# Patient Record
Sex: Female | Born: 1940 | Race: Black or African American | Hispanic: No | Smoking: Never smoker
Health system: Southern US, Community
[De-identification: ages and names within clinical notes are randomized; demographics above are authoritative.]

## PROBLEM LIST (undated history)

## (undated) DIAGNOSIS — N200 Calculus of kidney: Secondary | ICD-10-CM

## (undated) DIAGNOSIS — G4730 Sleep apnea, unspecified: Secondary | ICD-10-CM

## (undated) DIAGNOSIS — I351 Nonrheumatic aortic (valve) insufficiency: Secondary | ICD-10-CM

## (undated) DIAGNOSIS — G309 Alzheimer's disease, unspecified: Secondary | ICD-10-CM

## (undated) DIAGNOSIS — I5189 Other ill-defined heart diseases: Secondary | ICD-10-CM

## (undated) DIAGNOSIS — I10 Essential (primary) hypertension: Secondary | ICD-10-CM

## (undated) DIAGNOSIS — K219 Gastro-esophageal reflux disease without esophagitis: Secondary | ICD-10-CM

## (undated) DIAGNOSIS — F0280 Dementia in other diseases classified elsewhere without behavioral disturbance: Secondary | ICD-10-CM

## (undated) DIAGNOSIS — M1710 Unilateral primary osteoarthritis, unspecified knee: Secondary | ICD-10-CM

## (undated) DIAGNOSIS — I77810 Thoracic aortic ectasia: Secondary | ICD-10-CM

## (undated) DIAGNOSIS — I872 Venous insufficiency (chronic) (peripheral): Secondary | ICD-10-CM

## (undated) DIAGNOSIS — C50919 Malignant neoplasm of unspecified site of unspecified female breast: Secondary | ICD-10-CM

## (undated) DIAGNOSIS — R9431 Abnormal electrocardiogram [ECG] [EKG]: Secondary | ICD-10-CM

## (undated) HISTORY — DX: Gastro-esophageal reflux disease without esophagitis: K21.9

## (undated) HISTORY — DX: Malignant neoplasm of unspecified site of unspecified female breast: C50.919

## (undated) HISTORY — PX: MASTECTOMY: SHX3

## (undated) HISTORY — DX: Nonrheumatic aortic (valve) insufficiency: I35.1

## (undated) HISTORY — PX: KIDNEY STONE SURGERY: SHX686

## (undated) HISTORY — DX: Calculus of kidney: N20.0

## (undated) HISTORY — DX: Unilateral primary osteoarthritis, unspecified knee: M17.10

## (undated) HISTORY — PX: DILATION AND CURETTAGE OF UTERUS: SHX78

## (undated) HISTORY — DX: Sleep apnea, unspecified: G47.30

## (undated) HISTORY — DX: Thoracic aortic ectasia: I77.810

## (undated) HISTORY — DX: Abnormal electrocardiogram (ECG) (EKG): R94.31

## (undated) HISTORY — DX: Morbid (severe) obesity due to excess calories: E66.01

## (undated) HISTORY — PX: TRACHEOSTOMY: SUR1362

## (undated) HISTORY — DX: Essential (primary) hypertension: I10

## (undated) HISTORY — DX: Venous insufficiency (chronic) (peripheral): I87.2

## (undated) HISTORY — DX: Other ill-defined heart diseases: I51.89

## (undated) HISTORY — PX: ECTOPIC PREGNANCY SURGERY: SHX613

## (undated) HISTORY — DX: Alzheimer's disease, unspecified: G30.9

## (undated) HISTORY — DX: Dementia in other diseases classified elsewhere without behavioral disturbance: F02.80

---

## 1998-02-28 ENCOUNTER — Emergency Department (HOSPITAL_COMMUNITY): Admission: EM | Admit: 1998-02-28 | Discharge: 1998-02-28 | Payer: Self-pay | Admitting: Emergency Medicine

## 1998-03-29 ENCOUNTER — Other Ambulatory Visit: Admission: RE | Admit: 1998-03-29 | Discharge: 1998-03-29 | Payer: Self-pay | Admitting: Family Medicine

## 1999-02-11 ENCOUNTER — Emergency Department (HOSPITAL_COMMUNITY): Admission: EM | Admit: 1999-02-11 | Discharge: 1999-02-11 | Payer: Self-pay | Admitting: Emergency Medicine

## 1999-02-11 ENCOUNTER — Encounter: Payer: Self-pay | Admitting: Emergency Medicine

## 1999-02-12 ENCOUNTER — Ambulatory Visit (HOSPITAL_COMMUNITY): Admission: RE | Admit: 1999-02-12 | Discharge: 1999-02-12 | Payer: Self-pay | Admitting: Emergency Medicine

## 1999-02-25 ENCOUNTER — Ambulatory Visit (HOSPITAL_COMMUNITY): Admission: RE | Admit: 1999-02-25 | Discharge: 1999-02-25 | Payer: Self-pay | Admitting: Surgery

## 1999-02-25 ENCOUNTER — Encounter: Payer: Self-pay | Admitting: Surgery

## 1999-04-08 ENCOUNTER — Ambulatory Visit: Admission: RE | Admit: 1999-04-08 | Discharge: 1999-04-08 | Payer: Self-pay | Admitting: Urology

## 1999-04-08 ENCOUNTER — Encounter: Payer: Self-pay | Admitting: Urology

## 2000-03-24 ENCOUNTER — Encounter: Payer: Self-pay | Admitting: Urology

## 2000-03-24 ENCOUNTER — Ambulatory Visit (HOSPITAL_COMMUNITY): Admission: RE | Admit: 2000-03-24 | Discharge: 2000-03-24 | Payer: Self-pay | Admitting: Urology

## 2000-08-11 ENCOUNTER — Ambulatory Visit (HOSPITAL_COMMUNITY): Admission: RE | Admit: 2000-08-11 | Discharge: 2000-08-11 | Payer: Self-pay | Admitting: Family Medicine

## 2000-08-31 ENCOUNTER — Other Ambulatory Visit: Admission: RE | Admit: 2000-08-31 | Discharge: 2000-08-31 | Payer: Self-pay | Admitting: Family Medicine

## 2001-02-23 ENCOUNTER — Emergency Department (HOSPITAL_COMMUNITY): Admission: EM | Admit: 2001-02-23 | Discharge: 2001-02-23 | Payer: Self-pay | Admitting: Emergency Medicine

## 2001-03-14 ENCOUNTER — Ambulatory Visit (HOSPITAL_BASED_OUTPATIENT_CLINIC_OR_DEPARTMENT_OTHER): Admission: RE | Admit: 2001-03-14 | Discharge: 2001-03-14 | Payer: Self-pay | Admitting: Family Medicine

## 2001-03-30 ENCOUNTER — Emergency Department (HOSPITAL_COMMUNITY): Admission: EM | Admit: 2001-03-30 | Discharge: 2001-03-30 | Payer: Self-pay | Admitting: Emergency Medicine

## 2001-04-01 ENCOUNTER — Encounter: Payer: Self-pay | Admitting: Family Medicine

## 2001-04-01 ENCOUNTER — Ambulatory Visit (HOSPITAL_COMMUNITY): Admission: RE | Admit: 2001-04-01 | Discharge: 2001-04-01 | Payer: Self-pay | Admitting: Family Medicine

## 2001-04-04 ENCOUNTER — Ambulatory Visit (HOSPITAL_BASED_OUTPATIENT_CLINIC_OR_DEPARTMENT_OTHER): Admission: RE | Admit: 2001-04-04 | Discharge: 2001-04-04 | Payer: Self-pay | Admitting: Family Medicine

## 2001-08-10 ENCOUNTER — Encounter: Payer: Self-pay | Admitting: Emergency Medicine

## 2001-08-10 ENCOUNTER — Inpatient Hospital Stay (HOSPITAL_COMMUNITY): Admission: EM | Admit: 2001-08-10 | Discharge: 2001-08-18 | Payer: Self-pay | Admitting: Emergency Medicine

## 2001-08-16 ENCOUNTER — Encounter: Payer: Self-pay | Admitting: Internal Medicine

## 2001-09-14 ENCOUNTER — Ambulatory Visit (HOSPITAL_COMMUNITY): Admission: RE | Admit: 2001-09-14 | Discharge: 2001-09-14 | Payer: Self-pay | Admitting: Family Medicine

## 2001-09-28 ENCOUNTER — Encounter: Payer: Self-pay | Admitting: Family Medicine

## 2001-09-28 ENCOUNTER — Ambulatory Visit (HOSPITAL_COMMUNITY): Admission: RE | Admit: 2001-09-28 | Discharge: 2001-09-28 | Payer: Self-pay | Admitting: Family Medicine

## 2002-02-01 ENCOUNTER — Emergency Department (HOSPITAL_COMMUNITY): Admission: EM | Admit: 2002-02-01 | Discharge: 2002-02-01 | Payer: Self-pay | Admitting: Emergency Medicine

## 2003-06-23 ENCOUNTER — Encounter: Payer: Self-pay | Admitting: Internal Medicine

## 2003-06-23 ENCOUNTER — Encounter: Admission: RE | Admit: 2003-06-23 | Discharge: 2003-06-23 | Payer: Self-pay | Admitting: Internal Medicine

## 2003-10-06 ENCOUNTER — Encounter: Admission: RE | Admit: 2003-10-06 | Discharge: 2003-10-06 | Payer: Self-pay | Admitting: Internal Medicine

## 2003-10-06 ENCOUNTER — Encounter (INDEPENDENT_AMBULATORY_CARE_PROVIDER_SITE_OTHER): Payer: Self-pay | Admitting: *Deleted

## 2003-10-06 ENCOUNTER — Encounter (INDEPENDENT_AMBULATORY_CARE_PROVIDER_SITE_OTHER): Payer: Self-pay | Admitting: Radiology

## 2003-10-17 ENCOUNTER — Encounter (INDEPENDENT_AMBULATORY_CARE_PROVIDER_SITE_OTHER): Payer: Self-pay | Admitting: *Deleted

## 2003-10-17 ENCOUNTER — Ambulatory Visit (HOSPITAL_COMMUNITY): Admission: RE | Admit: 2003-10-17 | Discharge: 2003-10-17 | Payer: Self-pay | Admitting: General Surgery

## 2003-11-28 ENCOUNTER — Encounter (INDEPENDENT_AMBULATORY_CARE_PROVIDER_SITE_OTHER): Payer: Self-pay | Admitting: Specialist

## 2003-11-28 ENCOUNTER — Inpatient Hospital Stay (HOSPITAL_COMMUNITY): Admission: RE | Admit: 2003-11-28 | Discharge: 2003-11-30 | Payer: Self-pay | Admitting: General Surgery

## 2003-12-09 ENCOUNTER — Emergency Department (HOSPITAL_COMMUNITY): Admission: AD | Admit: 2003-12-09 | Discharge: 2003-12-10 | Payer: Self-pay | Admitting: Emergency Medicine

## 2003-12-09 ENCOUNTER — Emergency Department (HOSPITAL_COMMUNITY): Admission: EM | Admit: 2003-12-09 | Discharge: 2003-12-09 | Payer: Self-pay | Admitting: Emergency Medicine

## 2004-01-05 ENCOUNTER — Ambulatory Visit (HOSPITAL_COMMUNITY): Admission: RE | Admit: 2004-01-05 | Discharge: 2004-01-05 | Payer: Self-pay | Admitting: Oncology

## 2004-01-15 ENCOUNTER — Ambulatory Visit: Admission: RE | Admit: 2004-01-15 | Discharge: 2004-02-20 | Payer: Self-pay | Admitting: *Deleted

## 2004-01-23 ENCOUNTER — Encounter: Admission: RE | Admit: 2004-01-23 | Discharge: 2004-01-23 | Payer: Self-pay | Admitting: Oncology

## 2004-05-22 ENCOUNTER — Ambulatory Visit (HOSPITAL_COMMUNITY): Admission: RE | Admit: 2004-05-22 | Discharge: 2004-05-22 | Payer: Self-pay | Admitting: Oncology

## 2004-05-28 ENCOUNTER — Encounter: Admission: RE | Admit: 2004-05-28 | Discharge: 2004-07-19 | Payer: Self-pay | Admitting: Oncology

## 2004-08-06 ENCOUNTER — Emergency Department (HOSPITAL_COMMUNITY): Admission: EM | Admit: 2004-08-06 | Discharge: 2004-08-06 | Payer: Self-pay | Admitting: Emergency Medicine

## 2004-09-10 ENCOUNTER — Ambulatory Visit: Payer: Self-pay | Admitting: Oncology

## 2004-10-06 ENCOUNTER — Emergency Department (HOSPITAL_COMMUNITY): Admission: EM | Admit: 2004-10-06 | Discharge: 2004-10-06 | Payer: Self-pay | Admitting: Emergency Medicine

## 2004-10-07 ENCOUNTER — Encounter: Admission: RE | Admit: 2004-10-07 | Discharge: 2004-10-07 | Payer: Self-pay | Admitting: Oncology

## 2004-12-12 ENCOUNTER — Ambulatory Visit: Payer: Self-pay | Admitting: Oncology

## 2005-04-24 ENCOUNTER — Ambulatory Visit: Payer: Self-pay | Admitting: Oncology

## 2005-08-21 ENCOUNTER — Ambulatory Visit: Payer: Self-pay | Admitting: Oncology

## 2005-10-03 ENCOUNTER — Ambulatory Visit (HOSPITAL_COMMUNITY): Admission: RE | Admit: 2005-10-03 | Discharge: 2005-10-03 | Payer: Self-pay | Admitting: Gastroenterology

## 2005-11-20 ENCOUNTER — Encounter: Admission: RE | Admit: 2005-11-20 | Discharge: 2005-11-20 | Payer: Self-pay | Admitting: Oncology

## 2006-01-30 ENCOUNTER — Ambulatory Visit: Payer: Self-pay | Admitting: Oncology

## 2006-05-25 ENCOUNTER — Ambulatory Visit: Payer: Self-pay | Admitting: Oncology

## 2006-06-16 LAB — CBC WITH DIFFERENTIAL/PLATELET
Basophils Absolute: 0 10*3/uL (ref 0.0–0.1)
EOS%: 1.9 % (ref 0.0–7.0)
HCT: 41 % (ref 34.8–46.6)
HGB: 14.3 g/dL (ref 11.6–15.9)
LYMPH%: 26.4 % (ref 14.0–48.0)
MCH: 31.7 pg (ref 26.0–34.0)
MCV: 90.8 fL (ref 81.0–101.0)
MONO%: 10.4 % (ref 0.0–13.0)
NEUT%: 60.7 % (ref 39.6–76.8)

## 2006-06-17 LAB — COMPREHENSIVE METABOLIC PANEL
AST: 25 U/L (ref 0–37)
Alkaline Phosphatase: 91 U/L (ref 39–117)
BUN: 11 mg/dL (ref 6–23)
Calcium: 9.6 mg/dL (ref 8.4–10.5)
Creatinine, Ser: 0.77 mg/dL (ref 0.40–1.20)
Total Bilirubin: 1.2 mg/dL (ref 0.3–1.2)

## 2006-06-17 LAB — CANCER ANTIGEN 27.29: CA 27.29: 9 U/mL (ref 0–39)

## 2006-06-17 LAB — LACTATE DEHYDROGENASE: LDH: 186 U/L (ref 94–250)

## 2006-10-08 ENCOUNTER — Ambulatory Visit: Payer: Self-pay | Admitting: Oncology

## 2006-11-26 ENCOUNTER — Ambulatory Visit: Payer: Self-pay | Admitting: Oncology

## 2006-12-01 LAB — CBC WITH DIFFERENTIAL/PLATELET
BASO%: 0.6 % (ref 0.0–2.0)
Basophils Absolute: 0 10*3/uL (ref 0.0–0.1)
EOS%: 2.4 % (ref 0.0–7.0)
HGB: 14.1 g/dL (ref 11.6–15.9)
MCH: 31.4 pg (ref 26.0–34.0)
MCHC: 34 g/dL (ref 32.0–36.0)
RDW: 13 % (ref 11.3–14.5)
lymph#: 1.2 10*3/uL (ref 0.9–3.3)

## 2006-12-01 LAB — COMPREHENSIVE METABOLIC PANEL
ALT: 13 U/L (ref 0–35)
AST: 22 U/L (ref 0–37)
Albumin: 4.1 g/dL (ref 3.5–5.2)
Calcium: 9.4 mg/dL (ref 8.4–10.5)
Chloride: 104 mEq/L (ref 96–112)
Potassium: 3.9 mEq/L (ref 3.5–5.3)

## 2006-12-02 ENCOUNTER — Encounter: Admission: RE | Admit: 2006-12-02 | Discharge: 2006-12-02 | Payer: Self-pay | Admitting: Oncology

## 2007-01-25 ENCOUNTER — Encounter: Admission: RE | Admit: 2007-01-25 | Discharge: 2007-01-25 | Payer: Self-pay | Admitting: Oncology

## 2007-03-22 ENCOUNTER — Ambulatory Visit: Payer: Self-pay | Admitting: Oncology

## 2007-03-31 LAB — CBC WITH DIFFERENTIAL/PLATELET
Eosinophils Absolute: 0.1 10*3/uL (ref 0.0–0.5)
LYMPH%: 22.5 % (ref 14.0–48.0)
MCH: 31.7 pg (ref 26.0–34.0)
MCV: 89.6 fL (ref 81.0–101.0)
MONO%: 9.7 % (ref 0.0–13.0)
NEUT#: 3 10*3/uL (ref 1.5–6.5)
Platelets: 225 10*3/uL (ref 145–400)
RBC: 4.41 10*6/uL (ref 3.70–5.32)

## 2007-03-31 LAB — COMPREHENSIVE METABOLIC PANEL
Alkaline Phosphatase: 83 U/L (ref 39–117)
BUN: 14 mg/dL (ref 6–23)
Glucose, Bld: 117 mg/dL — ABNORMAL HIGH (ref 70–99)
Sodium: 141 mEq/L (ref 135–145)
Total Bilirubin: 0.9 mg/dL (ref 0.3–1.2)
Total Protein: 6.7 g/dL (ref 6.0–8.3)

## 2007-03-31 LAB — CANCER ANTIGEN 27.29: CA 27.29: 10 U/mL (ref 0–39)

## 2007-05-17 DIAGNOSIS — F0280 Dementia in other diseases classified elsewhere without behavioral disturbance: Secondary | ICD-10-CM

## 2007-05-17 DIAGNOSIS — G309 Alzheimer's disease, unspecified: Secondary | ICD-10-CM

## 2007-05-17 HISTORY — DX: Dementia in other diseases classified elsewhere, unspecified severity, without behavioral disturbance, psychotic disturbance, mood disturbance, and anxiety: F02.80

## 2007-09-17 ENCOUNTER — Ambulatory Visit: Payer: Self-pay | Admitting: Oncology

## 2007-09-22 LAB — CBC WITH DIFFERENTIAL/PLATELET
Basophils Absolute: 0 10*3/uL (ref 0.0–0.1)
Eosinophils Absolute: 0.1 10*3/uL (ref 0.0–0.5)
HCT: 40.5 % (ref 34.8–46.6)
HGB: 14.2 g/dL (ref 11.6–15.9)
MONO#: 0.4 10*3/uL (ref 0.1–0.9)
NEUT%: 57.5 % (ref 39.6–76.8)
WBC: 4.9 10*3/uL (ref 3.9–10.0)
lymph#: 1.5 10*3/uL (ref 0.9–3.3)

## 2007-09-22 LAB — LACTATE DEHYDROGENASE: LDH: 164 U/L (ref 94–250)

## 2007-09-22 LAB — COMPREHENSIVE METABOLIC PANEL
Albumin: 4 g/dL (ref 3.5–5.2)
CO2: 23 mEq/L (ref 19–32)
Glucose, Bld: 118 mg/dL — ABNORMAL HIGH (ref 70–99)
Potassium: 3.7 mEq/L (ref 3.5–5.3)
Sodium: 139 mEq/L (ref 135–145)
Total Protein: 6.9 g/dL (ref 6.0–8.3)

## 2007-09-22 LAB — CANCER ANTIGEN 27.29: CA 27.29: 10 U/mL (ref 0–39)

## 2007-12-07 ENCOUNTER — Encounter: Admission: RE | Admit: 2007-12-07 | Discharge: 2007-12-07 | Payer: Self-pay | Admitting: Oncology

## 2008-04-26 ENCOUNTER — Ambulatory Visit: Payer: Self-pay | Admitting: Oncology

## 2008-04-28 LAB — CBC WITH DIFFERENTIAL/PLATELET
Eosinophils Absolute: 0.1 10*3/uL (ref 0.0–0.5)
MCV: 90.4 fL (ref 81.0–101.0)
MONO%: 8.7 % (ref 0.0–13.0)
NEUT#: 3.3 10*3/uL (ref 1.5–6.5)
RBC: 4.47 10*6/uL (ref 3.70–5.32)
RDW: 12.2 % (ref 11.3–14.5)
WBC: 6.2 10*3/uL (ref 3.9–10.0)
lymph#: 2.2 10*3/uL (ref 0.9–3.3)

## 2008-05-01 LAB — COMPREHENSIVE METABOLIC PANEL
AST: 21 U/L (ref 0–37)
Albumin: 3.8 g/dL (ref 3.5–5.2)
Alkaline Phosphatase: 84 U/L (ref 39–117)
Calcium: 9 mg/dL (ref 8.4–10.5)
Chloride: 105 mEq/L (ref 96–112)
Glucose, Bld: 101 mg/dL — ABNORMAL HIGH (ref 70–99)
Potassium: 3.7 mEq/L (ref 3.5–5.3)
Sodium: 139 mEq/L (ref 135–145)
Total Protein: 6.6 g/dL (ref 6.0–8.3)

## 2008-05-16 ENCOUNTER — Emergency Department (HOSPITAL_COMMUNITY): Admission: EM | Admit: 2008-05-16 | Discharge: 2008-05-17 | Payer: Self-pay | Admitting: Emergency Medicine

## 2008-06-29 ENCOUNTER — Encounter: Admission: RE | Admit: 2008-06-29 | Discharge: 2008-09-11 | Payer: Self-pay | Admitting: Orthopaedic Surgery

## 2008-10-23 ENCOUNTER — Ambulatory Visit: Payer: Self-pay | Admitting: Oncology

## 2008-10-25 LAB — CBC WITH DIFFERENTIAL/PLATELET
Basophils Absolute: 0 10*3/uL (ref 0.0–0.1)
Eosinophils Absolute: 0.1 10*3/uL (ref 0.0–0.5)
HCT: 39.9 % (ref 34.8–46.6)
LYMPH%: 38.6 % (ref 14.0–48.0)
MCV: 91.8 fL (ref 81.0–101.0)
MONO#: 0.4 10*3/uL (ref 0.1–0.9)
MONO%: 8.2 % (ref 0.0–13.0)
NEUT#: 2.5 10*3/uL (ref 1.5–6.5)
NEUT%: 50.6 % (ref 39.6–76.8)
Platelets: 198 10*3/uL (ref 145–400)
WBC: 4.9 10*3/uL (ref 3.9–10.0)

## 2008-10-26 LAB — COMPREHENSIVE METABOLIC PANEL
Alkaline Phosphatase: 76 U/L (ref 39–117)
BUN: 11 mg/dL (ref 6–23)
CO2: 24 mEq/L (ref 19–32)
Creatinine, Ser: 0.66 mg/dL (ref 0.40–1.20)
Glucose, Bld: 124 mg/dL — ABNORMAL HIGH (ref 70–99)
Total Bilirubin: 0.8 mg/dL (ref 0.3–1.2)
Total Protein: 6.7 g/dL (ref 6.0–8.3)

## 2008-10-26 LAB — CANCER ANTIGEN 27.29: CA 27.29: 9 U/mL (ref 0–39)

## 2008-10-26 LAB — VITAMIN D 25 HYDROXY (VIT D DEFICIENCY, FRACTURES): Vit D, 25-Hydroxy: 43 ng/mL (ref 30–89)

## 2008-10-26 LAB — LACTATE DEHYDROGENASE: LDH: 143 U/L (ref 94–250)

## 2008-12-15 ENCOUNTER — Ambulatory Visit (HOSPITAL_COMMUNITY): Admission: RE | Admit: 2008-12-15 | Discharge: 2008-12-15 | Payer: Self-pay | Admitting: Internal Medicine

## 2009-10-24 ENCOUNTER — Ambulatory Visit: Payer: Self-pay | Admitting: Oncology

## 2009-10-30 LAB — COMPREHENSIVE METABOLIC PANEL
ALT: 18 U/L (ref 0–35)
AST: 31 U/L (ref 0–37)
CO2: 29 mEq/L (ref 19–32)
Chloride: 101 mEq/L (ref 96–112)
Creatinine, Ser: 0.74 mg/dL (ref 0.40–1.20)
Sodium: 136 mEq/L (ref 135–145)
Total Bilirubin: 1.1 mg/dL (ref 0.3–1.2)
Total Protein: 6.5 g/dL (ref 6.0–8.3)

## 2009-10-30 LAB — CBC WITH DIFFERENTIAL/PLATELET
BASO%: 0.6 % (ref 0.0–2.0)
EOS%: 1.8 % (ref 0.0–7.0)
LYMPH%: 51.4 % — ABNORMAL HIGH (ref 14.0–49.7)
MCH: 31.6 pg (ref 25.1–34.0)
MCHC: 34 g/dL (ref 31.5–36.0)
MONO#: 0.4 10*3/uL (ref 0.1–0.9)
NEUT%: 35.5 % — ABNORMAL LOW (ref 38.4–76.8)
RBC: 4.48 10*6/uL (ref 3.70–5.45)
WBC: 3.4 10*3/uL — ABNORMAL LOW (ref 3.9–10.3)
lymph#: 1.8 10*3/uL (ref 0.9–3.3)

## 2009-10-30 LAB — LACTATE DEHYDROGENASE: LDH: 148 U/L (ref 94–250)

## 2009-12-17 ENCOUNTER — Ambulatory Visit (HOSPITAL_COMMUNITY): Admission: RE | Admit: 2009-12-17 | Discharge: 2009-12-17 | Payer: Self-pay | Admitting: Oncology

## 2010-10-30 ENCOUNTER — Ambulatory Visit (HOSPITAL_BASED_OUTPATIENT_CLINIC_OR_DEPARTMENT_OTHER): Payer: Medicare Other | Admitting: Oncology

## 2010-11-23 ENCOUNTER — Encounter: Payer: Self-pay | Admitting: Oncology

## 2010-11-23 ENCOUNTER — Encounter: Payer: Self-pay | Admitting: General Surgery

## 2010-11-23 ENCOUNTER — Encounter: Payer: Self-pay | Admitting: Internal Medicine

## 2010-11-24 ENCOUNTER — Encounter: Payer: Self-pay | Admitting: Internal Medicine

## 2010-11-29 ENCOUNTER — Other Ambulatory Visit: Payer: Self-pay | Admitting: Oncology

## 2010-11-29 DIAGNOSIS — Z1231 Encounter for screening mammogram for malignant neoplasm of breast: Secondary | ICD-10-CM

## 2010-11-29 DIAGNOSIS — Z139 Encounter for screening, unspecified: Secondary | ICD-10-CM

## 2010-12-20 ENCOUNTER — Ambulatory Visit (HOSPITAL_COMMUNITY): Admission: RE | Admit: 2010-12-20 | Payer: Medicare Other | Source: Ambulatory Visit

## 2010-12-23 ENCOUNTER — Encounter (HOSPITAL_COMMUNITY): Payer: Self-pay

## 2010-12-23 ENCOUNTER — Ambulatory Visit (HOSPITAL_COMMUNITY)
Admission: RE | Admit: 2010-12-23 | Discharge: 2010-12-23 | Disposition: A | Discharge status: Home / Self Care | Payer: Medicare Other | Source: Ambulatory Visit | Attending: Oncology | Admitting: Oncology

## 2010-12-23 DIAGNOSIS — Z1231 Encounter for screening mammogram for malignant neoplasm of breast: Secondary | ICD-10-CM | POA: Insufficient documentation

## 2011-01-09 ENCOUNTER — Encounter (HOSPITAL_BASED_OUTPATIENT_CLINIC_OR_DEPARTMENT_OTHER): Payer: Medicare Other | Admitting: Oncology

## 2011-01-09 ENCOUNTER — Other Ambulatory Visit: Payer: Self-pay | Admitting: Oncology

## 2011-01-09 DIAGNOSIS — Z170 Estrogen receptor positive status [ER+]: Secondary | ICD-10-CM

## 2011-01-09 DIAGNOSIS — C50219 Malignant neoplasm of upper-inner quadrant of unspecified female breast: Secondary | ICD-10-CM

## 2011-01-09 DIAGNOSIS — E559 Vitamin D deficiency, unspecified: Secondary | ICD-10-CM

## 2011-01-09 DIAGNOSIS — Z9010 Acquired absence of unspecified breast and nipple: Secondary | ICD-10-CM

## 2011-01-09 LAB — CBC WITH DIFFERENTIAL/PLATELET
BASO%: 1 % (ref 0.0–2.0)
Basophils Absolute: 0 10*3/uL (ref 0.0–0.1)
EOS%: 1.6 % (ref 0.0–7.0)
Eosinophils Absolute: 0.1 10*3/uL (ref 0.0–0.5)
HCT: 41.1 % (ref 34.8–46.6)
HGB: 13.9 g/dL (ref 11.6–15.9)
LYMPH%: 36.8 % (ref 14.0–49.7)
MCH: 31.4 pg (ref 25.1–34.0)
MCHC: 33.9 g/dL (ref 31.5–36.0)
MCV: 92.7 fL (ref 79.5–101.0)
MONO#: 0.4 10*3/uL (ref 0.1–0.9)
MONO%: 8.7 % (ref 0.0–14.0)
NEUT#: 2.5 10*3/uL (ref 1.5–6.5)
NEUT%: 51.9 % (ref 38.4–76.8)
Platelets: 243 10*3/uL (ref 145–400)
RBC: 4.44 10*6/uL (ref 3.70–5.45)
RDW: 13.2 % (ref 11.2–14.5)
WBC: 4.7 10*3/uL (ref 3.9–10.3)
lymph#: 1.7 10*3/uL (ref 0.9–3.3)

## 2011-01-10 LAB — COMPREHENSIVE METABOLIC PANEL
ALT: 12 U/L (ref 0–35)
AST: 21 U/L (ref 0–37)
Albumin: 3.8 g/dL (ref 3.5–5.2)
Alkaline Phosphatase: 65 U/L (ref 39–117)
Glucose, Bld: 88 mg/dL (ref 70–99)
Potassium: 3.7 mEq/L (ref 3.5–5.3)
Sodium: 141 mEq/L (ref 135–145)
Total Protein: 6.1 g/dL (ref 6.0–8.3)

## 2011-01-10 LAB — CANCER ANTIGEN 27.29: CA 27.29: 10 U/mL (ref 0–39)

## 2011-01-15 ENCOUNTER — Other Ambulatory Visit (HOSPITAL_COMMUNITY): Payer: Self-pay | Admitting: Obstetrics

## 2011-01-15 DIAGNOSIS — E669 Obesity, unspecified: Secondary | ICD-10-CM

## 2011-01-22 ENCOUNTER — Ambulatory Visit (HOSPITAL_COMMUNITY)
Admission: RE | Admit: 2011-01-22 | Discharge: 2011-01-22 | Disposition: A | Discharge status: Home / Self Care | Payer: Medicare Other | Source: Ambulatory Visit | Attending: Obstetrics | Admitting: Obstetrics

## 2011-01-22 ENCOUNTER — Other Ambulatory Visit (HOSPITAL_COMMUNITY): Payer: Self-pay | Admitting: Obstetrics

## 2011-01-22 DIAGNOSIS — E669 Obesity, unspecified: Secondary | ICD-10-CM

## 2011-01-22 DIAGNOSIS — D250 Submucous leiomyoma of uterus: Secondary | ICD-10-CM | POA: Insufficient documentation

## 2011-01-22 DIAGNOSIS — N949 Unspecified condition associated with female genital organs and menstrual cycle: Secondary | ICD-10-CM | POA: Insufficient documentation

## 2011-03-21 NOTE — Discharge Summary (Signed)
[place]. [facility]  Patient:    Carol Martin, Carol Martin Visit Number: [id number] MRN: [id number]          Service Type: MED Location: 5500 [id number] Attending Physician:  Madaline Guthrie Dictated by:   Arlis Porta, M.D. Admit Date:  08/10/2001 Discharge Date: 08/18/2001                             Discharge Summary  ADDENDUM  PROCEDURES AND PERTINENT LABORATORY DATA:  The patient had an abdominal and pelvic CT done on August 10, 2001 which showed: 1. Bilateral renal calculi with atrophic changes of the lower pole of the left    kidney. 2. Hydronephrotic changes of the left kidney, questionably secondary to    calculi in the proximal left ureter. 3. Para-aortic adenopathy. 4. Negative CT of the pelvis. Dictated by:   Arlis Porta, M.D. Attending Physician:  Madaline Guthrie DD:  08/18/01 TD:  08/19/01 Job: [id number]

## 2011-03-21 NOTE — Consult Note (Signed)
[place]. [facility]  Patient:    Carol Martin, Carol Martin Visit Number: [id number] MRN: [id number]          Service Type: MED Location: 5500 [id number] Attending Physician:  Madaline Guthrie Dictated by:   Lucrezia Starch. Ovidio Hanger, M.D. Proc. Date: 08/10/01 Admit Date:  08/10/2001   CC:         Medical Teaching Service attending Dr. Wyonia Hough   Consultation Report  CHIEF COMPLAINT:  "I feel terrible."  HISTORY OF PRESENT ILLNESS:  This 70 year old black female presented with fever and chills.  She has a history of renal stone disease in the past, morbid obesity, and sleep apnea.  Has been followed by Dr. [place] Service in the past.  She was referred to [facility] by Dr. Wanda Plump and underwent stone removal at [facility] approximately one year ago and has been followed there since.  Additionally, she has had mild left flank pain, but has developed a severe leg cellulitis with chills and has noticed some hematuria. CT scan tonight.  She is, of note, morbidly obese, and a CT scan tonight revealed right nephrolithiasis and large retroperitoneal lymph nodes, and probable left upper ureteral stone, and questionable left hydronephrosis. There is no perinephric stranding or abscess noted.  PAST MEDICAL HISTORY: 1. Sleep apnea. 2. Morbid obesity. 3. Multiple medical problems, hypertension. 4. Nephrolithiasis as noted.  PAST SURGICAL HISTORY: 1. Urinary tract surgery as noted. 2. Tracheostomy previously.  ALLERGIES:  No current information or allergic status.  REVIEW OF SYSTEMS:  She has no shortness of breath, current dyspnea on exertion, chest pain, or gastrointestinal complaints.  She does have a swelling in her leg and tenderness as noted, and does have the mild left flank pain and fever and chills.  PHYSICAL EXAMINATION:  VITAL SIGNS:  Temperature 104.0 degrees Fahrenheit, pulse 118, blood pressure 145/80, respiratory rate 20, oxygen saturation  95%.  GENERAL:  She is morbidly obese, in no current acute distress.  She is oriented x 3.  HEENT:  Pupils are equal, round and reactive to light and accommodation.  Nose and throat clear.  NECK:  Without masses, thyromegaly, or bruits.  CHEST:  Clear, symmetric anteriorly and posteriorly.  HEART:  Rapid regular rate without obvious murmurs or gallops.  ABDOMEN:  Soft, massively obese.  There is very mild left CVA tenderness.  No inguinal adenopathy is noted.  SKIN:  She has significant left lower limb erythema and warmth from the cellulitis.  NEUROLOGIC:  Appears to be intact.  LABORATORY DATA:  Urinalysis had moderate blood, negative nitrite, negative leukocyte esterase.  There were 0 to 2 white cells, 7 to 10 red cells per high powered field and rare bacteria.  White blood cell count was 10.1 with 87% neutrophils, hemoglobin 14.4, hematocrit 41.9.  IMPRESSION:  I suspect her fever is most likely from the cellulitis and her renal situation may be chronic in nature.  No inflammatory renal changes are noted.  PLAN:  IV fluids, antibiotics.  Dr. Wanda Plump will follow up with the patient in the morning.  Consider a CT scan with contrast at that time. Dictated by:   Lucrezia Starch. Ovidio Hanger, M.D. Attending Physician:  Madaline Guthrie DD:  08/10/01 TD:  08/11/01 Job: [id number]

## 2011-03-21 NOTE — Op Note (Signed)
NAMEALLAN, BACIGALUPI NO.:  [id number]   MEDICAL RECORD NO.:  [id number]                   PATIENT TYPE:  INP   LOCATION:  2899                                 FACILITY:  [facility]   PHYSICIAN:  Rose Phi. Maple Hudson, M.D.                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   OPERATION:  Right total mastectomy.   SURGEON:  Rose Phi. Maple Hudson, M.D.   ANESTHESIA:  General.   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   After suitable general anesthesia was induced, the patient was placed in a  supine position with both arms extended on the arm board.  A transverse  elliptical incision incorporating the nipple areolar contents and the  previous lumpectomy site as well as the redundant skin was then outlined and  made and we dissected the superior flap to the clavicle and then medially to  the sternum and laterally to the latissimus dorsi muscle and inferiorly to  the rectus fascia.  We then removed the breast by dissecting from medial to  lateral incorporating the breast tissue also lateral to the pectoralis major  muscle.  Following removal of this, we obtained good hemostasis with the  cautery.  We thoroughly irrigated the field with saline.  Two 25 French  Blake drains were inserted and brought out through separate stab wounds.  The skin was stapled.  Dressings were applied.  The patient was   PREOPERATIVE DIAGNOSIS:  Stage II carcinoma of the left breast.   POSTOPERATIVE DIAGNOSIS:  Stage II  carcinoma of the left breast.   OPERATION:  Insertion of Port-A-Cath.   SURGEON:  Rose Phi. Maple Hudson, M.D.   ANESTHESIA:  MAC.   OPERATIVE PROCEDURE:  The patient was placed on the operating table with a  roll between her shoulders and the right upper chest and neck prepped and  draped in the usual fashion.  Under local anesthesia, a right subclavian  puncture was carried out without difficulty and a guide-wire inserted and  proper positioning confirmed by fluoroscopy.  We then made an incision on  the anterior chest wall and developed a pocket for the implantable port.  The catheter was tunneled between the subclavian puncture site and the port  and we passed the catheter through there, attached it to the port, and then  sutured it in place to the new pocket with two 2-0 Prolene sutures.  A  dilator and peel away sheath were then passed over the wire and had to trim  the catheter tip to fit to the fourth interspace.  We then removed the wire  and dilator and passed the catheter through the peel away sheath and then  removed it.  The proper positioning of the catheter tip in the superior vena  cava was confirmed by fluoroscopy and there was no kinking in the system and  it easily aspirated.  The incision was closed with 3-0 Vicryl, subcuticular  4-0 Monocryl, and Steri-Strips.  The system was accessed with the right  angle Huber needle with catheter attached and the system was flushed, easily  aspirated, then fully heparinized.  Dressings were applied.  The patient  transferred to the recovery room in satisfactory condition, having tolerated  the procedure well.                                               Rose Phi. Maple Hudson, M.D.    PRY/MEDQ  D:  11/28/2003  T:  11/28/2003  Job:  [id number]

## 2011-03-26 ENCOUNTER — Encounter (HOSPITAL_COMMUNITY): Payer: Medicare Other

## 2011-03-26 ENCOUNTER — Other Ambulatory Visit: Payer: Self-pay | Admitting: Obstetrics

## 2011-03-26 LAB — URINE MICROSCOPIC-ADD ON

## 2011-03-26 LAB — URINALYSIS, ROUTINE W REFLEX MICROSCOPIC
Bilirubin Urine: NEGATIVE
Glucose, UA: NEGATIVE mg/dL
Hgb urine dipstick: NEGATIVE
Protein, ur: NEGATIVE mg/dL
Specific Gravity, Urine: 1.015 (ref 1.005–1.030)
Urobilinogen, UA: 0.2 mg/dL (ref 0.0–1.0)

## 2011-03-26 LAB — BASIC METABOLIC PANEL
Calcium: 9.3 mg/dL (ref 8.4–10.5)
GFR calc Af Amer: >60 mL/min (ref >60)
GFR calc non Af Amer: >60 mL/min (ref >60)
Glucose, Bld: 89 mg/dL (ref 70–99)
Potassium: 3.3 mEq/L — ABNORMAL LOW (ref 3.5–5.1)
Sodium: 140 mEq/L (ref 135–145)

## 2011-03-26 LAB — CBC
HCT: 42.7 % (ref 36.0–46.0)
Hemoglobin: 14.1 g/dL (ref 12.0–15.0)
MCH: 30.7 pg (ref 26.0–34.0)
MCHC: 33 g/dL (ref 30.0–36.0)
MCV: 92.8 fL (ref 78.0–100.0)
RDW: 13 % (ref 11.5–15.5)

## 2011-04-02 ENCOUNTER — Ambulatory Visit (HOSPITAL_COMMUNITY)
Admission: RE | Admit: 2011-04-02 | Discharge: 2011-04-02 | Disposition: A | Discharge status: Home / Self Care | Payer: Medicare Other | Source: Ambulatory Visit | Attending: Obstetrics | Admitting: Obstetrics

## 2011-04-02 DIAGNOSIS — Z5309 Procedure and treatment not carried out because of other contraindication: Secondary | ICD-10-CM | POA: Insufficient documentation

## 2011-04-02 DIAGNOSIS — R079 Chest pain, unspecified: Secondary | ICD-10-CM | POA: Insufficient documentation

## 2011-04-02 DIAGNOSIS — N840 Polyp of corpus uteri: Secondary | ICD-10-CM | POA: Insufficient documentation

## 2011-05-15 ENCOUNTER — Encounter: Payer: Self-pay | Admitting: Internal Medicine

## 2011-05-16 ENCOUNTER — Encounter: Payer: Self-pay | Admitting: Internal Medicine

## 2011-05-16 ENCOUNTER — Ambulatory Visit (INDEPENDENT_AMBULATORY_CARE_PROVIDER_SITE_OTHER): Payer: Medicare Other | Admitting: Internal Medicine

## 2011-05-16 DIAGNOSIS — I10 Essential (primary) hypertension: Secondary | ICD-10-CM

## 2011-05-16 DIAGNOSIS — R0600 Dyspnea, unspecified: Secondary | ICD-10-CM

## 2011-05-16 DIAGNOSIS — Z0000 Encounter for general adult medical examination without abnormal findings: Secondary | ICD-10-CM

## 2011-05-16 DIAGNOSIS — I2510 Atherosclerotic heart disease of native coronary artery without angina pectoris: Secondary | ICD-10-CM

## 2011-05-16 DIAGNOSIS — R0989 Other specified symptoms and signs involving the circulatory and respiratory systems: Secondary | ICD-10-CM

## 2011-05-16 DIAGNOSIS — R0609 Other forms of dyspnea: Secondary | ICD-10-CM

## 2011-05-16 DIAGNOSIS — R9431 Abnormal electrocardiogram [ECG] [EKG]: Secondary | ICD-10-CM

## 2011-05-16 NOTE — Patient Instructions (Signed)
Your physician has requested that you have an echocardiogram. Echocardiography is a painless test that uses sound waves to create images of your heart. It provides your doctor with information about the size and shape of your heart and how well your heart's chambers and valves are working. This procedure takes approximately one hour. There are no restrictions for this procedure.  Fasting Lipid panel the day of the echo. Will call you with results.

## 2011-05-18 DIAGNOSIS — I10 Essential (primary) hypertension: Secondary | ICD-10-CM | POA: Insufficient documentation

## 2011-05-18 DIAGNOSIS — R0600 Dyspnea, unspecified: Secondary | ICD-10-CM | POA: Insufficient documentation

## 2011-05-18 DIAGNOSIS — R9431 Abnormal electrocardiogram [ECG] [EKG]: Secondary | ICD-10-CM | POA: Insufficient documentation

## 2011-05-18 DIAGNOSIS — Z0000 Encounter for general adult medical examination without abnormal findings: Secondary | ICD-10-CM | POA: Insufficient documentation

## 2011-05-18 NOTE — Progress Notes (Signed)
HPI  Patient is a 70 year old who was referred for evaluation of an abnormal EKG The patient was told she had an enlarged heart 40 years ago. She was seen recently in Dr. Olen Pel office.  EKG was done but not sent The patient denies chest pains   She does note SOB and giving out with just walking.  Not sure if it is due to her weight. Denies PND.  No palpitations.   No Known Allergies  Current Outpatient Prescriptions  Medication Sig Dispense Refill  . lisinopril-hydrochlorothiazide (PRINZIDE,ZESTORETIC) 20-12.5 MG per tablet Take 1 tablet by mouth daily.        . Multiple Vitamins-Minerals (CENTRUM SILVER PO) Take by mouth daily.        . naproxen (NAPROSYN) 500 MG tablet Take 500 mg by mouth as needed.        Marland Kitchen omeprazole (PRILOSEC) 20 MG capsule Take 20 mg by mouth daily.        . verapamil (COVERA HS) 180 MG (CO) 24 hr tablet Take 180 mg by mouth at bedtime.          Past Medical History  Diagnosis Date  . Abnormal EKG   . Venous (peripheral) insufficiency   . Hypertension   . Kidney stones   . Arthritis of knee   . Sleep apnea     with insomnia  . Morbid obesity   . Breast cancer   . Alzheimer's disease 05/17/07  . GERD (gastroesophageal reflux disease)   . Allergic rhinitis     Past Surgical History  Procedure Date  . Ectopic pregnancy surgery   . Cesarean section   . Kidney stone surgery     REMOVAL  . Mastectomy     No family history on file.  History   Social History  . Marital Status: Widowed    Spouse Name: N/A    Number of Children: N/A  . Years of Education: N/A   Occupational History  . Not on file.   Social History Main Topics  . Smoking status: Never Smoker   . Smokeless tobacco: Not on file  . Alcohol Use: No  . Drug Use: No  . Sexually Active: Not on file   Other Topics Concern  . Not on file   Social History Narrative  . No narrative on file    Review of Systems:  All systems reviewed.  They are negative to the above problem  except as previously stated.  Vital Signs: BP 139/94  Pulse 87  Ht 5\' 1"  (1.549 m)  Wt 296 lb (134.265 kg)  BMI 55.93 kg/m2  Physical Exam  HEENT:  Normocephalic, atraumatic. EOMI, PERRLA.  Neck: JVP is normal. No thyromegaly. No bruits.  Lungs: clear to auscultation. No rales no wheezes.  Heart: Regular rate and rhythm. Normal S1, S2. No S3.   No significant murmurs. PMI not displaced.  Abdomen:  Supple, nontender. Normal bowel sounds. No masses. No hepatomegaly.  Extremities:   Good distal pulses throughout. No lower extremity edema.  Musculoskeletal :moving all extremities.  Neuro:   alert and oriented x3.  CN II-XII grossly intact.   EKG:  SR.  82 bpm.  LVH with strain pattern.   Assessment and Plan:

## 2011-05-18 NOTE — Assessment & Plan Note (Signed)
BP is a little high.  Will need to be followed.

## 2011-05-18 NOTE — Assessment & Plan Note (Signed)
Concerning to me is the patient's history of dyspnea with exertion.  She does have risks for CAD I would recommend a myoview to ru

## 2011-05-18 NOTE — Assessment & Plan Note (Signed)
Will set up for fasting lipids. 

## 2011-05-18 NOTE — Assessment & Plan Note (Addendum)
Would recomm echo to evaluate.  EKG sugg LVH with strain.

## 2011-05-22 ENCOUNTER — Ambulatory Visit (HOSPITAL_COMMUNITY): Payer: Medicare Other | Attending: Internal Medicine

## 2011-05-22 ENCOUNTER — Other Ambulatory Visit (INDEPENDENT_AMBULATORY_CARE_PROVIDER_SITE_OTHER): Payer: Medicare Other | Admitting: *Deleted

## 2011-05-22 DIAGNOSIS — R072 Precordial pain: Secondary | ICD-10-CM | POA: Insufficient documentation

## 2011-05-22 DIAGNOSIS — R0609 Other forms of dyspnea: Secondary | ICD-10-CM

## 2011-05-22 DIAGNOSIS — J90 Pleural effusion, not elsewhere classified: Secondary | ICD-10-CM | POA: Insufficient documentation

## 2011-05-22 DIAGNOSIS — I079 Rheumatic tricuspid valve disease, unspecified: Secondary | ICD-10-CM | POA: Insufficient documentation

## 2011-05-22 DIAGNOSIS — R0989 Other specified symptoms and signs involving the circulatory and respiratory systems: Secondary | ICD-10-CM

## 2011-05-22 DIAGNOSIS — I059 Rheumatic mitral valve disease, unspecified: Secondary | ICD-10-CM | POA: Insufficient documentation

## 2011-05-22 DIAGNOSIS — I2510 Atherosclerotic heart disease of native coronary artery without angina pectoris: Secondary | ICD-10-CM

## 2011-05-22 DIAGNOSIS — I10 Essential (primary) hypertension: Secondary | ICD-10-CM | POA: Insufficient documentation

## 2011-05-22 DIAGNOSIS — R9431 Abnormal electrocardiogram [ECG] [EKG]: Secondary | ICD-10-CM | POA: Insufficient documentation

## 2011-05-22 LAB — LIPID PANEL
HDL: 50.8 mg/dL (ref >39.00)
LDL Cholesterol: 61 mg/dL (ref 0–99)
Total CHOL/HDL Ratio: 2
Triglycerides: 60 mg/dL (ref 0.0–149.0)
VLDL: 12 mg/dL (ref 0.0–40.0)

## 2011-06-20 ENCOUNTER — Encounter: Payer: Self-pay | Admitting: Internal Medicine

## 2011-07-31 LAB — POCT I-STAT, CHEM 8
Calcium, Ion: 1.19
HCT: 42
TCO2: 29

## 2011-11-14 ENCOUNTER — Telehealth: Payer: Self-pay | Admitting: *Deleted

## 2011-11-14 NOTE — Telephone Encounter (Signed)
Dtr. Cyndia Bent called.  She would like Dr. Donnie Coffin to see her mother. She doesn't have a follow up appt.presently.  Pt. Saw  PCP Dr. Constance Holster yesterday and he did lab work, but they havent heard from it yet.  Pt. Is having more fatigue.  Pt. Is due mammograms in February and then to see Dr. Donnie Coffin in March '13 for yearly visit.  Dtr. Said that PCP said they should see Dr. Donnie Coffin "to see if her breast cancer has come back or to see why she is more tired."   Let them know that they should send copy of lab work done at PCP to Dr. Donnie Coffin.  Note given to [place] to go ahead and schedule pt. For yearly follow-up.

## 2011-11-17 ENCOUNTER — Telehealth: Payer: Self-pay | Admitting: Oncology

## 2011-11-17 NOTE — Telephone Encounter (Signed)
pts daugter called and scheduled pts appt for feb2013 and I also provided mammogram appt for 12/25/2011 @ 2pm @ [facility]

## 2011-12-25 ENCOUNTER — Ambulatory Visit: Payer: Medicare Other

## 2011-12-29 ENCOUNTER — Telehealth: Payer: Self-pay | Admitting: Oncology

## 2011-12-29 NOTE — Telephone Encounter (Signed)
S/w the pt and she is aware of her r/s feb appts to march due to the md's schedule

## 2012-01-01 ENCOUNTER — Other Ambulatory Visit: Payer: Medicare Other

## 2012-01-01 ENCOUNTER — Ambulatory Visit: Payer: Medicare Other | Admitting: Oncology

## 2012-01-06 ENCOUNTER — Other Ambulatory Visit: Payer: Self-pay

## 2012-01-06 ENCOUNTER — Other Ambulatory Visit: Payer: Medicare Other | Admitting: Lab

## 2012-01-06 ENCOUNTER — Ambulatory Visit: Payer: Medicare Other | Admitting: Physician Assistant

## 2012-01-06 DIAGNOSIS — E559 Vitamin D deficiency, unspecified: Secondary | ICD-10-CM

## 2012-01-06 DIAGNOSIS — C50219 Malignant neoplasm of upper-inner quadrant of unspecified female breast: Secondary | ICD-10-CM

## 2012-01-06 NOTE — Progress Notes (Signed)
Pt. FTKA, sent POF to schedulers for reschedule.

## 2012-01-07 ENCOUNTER — Telehealth: Payer: Self-pay | Admitting: Oncology

## 2012-01-07 NOTE — Telephone Encounter (Signed)
called pt lmovm for appt in april2013

## 2012-02-06 ENCOUNTER — Telehealth: Payer: Self-pay | Admitting: *Deleted

## 2012-02-06 ENCOUNTER — Ambulatory Visit (HOSPITAL_BASED_OUTPATIENT_CLINIC_OR_DEPARTMENT_OTHER): Payer: Medicare Other | Admitting: Physician Assistant

## 2012-02-06 ENCOUNTER — Encounter: Payer: Self-pay | Admitting: Physician Assistant

## 2012-02-06 ENCOUNTER — Ambulatory Visit (HOSPITAL_BASED_OUTPATIENT_CLINIC_OR_DEPARTMENT_OTHER): Payer: Medicare Other | Admitting: Lab

## 2012-02-06 VITALS — BP 157/90 | HR 83 | Temp 99.1°F | Ht 61.0 in | Wt 294.9 lb

## 2012-02-06 DIAGNOSIS — Z170 Estrogen receptor positive status [ER+]: Secondary | ICD-10-CM

## 2012-02-06 DIAGNOSIS — C50919 Malignant neoplasm of unspecified site of unspecified female breast: Secondary | ICD-10-CM

## 2012-02-06 DIAGNOSIS — C50219 Malignant neoplasm of upper-inner quadrant of unspecified female breast: Secondary | ICD-10-CM

## 2012-02-06 DIAGNOSIS — Z9010 Acquired absence of unspecified breast and nipple: Secondary | ICD-10-CM

## 2012-02-06 LAB — CBC WITH DIFFERENTIAL/PLATELET
Basophils Absolute: 0 10*3/uL (ref 0.0–0.1)
EOS%: 1.5 % (ref 0.0–7.0)
Eosinophils Absolute: 0.1 10*3/uL (ref 0.0–0.5)
HCT: 40.6 % (ref 34.8–46.6)
HGB: 13.7 g/dL (ref 11.6–15.9)
MCH: 31.6 pg (ref 25.1–34.0)
MCV: 93.8 fL (ref 79.5–101.0)
MONO%: 9.2 % (ref 0.0–14.0)
NEUT#: 2.1 10*3/uL (ref 1.5–6.5)
NEUT%: 44.1 % (ref 38.4–76.8)

## 2012-02-06 LAB — COMPREHENSIVE METABOLIC PANEL
Albumin: 3.4 g/dL — ABNORMAL LOW (ref 3.5–5.2)
Alkaline Phosphatase: 75 U/L (ref 39–117)
BUN: 7 mg/dL (ref 6–23)
CO2: 29 mEq/L (ref 19–32)
Glucose, Bld: 85 mg/dL (ref 70–99)
Potassium: 3.5 mEq/L (ref 3.5–5.3)
Total Bilirubin: 0.8 mg/dL (ref 0.3–1.2)
Total Protein: 6.6 g/dL (ref 6.0–8.3)

## 2012-02-06 LAB — LACTATE DEHYDROGENASE: LDH: 150 U/L (ref 94–250)

## 2012-02-06 NOTE — Telephone Encounter (Signed)
patient to go back to the lab on 02-06-2012 gave patient appointment for 02-2013 with lab one week before md visit

## 2012-02-06 NOTE — Progress Notes (Signed)
Hematology and Oncology Follow Up Visit  MADELENE KAATZ [id number] [birth year] 71 y.o. 02/06/2012    HPI: Patient is a 71 year old [place] woman with a history of a T2, N0, ER/PR positive right breast carcinoma for which she underwent a right modified radical mastectomy in January 2005, followed by Arimidex 1 mg by mouth daily until January 2010. 2. History of hypertension 3. history of morbid obesity  Interim History:   Ms. Secrist is seen today with her granddaughter and accompaniment. She really voices no specific complaints, no fevers, chills, night sweats, no shortness of breath issues or chest pain. Her appetite has been excellent without nausea, emesis, diarrhea, or constipation issues. Her medication list has been reviewed and updated. It should be noted her mammogram is past due.  A detailed review of systems is otherwise noncontributory as noted below.  Review of Systems: Constitutional:  no weight loss, fever, night sweats and feels well Eyes: no complaints ENT: no complaints Cardiovascular: no chest pain or dyspnea on exertion Respiratory: no cough, shortness of breath, or wheezing Neurological: no TIA or stroke symptoms Dermatological: negative Gastrointestinal: no abdominal pain, change in bowel habits, or black or bloody stools Genito-Urinary: no dysuria, trouble voiding, or hematuria Hematological and Lymphatic: negative Breast: negative Musculoskeletal: negative Remaining ROS negative.   Medications:   I have reviewed the patient's current medications.  Current Outpatient Prescriptions  Medication Sig Dispense Refill  . aspirin 81 MG tablet Take 81 mg by mouth daily.      . Multiple Vitamins-Minerals (CENTRUM SILVER PO) Take by mouth daily.        . verapamil (COVERA HS) 180 MG (CO) 24 hr tablet Take 180 mg by mouth at bedtime.        . naproxen (NAPROSYN) 500 MG tablet Take 500 mg by mouth as needed.          Allergies: No Known  Allergies  Physical Exam: Filed Vitals:   02/06/12 1434  BP: 157/90  Pulse: 83  Temp: 99.1 F (37.3 C)    Body mass index is 55.72 kg/(m^2). Weight: 294 lbs. HEENT:  Sclerae anicteric, conjunctivae pink.  Oropharynx clear.  No mucositis or candidiasis.   Nodes:  No cervical, supraclavicular, or axillary lymphadenopathy palpated.  Breast Exam: Her right breast is surgically absent, the mastectomy scar is benign without any evidence of skin nodules or masses. The left breast is large and pendulous. No evidence of obvious mass effect, no nipple inversion or discharge axilla is clear. Lungs:  Clear to auscultation bilaterally.  No crackles, rhonchi, or wheezes.   Heart:  Regular rate and rhythm.   Abdomen:  Soft, obese and nontender.  Positive bowel sounds.  No organomegaly or masses palpated, but difficult to examine due to habitus.  Musculoskeletal:  No focal spinal tenderness to palpation.  Extremities:  Benign.  No peripheral edema or cyanosis.   Skin:  Benign.   Neuro:  Nonfocal.   Lab Results: Lab Results  Component Value Date   WBC 5.8 03/26/2011   HGB 14.1 03/26/2011   HCT 42.7 03/26/2011   MCV 92.8 03/26/2011   PLT 196 03/26/2011   NEUTROABS 2.5 01/09/2011     Chemistry      Component Value Date/Time   NA 140 03/26/2011 1207   K 3.3* 03/26/2011 1207   CL 103 03/26/2011 1207   CO2 27 03/26/2011 1207   BUN 5* 03/26/2011 1207   CREATININE 0.55 03/26/2011 1207      Component Value Date/Time  CALCIUM 9.3 03/26/2011 1207   ALKPHOS 65 01/09/2011 1120   AST 21 01/09/2011 1120   ALT 12 01/09/2011 1120   BILITOT 0.9 01/09/2011 1120      Lab Results  Component Value Date   LABCA2 10 01/09/2011    Assessment:  Patient is a 71 year old [place] woman with a history of a T2, N0, ER/PR positive right breast carcinoma for which she underwent a right modified radical mastectomy in January 2005, followed by Arimidex 1 mg by mouth daily until January 2010. 2. History of  hypertension 3. history of morbid obesity  Case reviewed with Dr. Pierce Crane.  Plan:  Mrs. Stembridge will be referred back to the lab so that we may obtain a CBC, serum chemistry, LDH, and CA 27-29. [facility] of the left side. We will plan on seeing her back in one years time for oncologic followup, though she does know we would be happy to see her prior if the need should arise.   This plan was reviewed with the patient, who voices understanding and agreement.  She knows to call with any changes or problems.    Harrol Novello T, PA-C 02/06/2012

## 2012-02-09 LAB — CANCER ANTIGEN 27.29: CA 27.29: 7 U/mL (ref 0–39)

## 2012-09-08 ENCOUNTER — Other Ambulatory Visit (HOSPITAL_COMMUNITY): Payer: Self-pay | Admitting: Obstetrics

## 2012-09-08 DIAGNOSIS — R109 Unspecified abdominal pain: Secondary | ICD-10-CM

## 2012-09-14 ENCOUNTER — Ambulatory Visit (HOSPITAL_COMMUNITY)
Admission: RE | Admit: 2012-09-14 | Discharge: 2012-09-14 | Disposition: A | Discharge status: Home / Self Care | Payer: Medicare Other | Source: Ambulatory Visit | Attending: Obstetrics | Admitting: Obstetrics

## 2012-09-14 ENCOUNTER — Other Ambulatory Visit (HOSPITAL_COMMUNITY): Payer: Medicare Other

## 2012-09-14 DIAGNOSIS — R109 Unspecified abdominal pain: Secondary | ICD-10-CM | POA: Insufficient documentation

## 2012-09-14 DIAGNOSIS — N2889 Other specified disorders of kidney and ureter: Secondary | ICD-10-CM | POA: Insufficient documentation

## 2012-09-14 DIAGNOSIS — Z853 Personal history of malignant neoplasm of breast: Secondary | ICD-10-CM | POA: Insufficient documentation

## 2012-11-10 ENCOUNTER — Other Ambulatory Visit (HOSPITAL_COMMUNITY): Payer: Self-pay | Admitting: Urology

## 2012-11-10 DIAGNOSIS — N1330 Unspecified hydronephrosis: Secondary | ICD-10-CM

## 2012-11-15 ENCOUNTER — Ambulatory Visit (HOSPITAL_COMMUNITY): Payer: Medicare Other

## 2012-11-26 ENCOUNTER — Ambulatory Visit (HOSPITAL_COMMUNITY)
Admission: RE | Admit: 2012-11-26 | Discharge: 2012-11-26 | Disposition: A | Discharge status: Home / Self Care | Payer: Medicare Other | Source: Ambulatory Visit | Attending: Urology | Admitting: Urology

## 2012-11-26 DIAGNOSIS — N1330 Unspecified hydronephrosis: Secondary | ICD-10-CM | POA: Insufficient documentation

## 2012-11-26 MED ORDER — FUROSEMIDE 10 MG/ML IJ SOLN
60.0000 mg | Freq: Once | INTRAMUSCULAR | Status: AC
  Administered 2012-11-26: 60 mg via INTRAVENOUS
  Filled 2012-11-26: qty 6

## 2012-11-26 MED ORDER — TECHNETIUM TC 99M MERTIATIDE
15.2000 | Freq: Once | INTRAVENOUS | Status: AC | PRN
  Administered 2012-11-26: 15.2 via INTRAVENOUS

## 2012-12-24 ENCOUNTER — Telehealth: Payer: Self-pay | Admitting: *Deleted

## 2012-12-24 NOTE — Telephone Encounter (Signed)
Called and spoke with patient to reschedule her appt.  Confirmed appt. For 02/08/13 at 1pm with Bernell List.  Then will become Dr. Darnelle Catalan.

## 2013-01-10 ENCOUNTER — Encounter: Payer: Self-pay | Admitting: *Deleted

## 2013-01-10 NOTE — Progress Notes (Signed)
Mailed letter & calendar to pt. 

## 2013-02-01 ENCOUNTER — Other Ambulatory Visit: Payer: Medicare Other | Admitting: Lab

## 2013-02-08 ENCOUNTER — Ambulatory Visit: Payer: Medicare Other | Admitting: Oncology

## 2013-02-08 ENCOUNTER — Ambulatory Visit (HOSPITAL_BASED_OUTPATIENT_CLINIC_OR_DEPARTMENT_OTHER): Payer: Medicare Other | Admitting: Family

## 2013-02-08 ENCOUNTER — Encounter: Payer: Self-pay | Admitting: Family

## 2013-02-08 VITALS — BP 152/94 | HR 78 | Temp 98.6°F | Resp 20 | Ht 61.0 in | Wt 282.4 lb

## 2013-02-08 DIAGNOSIS — C50911 Malignant neoplasm of unspecified site of right female breast: Secondary | ICD-10-CM

## 2013-02-08 DIAGNOSIS — Z853 Personal history of malignant neoplasm of breast: Secondary | ICD-10-CM

## 2013-02-08 DIAGNOSIS — C50919 Malignant neoplasm of unspecified site of unspecified female breast: Secondary | ICD-10-CM | POA: Insufficient documentation

## 2013-02-08 NOTE — Patient Instructions (Signed)
Please contact us at [phone] if you have any questions or concerns.  Breast exam every year by a physician  Mammogram every year - we will schedule a mammogram for you this year.

## 2013-02-08 NOTE — Progress Notes (Signed)
[facility]  [phone] [phone]  OFFICE PROGRESS NOTE   ID: Carol Martin   DOB: 06-22-1941  MR#: [id number]  BMW#:[id number]   PCP: Dorrene German, MD SU: Rose Phi.  Young, MD   HISTORY OF PRESENT ILLNESS: From Dr. Theron Arista Rubin's new patient evaluation dated 12/22/2003: "Carol Martin is a 72 year old woman. This woman has been in relatively good health.  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  She has noted a knot in her right breast for many, many months, maybe close to a year.  She has not noticed any skin changes or nipple retraction.  She apparently had a mammogram about a year ago and did not have a follow-up.  She subsequently did have another mammogram on 10/06/03.  This showed a microlobulated mass.  There were some adjacent nodules seen.  Ultrasound showed a microlobulated mass in the 2 o'clock position, 15 cm from the right nipple.  The dominant mass measured 1.4 x 1.3 x 1.8 cm.  The adjacent nodule measured 0.5 x 0.5 x 0.6 cm.  Biopsy was recommended and performed 10/06/03.  Final pathology showed invasive mammary carcinoma, poorly differentiated.  The patient underwent a lumpectomy with sentinel lymph node evaluation on 10/17/03.  Final pathology showed a 3.3 cm and a 1.7 multifocal cancer with extensive DCIS involving at least 11 cm with close margin involvement.  Lymphovascular invasion was present.  Skin was unremarkable.  It was a grade 3/3.  One sentinel lymph node was identified and showed micrometastasis seen.  The tumor was strongly ER and PR positive.  HER-2 was 2+.  FISH was negative.  Ki-67 was 10%.  Axilla and skin was benign.  Because of the margin involvement, mastectomy was recommended, which was performed on 11/28/03.  At time of mastectomy, a residual small focus of high-grade DCIS was seen.  Negative surgical margins were noted.  She had an unremarkable postoperative  course."  Her subsequent history is as detailed below.  INTERVAL HISTORY: Dr. 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  She was last seen by Sharyl Nimrod, PA-C on 02/06/2012.  Since her last office visit, the patient states that she has been doing relatively well.  The patient is establishing herself with Dr. Darrall Dears service today.  REVIEW OF SYSTEMS: A 10 point review of systems was completed and is negative except for occasional constipation and complaints of GERD.  The patient's blood pressure is elevated today at 152/94.  The patient states that she did not take her blood pressure medication today.  The patient denies any other symptomatology.   PAST MEDICAL HISTORY: Past Medical History  Diagnosis Date  . Abnormal EKG   . Venous (peripheral) insufficiency   . Hypertension   . Kidney stones   . Arthritis of knee   . Sleep apnea     with insomnia  . Morbid obesity   . Breast cancer   . Alzheimer's disease 05/17/07  . GERD (gastroesophageal reflux disease)   . Allergic rhinitis     PAST SURGICAL HISTORY: Past Surgical History  Procedure Laterality Date  . Ectopic pregnancy surgery    . Cesarean section    . Kidney stone surgery      REMOVAL  . Mastectomy    . Dilation and curettage of uterus    . Tracheostomy      FAMILY  HISTORY Family History  Problem Relation Age of Onset  . Hypertension Mother   . Hypertension Sister   . Cancer Sister     Unknown cancer  She has two brothers and three sisters who are living.  One sister apparently died of metastatic cancer.  There is no history of breast cancer in the family.  She lives by herself but has an aide at home.  She has two daughters who also help her out. She is widowed.    GYNECOLOGIC HISTORY: She is gravida 38, para 8.  History of multiple pregnancies.  Menarche at age 63, menopause in her 61s.  No history of hormone replacement  therapy.    SOCIAL HISTORY: The patient is a retired Engineer, [employer].  She states that she has 8 adult children 3 girls and 5 boys.  She is a widow.  She likes to spend time with her family and attend church in her free time.   ADVANCED DIRECTIVES: Not on file  HEALTH MAINTENANCE: History  Substance Use Topics  . Smoking status: Never Smoker   . Smokeless tobacco: Never Used  . Alcohol Use: No    Colonoscopy:Not on file  PAP:Not on file  Bone density:The last bone density we have on file is from 01/25/2007 in which her T score at that time was 0.2 (normal). Lipid panel:05/22/2011  No Known Allergies  Current Outpatient Prescriptions  Medication Sig Dispense Refill  . aspirin 81 MG tablet Take 81 mg by mouth daily.      Marland Kitchen lisinopril-hydrochlorothiazide (PRINZIDE,ZESTORETIC) 20-12.5 MG per tablet Take 1 tablet by mouth daily.       . Multiple Vitamins-Minerals (CENTRUM SILVER PO) Take by mouth daily.        . naproxen (NAPROSYN) 500 MG tablet Take 500 mg by mouth as needed.        . RESTASIS 0.05 % ophthalmic emulsion Place 1 drop into both eyes every 12 (twelve) hours.       . verapamil (COVERA HS) 180 MG (CO) 24 hr tablet Take 180 mg by mouth at bedtime.         No current facility-administered medications for this visit.    OBJECTIVE: Filed Vitals:   02/08/13 1323  BP: 152/94  Pulse: 78  Temp: 98.6 F (37 C)  Resp: 20     Body mass index is 53.39 kg/(m^2).      ECOG FS:  Grade 1 - Symptomatic, but fully ambulatory                    General appearance: Alert, cooperative, well nourished,morbidly obese, no apparent distress Head: Normocephalic, without obvious abnormality, atraumatic Eyes:  Arcus senilis, PERRLA, EOMI Nose: Nares, septum and mucosa are normal, no drainage or sinus tenderness Neck:  Excessive habitus, supple, symmetrical, trachea midline, thyroid not enlarged, no tenderness Resp: Clear to auscultation bilaterally, Diminished bibasilar breath sounds   Cardio: Regular rate and rhythm, S1, S2 normal, no murmur, click, rub or gallop Breasts:  Right breast is surgically absent, well-healed surgical scars, pendulous left breast, no lymphadenopathy, no nipple inversion GI:  Excessive habitus,firm, distended, non-tender, hypoactive bowel sounds,  unable to adequately assess organomegaly  Extremities: Extremities have excessive habitus, atraumatic, no cyanosis  Lymph nodes: Cervical and supraclavicular nodes are normal Neurologic: Grossly normal, ambulates with a walker  LAB RESULTS: Lab Results  Component Value Date   WBC 4.7 02/06/2012   NEUTROABS 2.1 02/06/2012   HGB 13.7 02/06/2012   HCT 40.6 02/06/2012  MCV 93.8 02/06/2012   PLT 194 02/06/2012      Chemistry      Component Value Date/Time   NA 140 02/06/2012 1517   K 3.5 02/06/2012 1517   CL 105 02/06/2012 1517   CO2 29 02/06/2012 1517   BUN 7 02/06/2012 1517   CREATININE 0.64 02/06/2012 1517      Component Value Date/Time   CALCIUM 9.1 02/06/2012 1517   ALKPHOS 75 02/06/2012 1517   AST 19 02/06/2012 1517   ALT 9 02/06/2012 1517   BILITOT 0.8 02/06/2012 1517      Lab Results  Component Value Date   LABCA2 7 02/06/2012    Urinalysis    Component Value Date/Time   COLORURINE YELLOW 03/26/2011 1209   APPEARANCEUR CLEAR 03/26/2011 1209   LABSPEC 1.015 03/26/2011 1209   PHURINE 7.0 03/26/2011 1209   GLUCOSEU NEGATIVE 03/26/2011 1209   HGBUR NEGATIVE 03/26/2011 1209   BILIRUBINUR NEGATIVE 03/26/2011 1209   KETONESUR NEGATIVE 03/26/2011 1209   PROTEINUR NEGATIVE 03/26/2011 1209   UROBILINOGEN 0.2 03/26/2011 1209   NITRITE NEGATIVE 03/26/2011 1209   LEUKOCYTESUR SMALL* 03/26/2011 1209    STUDIES: No results found.  ASSESSMENT: 72 y.[place]: 1.  Status post right breast needle core biopsy of the upper-inner quadrant on 10/06/2003 which showed invasive mammary carcinoma 1.4 cm.   2.  Status post right breast lumpectomy on 10/17/2003 for a stage IIA, pT2, pN0 (i+) (sn), multifocal  invasive ductal carcinoma 3 cm and 1.7 mm grade 3, with extensive DCIS and necrosis high grade 11 cm, DCIS involved deep superior and lateral margins, lymphovascular invasion identified, ER 96%, PR 96%, KI 67 10%, HER-2/neu 2+ (borderline ) FISH was negative, 1/1 positive lymph node for ITC.  3.  Because of margin involvement, mastectomy was recommended.  Status post right simple mastectomy for residual small focus of high grade intraductal carcinoma on 11/28/2003 which showed negative surgical margins of resection.  A separate lymph node with micrometastatic carcinoma was identified.  4.  Status post antiestrogen therapy with Arimidex from 04/05 through 01//2011.  PLAN: The patient is nearing 10 years from her time of diagnosis and will officially become a graduate of [employer]'s breast cancer program today.  The patient's daughter was present for her graduation ceremony.  We asked that she continue annual clinical breast examinations by a physician in addition to annual mammography. She is past due for a unilateral left breast screening mammogram.  We will schedule a mammogram for her.  Her last digital screening unilateral left mammogram on 12/26/2010 showed no mammographic evidence of malignancy.  All questions were answered.  The patient was encouraged to contact us in the interim with any problems, questions or concerns.   Larina Bras, NP-C 02/12/2013, 12:48 PM

## 2013-02-09 ENCOUNTER — Telehealth: Payer: Self-pay | Admitting: Oncology

## 2013-02-09 NOTE — Telephone Encounter (Signed)
S/W THE PT'S DTR AND SHE IS AWARE OF THE MAMMO APPT AT THE [facility] IN April.

## 2013-02-23 ENCOUNTER — Ambulatory Visit (HOSPITAL_COMMUNITY)
Admission: RE | Admit: 2013-02-23 | Discharge: 2013-02-23 | Disposition: A | Discharge status: Home / Self Care | Payer: Medicare Other | Source: Ambulatory Visit | Attending: Family | Admitting: Family

## 2013-02-23 DIAGNOSIS — Z853 Personal history of malignant neoplasm of breast: Secondary | ICD-10-CM

## 2013-02-23 DIAGNOSIS — C50911 Malignant neoplasm of unspecified site of right female breast: Secondary | ICD-10-CM

## 2013-02-23 DIAGNOSIS — Z1231 Encounter for screening mammogram for malignant neoplasm of breast: Secondary | ICD-10-CM | POA: Insufficient documentation

## 2013-05-24 ENCOUNTER — Other Ambulatory Visit (HOSPITAL_COMMUNITY): Payer: Self-pay | Admitting: Urology

## 2013-05-24 DIAGNOSIS — N1330 Unspecified hydronephrosis: Secondary | ICD-10-CM

## 2013-06-06 ENCOUNTER — Encounter (HOSPITAL_COMMUNITY): Admission: RE | Admit: 2013-06-06 | Payer: Medicare Other | Source: Ambulatory Visit

## 2013-08-31 DIAGNOSIS — G4730 Sleep apnea, unspecified: Secondary | ICD-10-CM | POA: Insufficient documentation

## 2013-09-01 ENCOUNTER — Ambulatory Visit (INDEPENDENT_AMBULATORY_CARE_PROVIDER_SITE_OTHER): Payer: Medicare Other | Admitting: Cardiology

## 2013-09-01 ENCOUNTER — Encounter: Payer: Self-pay | Admitting: Cardiology

## 2013-09-01 VITALS — BP 112/78 | HR 83 | Ht 61.0 in | Wt 281.8 lb

## 2013-09-01 DIAGNOSIS — I77810 Thoracic aortic ectasia: Secondary | ICD-10-CM

## 2013-09-01 DIAGNOSIS — I10 Essential (primary) hypertension: Secondary | ICD-10-CM

## 2013-09-01 DIAGNOSIS — I519 Heart disease, unspecified: Secondary | ICD-10-CM

## 2013-09-01 DIAGNOSIS — I5189 Other ill-defined heart diseases: Secondary | ICD-10-CM

## 2013-09-01 DIAGNOSIS — I351 Nonrheumatic aortic (valve) insufficiency: Secondary | ICD-10-CM | POA: Insufficient documentation

## 2013-09-01 DIAGNOSIS — G4730 Sleep apnea, unspecified: Secondary | ICD-10-CM

## 2013-09-01 DIAGNOSIS — I5032 Chronic diastolic (congestive) heart failure: Secondary | ICD-10-CM | POA: Insufficient documentation

## 2013-09-01 NOTE — Progress Notes (Signed)
[address] [place], [place]  [address] Phone: [phone] Fax:  [phone]  Date:  09/01/2013   ID:  Carol Martin, DOB 19-Sep-1941, MRN [id number]  PCP:  Dorrene German, MD  Sleep Medicine:  Armanda Magic, MD     History of Present Illness: Carol Martin is a 72 y.o. female with a history of OSA on CPAP, morbid obesity and HTN.  She is doing well.  She saw me last spring for abnormal EKG and DOE and echo showed diastolic dysfunction and nuclear stress test showed no ischemia and normal LVF.  She tolerates her CPAP well but is not very compliant.  She has some daytime sleepiness still and feels tired but does not always use it at night.  She tolerates her mask and pressure well.    Wt Readings from Last 3 Encounters:  09/01/13 281 lb 12.8 oz (127.824 kg)  02/08/13 282 lb 6.4 oz (128.096 kg)  02/06/12 294 lb 14.4 oz (133.766 kg)     Past Medical History  Diagnosis Date  . Abnormal EKG   . Venous (peripheral) insufficiency   . Hypertension   . Kidney stones   . Arthritis of knee   . Sleep apnea     with insomnia  . Morbid obesity   . Breast cancer   . Alzheimer's disease 05/17/07  . GERD (gastroesophageal reflux disease)   . Allergic rhinitis   . Dilated aortic root   . Diastolic dysfunction   . Mild aortic insufficiency     Current Outpatient Prescriptions  Medication Sig Dispense Refill  . aspirin 81 MG tablet Take 81 mg by mouth daily.      Marland Kitchen lisinopril-hydrochlorothiazide (PRINZIDE,ZESTORETIC) 20-12.5 MG per tablet Take 1 tablet by mouth daily.       . Multiple Vitamins-Minerals (CENTRUM SILVER PO) Take by mouth daily.        . naproxen (NAPROSYN) 500 MG tablet Take 500 mg by mouth as needed.        . potassium chloride (K-DUR,KLOR-CON) 10 MEQ tablet       . RESTASIS 0.05 % ophthalmic emulsion Place 1 drop into both eyes every 12 (twelve) hours.       . torsemide (DEMADEX) 20 MG tablet       . verapamil (COVERA HS) 180 MG (CO) 24 hr tablet Take  180 mg by mouth at bedtime.         No current facility-administered medications for this visit.    Allergies:   No Known Allergies  Social History:  The patient  reports that she has never smoked. She has never used smokeless tobacco. She reports that she does not drink alcohol or use illicit drugs.   Family History:  The patient's family history includes Cancer in her sister; Hypertension in her mother and sister.   ROS:  Please see the history of present illness.      All other systems reviewed and negative.   PHYSICAL EXAM: VS:  Ht 5\' 1"  (1.549 m)  Wt 281 lb 12.8 oz (127.824 kg)  BMI 53.27 kg/m2 Well nourished, well developed, in no acute distress HEENT: normal Neck: no JVD Cardiac:  normal S1, S2; RRR; no murmur Lungs:  clear to auscultation bilaterally, no wheezing, rhonchi or rales Abd: soft, nontender, no hepatomegaly Ext: no edema Skin: warm and dry Neuro:  CNs 2-12 intact, no focal abnormalities noted       ASSESSMENT AND PLAN:  1. Obstructive OSA on CPAP  and tolerating well  - I will get a CPAP d/l 2.  HTN well controlled  - continue Lisinopril HCT/Verapamil 3.  Morbid obesity - she is limited in her ability to exercise due to inability to walk well due to leg problems 4.  Mildly dilated aortic root  - repeat echo in May 2015 to evaluate for progression  Followup with me in 6 months  Signed, Armanda Magic, MD 09/01/2013 4:25 PM

## 2013-09-01 NOTE — Patient Instructions (Signed)
Your physician recommends that you continue on your current medications as directed. Please refer to the Current Medication list given to you today.  Your physician has requested that you have an echocardiogram. Echocardiography is a painless test that uses sound waves to create images of your heart. It provides your doctor with information about the size and shape of your heart and how well your heart's chambers and valves are working. This procedure takes approximately one hour. There are no restrictions for this procedure.  Your physician wants you to follow-up in: 6 months with Dr Turner You will receive a reminder letter in the mail two months in advance. If you don't receive a letter, please call our office to schedule the follow-up appointment.  

## 2013-09-21 ENCOUNTER — Other Ambulatory Visit (HOSPITAL_COMMUNITY): Payer: Medicare Other

## 2014-02-01 ENCOUNTER — Encounter: Payer: Self-pay | Admitting: Cardiology

## 2014-02-14 ENCOUNTER — Other Ambulatory Visit (HOSPITAL_COMMUNITY): Payer: Self-pay | Admitting: Internal Medicine

## 2014-02-14 DIAGNOSIS — Z1231 Encounter for screening mammogram for malignant neoplasm of breast: Secondary | ICD-10-CM

## 2014-02-15 ENCOUNTER — Encounter: Payer: Self-pay | Admitting: Cardiology

## 2014-02-20 ENCOUNTER — Encounter: Payer: Self-pay | Admitting: Cardiology

## 2014-03-01 ENCOUNTER — Ambulatory Visit (HOSPITAL_COMMUNITY): Payer: Medicare Other

## 2014-03-08 ENCOUNTER — Ambulatory Visit (HOSPITAL_COMMUNITY)
Admission: RE | Admit: 2014-03-08 | Discharge: 2014-03-08 | Disposition: A | Discharge status: Home / Self Care | Payer: Medicare Other | Source: Ambulatory Visit | Attending: Internal Medicine | Admitting: Internal Medicine

## 2014-03-08 ENCOUNTER — Other Ambulatory Visit (HOSPITAL_COMMUNITY): Payer: Self-pay | Admitting: Internal Medicine

## 2014-03-08 DIAGNOSIS — Z1231 Encounter for screening mammogram for malignant neoplasm of breast: Secondary | ICD-10-CM

## 2014-03-14 ENCOUNTER — Ambulatory Visit (HOSPITAL_COMMUNITY): Payer: Medicare Other

## 2014-03-17 ENCOUNTER — Other Ambulatory Visit (HOSPITAL_COMMUNITY): Payer: Medicare Other

## 2014-04-03 ENCOUNTER — Ambulatory Visit (HOSPITAL_COMMUNITY): Payer: Medicare Other | Attending: Cardiology | Admitting: Cardiology

## 2014-04-03 DIAGNOSIS — R9431 Abnormal electrocardiogram [ECG] [EKG]: Secondary | ICD-10-CM | POA: Insufficient documentation

## 2014-04-03 DIAGNOSIS — I77810 Thoracic aortic ectasia: Secondary | ICD-10-CM

## 2014-04-03 DIAGNOSIS — I359 Nonrheumatic aortic valve disorder, unspecified: Secondary | ICD-10-CM | POA: Insufficient documentation

## 2014-04-03 DIAGNOSIS — I079 Rheumatic tricuspid valve disease, unspecified: Secondary | ICD-10-CM | POA: Insufficient documentation

## 2014-04-03 DIAGNOSIS — I10 Essential (primary) hypertension: Secondary | ICD-10-CM | POA: Insufficient documentation

## 2014-04-03 DIAGNOSIS — I379 Nonrheumatic pulmonary valve disorder, unspecified: Secondary | ICD-10-CM | POA: Insufficient documentation

## 2014-04-03 NOTE — Progress Notes (Signed)
Limited echo performed. 

## 2014-04-04 ENCOUNTER — Other Ambulatory Visit: Payer: Self-pay | Admitting: *Deleted

## 2014-04-04 ENCOUNTER — Encounter: Payer: Self-pay | Admitting: *Deleted

## 2014-04-04 DIAGNOSIS — I340 Nonrheumatic mitral (valve) insufficiency: Secondary | ICD-10-CM

## 2014-05-23 ENCOUNTER — Emergency Department (HOSPITAL_COMMUNITY): Payer: Medicare Other

## 2014-05-23 ENCOUNTER — Emergency Department (HOSPITAL_COMMUNITY)
Admission: EM | Admit: 2014-05-23 | Discharge: 2014-05-24 | Disposition: A | Discharge status: Home / Self Care | Payer: Medicare Other | Attending: Emergency Medicine | Admitting: Emergency Medicine

## 2014-05-23 ENCOUNTER — Encounter (HOSPITAL_COMMUNITY): Payer: Self-pay | Admitting: Emergency Medicine

## 2014-05-23 DIAGNOSIS — F0280 Dementia in other diseases classified elsewhere without behavioral disturbance: Secondary | ICD-10-CM | POA: Insufficient documentation

## 2014-05-23 DIAGNOSIS — Z87442 Personal history of urinary calculi: Secondary | ICD-10-CM | POA: Insufficient documentation

## 2014-05-23 DIAGNOSIS — Z853 Personal history of malignant neoplasm of breast: Secondary | ICD-10-CM | POA: Insufficient documentation

## 2014-05-23 DIAGNOSIS — Z8709 Personal history of other diseases of the respiratory system: Secondary | ICD-10-CM | POA: Diagnosis not present

## 2014-05-23 DIAGNOSIS — I10 Essential (primary) hypertension: Secondary | ICD-10-CM | POA: Diagnosis not present

## 2014-05-23 DIAGNOSIS — G309 Alzheimer's disease, unspecified: Secondary | ICD-10-CM | POA: Insufficient documentation

## 2014-05-23 DIAGNOSIS — IMO0002 Reserved for concepts with insufficient information to code with codable children: Secondary | ICD-10-CM

## 2014-05-23 DIAGNOSIS — R42 Dizziness and giddiness: Secondary | ICD-10-CM

## 2014-05-23 DIAGNOSIS — Z7982 Long term (current) use of aspirin: Secondary | ICD-10-CM | POA: Insufficient documentation

## 2014-05-23 DIAGNOSIS — M1710 Unilateral primary osteoarthritis, unspecified knee: Secondary | ICD-10-CM | POA: Insufficient documentation

## 2014-05-23 DIAGNOSIS — Z79899 Other long term (current) drug therapy: Secondary | ICD-10-CM | POA: Diagnosis not present

## 2014-05-23 DIAGNOSIS — R51 Headache: Secondary | ICD-10-CM | POA: Insufficient documentation

## 2014-05-23 DIAGNOSIS — Z8719 Personal history of other diseases of the digestive system: Secondary | ICD-10-CM | POA: Diagnosis not present

## 2014-05-23 LAB — CBC WITH DIFFERENTIAL/PLATELET
BASOS ABS: 0 10*3/uL (ref 0.0–0.1)
BASOS PCT: 1 % (ref 0–1)
EOS ABS: 0.1 10*3/uL (ref 0.0–0.7)
EOS PCT: 2 % (ref 0–5)
HCT: 41 % (ref 36.0–46.0)
HEMOGLOBIN: 13.7 g/dL (ref 12.0–15.0)
Lymphocytes Relative: 42 % (ref 12–46)
Lymphs Abs: 2.4 10*3/uL (ref 0.7–4.0)
MCH: 30.8 pg (ref 26.0–34.0)
MCHC: 33.4 g/dL (ref 30.0–36.0)
MCV: 92.1 fL (ref 78.0–100.0)
MONO ABS: 0.6 10*3/uL (ref 0.1–1.0)
MONOS PCT: 10 % (ref 3–12)
NEUTROS ABS: 2.6 10*3/uL (ref 1.7–7.7)
Neutrophils Relative %: 45 % (ref 43–77)
Platelets: 188 10*3/uL (ref 150–400)
RBC: 4.45 MIL/uL (ref 3.87–5.11)
RDW: 12.9 % (ref 11.5–15.5)
WBC: 5.6 10*3/uL (ref 4.0–10.5)

## 2014-05-23 LAB — BASIC METABOLIC PANEL
ANION GAP: 11 (ref 5–15)
BUN: 11 mg/dL (ref 6–23)
CALCIUM: 9.1 mg/dL (ref 8.4–10.5)
CHLORIDE: 104 meq/L (ref 96–112)
CO2: 27 mEq/L (ref 19–32)
CREATININE: 0.61 mg/dL (ref 0.50–1.10)
GFR, EST NON AFRICAN AMERICAN: 88 mL/min — AB (ref >90)
Glucose, Bld: 90 mg/dL (ref 70–99)
Potassium: 3.5 mEq/L — ABNORMAL LOW (ref 3.7–5.3)
Sodium: 142 mEq/L (ref 137–147)

## 2014-05-23 LAB — TROPONIN I: Troponin I: <0.3 ng/mL (ref <0.30)

## 2014-05-23 LAB — URINALYSIS, ROUTINE W REFLEX MICROSCOPIC
BILIRUBIN URINE: NEGATIVE
GLUCOSE, UA: NEGATIVE mg/dL
HGB URINE DIPSTICK: NEGATIVE
Ketones, ur: NEGATIVE mg/dL
Nitrite: NEGATIVE
PROTEIN: NEGATIVE mg/dL
Specific Gravity, Urine: 1.006 (ref 1.005–1.030)
Urobilinogen, UA: 0.2 mg/dL (ref 0.0–1.0)
pH: 7 (ref 5.0–8.0)

## 2014-05-23 LAB — URINE MICROSCOPIC-ADD ON

## 2014-05-23 NOTE — ED Notes (Signed)
After speaking with daughter regarding pt complaints, ct head ordered.

## 2014-05-23 NOTE — ED Notes (Signed)
Very vague with complaints, and poor historian

## 2014-05-23 NOTE — ED Notes (Signed)
Pt sts she feels cloudy, dizzy and daisy in her head and just doesn't feel quite right, onset last night and occasionally has pain in her head. sts complains comes and goes over time.

## 2014-05-23 NOTE — ED Notes (Signed)
CSW c/s made in error.  No CSW needs identified by neither pt/pt's family nor MD.

## 2014-05-24 DIAGNOSIS — R42 Dizziness and giddiness: Secondary | ICD-10-CM

## 2014-05-24 LAB — FOLATE: Folate: >20 ng/mL

## 2014-05-24 LAB — SEDIMENTATION RATE: Sed Rate: 15 mm/hr (ref 0–22)

## 2014-05-24 LAB — TSH: TSH: 0.313 u[IU]/mL — ABNORMAL LOW (ref 0.350–4.500)

## 2014-05-24 LAB — VITAMIN B12: Vitamin B-12: 502 pg/mL (ref 211–911)

## 2014-05-24 LAB — RPR

## 2014-05-24 NOTE — Discharge Instructions (Signed)
We saw you in the ER for the headache and dizziness. All the results in the ER are normal. Neurology was consulted, and they don't think dizziness is due to strokes. We are not sure what is causing your symptoms. OUTPATIENT NEUROLOGY FOLLOW UP IS REQUESTED. The workup in the ER is not complete, and is limited to screening for life threatening and emergent conditions only, so please see a primary care doctor for further evaluation.   Dizziness Dizziness is a common problem. It is a feeling of unsteadiness or light-headedness. You may feel like you are about to faint. Dizziness can lead to injury if you stumble or fall. A person of any age group can suffer from dizziness, but dizziness is more common in older adults. CAUSES  Dizziness can be caused by many different things, including:  Middle ear problems.  Standing for too long.  Infections.  An allergic reaction.  Aging.  An emotional response to something, such as the sight of blood.  Side effects of medicines.  Tiredness.  Problems with circulation or blood pressure.  Excessive use of alcohol or medicines, or illegal drug use.  Breathing too fast (hyperventilation).  An irregular heart rhythm (arrhythmia).  A low red blood cell count (anemia).  Pregnancy.  Vomiting, diarrhea, fever, or other illnesses that cause body fluid loss (dehydration).  Diseases or conditions such as Parkinson's disease, high blood pressure (hypertension), diabetes, and thyroid problems.  Exposure to extreme heat. DIAGNOSIS  Your health care provider will ask about your symptoms, perform a physical exam, and perform an electrocardiogram (ECG) to record the electrical activity of your heart. Your health care provider may also perform other heart or blood tests to determine the cause of your dizziness. These may include:  Transthoracic echocardiogram (TTE). During echocardiography, sound waves are used to evaluate how blood flows through your  heart.  Transesophageal echocardiogram (TEE).  Cardiac monitoring. This allows your health care provider to monitor your heart rate and rhythm in real time.  Holter monitor. This is a portable device that records your heartbeat and can help diagnose heart arrhythmias. It allows your health care provider to track your heart activity for several days if needed.  Stress tests by exercise or by giving medicine that makes the heart beat faster. TREATMENT  Treatment of dizziness depends on the cause of your symptoms and can vary greatly. HOME CARE INSTRUCTIONS   Drink enough fluids to keep your urine clear or pale yellow. This is especially important in very hot weather. In older adults, it is also important in cold weather.  Take your medicine exactly as directed if your dizziness is caused by medicines. When taking blood pressure medicines, it is especially important to get up slowly.  Rise slowly from chairs and steady yourself until you feel okay.  In the morning, first sit up on the side of the bed. When you feel okay, stand slowly while holding onto something until you know your balance is fine.  Move your legs often if you need to stand in one place for a long time. Tighten and relax your muscles in your legs while standing.  Have someone stay with you for 1-2 days if dizziness continues to be a problem. Do this until you feel you are well enough to stay alone. Have the person call your health care provider if he or she notices changes in you that are concerning.  Do not drive or use heavy machinery if you feel dizzy.  Do not drink alcohol.  SEEK IMMEDIATE MEDICAL CARE IF:   Your dizziness or light-headedness gets worse.  You feel nauseous or vomit.  You have problems talking, walking, or using your arms, hands, or legs.  You feel weak.  You are not thinking clearly or you have trouble forming sentences. It may take a friend or family member to notice this.  You have chest  pain, abdominal pain, shortness of breath, or sweating.  Your vision changes.  You notice any bleeding.  You have side effects from medicine that seems to be getting worse rather than better. MAKE SURE YOU:   Understand these instructions.  Will watch your condition.  Will get help right away if you are not doing well or get worse. Document Released: 04/15/2001 Document Revised: 10/25/2013 Document Reviewed: 05/09/2011 [facility] Patient Information 2015 [place], [place]. This information is not intended to replace advice given to you by your health care provider. Make sure you discuss any questions you have with your health care provider.  General Headache Without Cause A general headache is pain or discomfort felt around the head or neck area. The cause may not be found.  HOME CARE   Keep all doctor visits.  Only take medicines as told by your doctor.  Lie down in a dark, quiet room when you have a headache.  Keep a journal to find out if certain things bring on headaches. For example, write down:  What you eat and drink.  How much sleep you get.  Any change to your diet or medicines.  Relax by getting a massage or doing other relaxing activities.  Put ice or heat packs on the head and neck area as told by your doctor.  Lessen stress.  Sit up straight. Do not tighten (tense) your muscles.  Quit smoking if you smoke.  Lessen how much alcohol you drink.  Lessen how much caffeine you drink, or stop drinking caffeine.  Eat and sleep on a regular schedule.  Get 7 to 9 hours of sleep, or as told by your doctor.  Keep lights dim if bright lights bother you or make your headaches worse. GET HELP RIGHT AWAY IF:   Your headache becomes really bad.  You have a fever.  You have a stiff neck.  You have trouble seeing.  Your muscles are weak, or you lose muscle control.  You lose your balance or have trouble walking.  You feel like you will pass out (faint), or  you pass out.  You have really bad symptoms that are different than your first symptoms.  You have problems with the medicines given to you by your doctor.  Your medicines do not work.  Your headache feels different than the other headaches.  You feel sick to your stomach (nauseous) or throw up (vomit). MAKE SURE YOU:   Understand these instructions.  Will watch your condition.  Will get help right away if you are not doing well or get worse. Document Released: 07/29/2008 Document Revised: 01/12/2012 Document Reviewed: 10/10/2011 [facility] Patient Information 2015 [place], [place]. This information is not intended to replace advice given to you by your health care provider. Make sure you discuss any questions you have with your health care provider.

## 2014-05-24 NOTE — ED Provider Notes (Signed)
CSN: [id number]     Arrival date & time 05/23/14  1512 History   First MD Initiated Contact with Patient 05/23/14 2022     Chief Complaint  Patient presents with  . Dizziness  . Headache     (Consider location/radiation/quality/duration/timing/severity/associated sxs/prior Treatment) HPI Comments: Pt comes in with cc of dizziness and feeling cloudy. She has hx of aortic regurg, renal stones, HTN, obesity. No hx of strokes. Pt reports that she has been feeling dizzy and "daisy and cloudy" in her head for 2 days. Dizziness is constant - worse with standing up. She denies any n/v/diophoresis. Dizziness is descrbied as lightheadedness. No hearing complains. No new meds. No emesis, diarrhea.  Patient is a 73 y.o. female presenting with dizziness and headaches. The history is provided by the patient.  Dizziness Associated symptoms: headaches   Associated symptoms: no blood in stool, no chest pain, no diarrhea, no nausea, no shortness of breath and no vomiting   Headache Associated symptoms: dizziness   Associated symptoms: no abdominal pain, no cough, no diarrhea, no nausea, no neck pain, no numbness, no seizures and no vomiting     Past Medical History  Diagnosis Date  . Abnormal EKG   . Venous (peripheral) insufficiency   . Hypertension   . Kidney stones   . Arthritis of knee   . Sleep apnea     with insomnia  . Morbid obesity   . Breast cancer   . Alzheimer's disease 05/17/07  . GERD (gastroesophageal reflux disease)   . Allergic rhinitis   . Dilated aortic root   . Diastolic dysfunction   . Mild aortic insufficiency    Past Surgical History  Procedure Laterality Date  . Ectopic pregnancy surgery    . Cesarean section    . Kidney stone surgery      REMOVAL  . Mastectomy    . Dilation and curettage of uterus    . Tracheostomy     Family History  Problem Relation Age of Onset  . Hypertension Mother   . Hypertension Sister   . Cancer Sister     Unknown cancer    History  Substance Use Topics  . Smoking status: Never Smoker   . Smokeless tobacco: Never Used  . Alcohol Use: No   OB History   Grav Para Term Preterm Abortions TAB SAB Ect Mult Living                 Review of Systems  Constitutional: Negative for activity change.  HENT: Negative for facial swelling.   Respiratory: Negative for cough, shortness of breath and wheezing.   Cardiovascular: Negative for chest pain.  Gastrointestinal: Negative for nausea, vomiting, abdominal pain, diarrhea, constipation, blood in stool and abdominal distention.  Genitourinary: Negative for hematuria and difficulty urinating.  Musculoskeletal: Negative for neck pain.  Skin: Negative for color change.  Neurological: Positive for dizziness, light-headedness and headaches. Negative for seizures, facial asymmetry, speech difficulty, weakness and numbness.  Hematological: Does not bruise/bleed easily.  Psychiatric/Behavioral: Negative for confusion.      Allergies  Review of patient's allergies indicates no known allergies.  Home Medications   Prior to Admission medications   Medication Sig Start Date End Date Taking? Authorizing Provider  aspirin 81 MG tablet Take 81 mg by mouth daily.   Yes Historical Provider, MD  lisinopril-hydrochlorothiazide (PRINZIDE,ZESTORETIC) 20-12.5 MG per tablet Take 1 tablet by mouth daily.  01/30/13  Yes Historical Provider, MD  Multiple Vitamin (MULTIVITAMIN WITH MINERALS) TABS  tablet Take 1 tablet by mouth daily.   Yes Historical Provider, MD  verapamil (COVERA HS) 180 MG (CO) 24 hr tablet Take 180 mg by mouth at bedtime.     Yes Historical Provider, MD   BP 132/78  Pulse 60  Temp(Src) 98.8 F (37.1 C) (Oral)  Resp 16  Ht 5\' 4"  (1.626 m)  Wt 280 lb (127.007 kg)  BMI 48.04 kg/m2  SpO2 96% Physical Exam  Nursing note and vitals reviewed. Constitutional: She is oriented to person, place, and time. She appears well-developed and well-nourished.  HENT:  Head:  Normocephalic and atraumatic.  Eyes: EOM are normal. Pupils are equal, round, and reactive to light.  Neck: Neck supple.  Cardiovascular: Normal rate, regular rhythm and normal heart sounds.   Pulmonary/Chest: Effort normal. No respiratory distress.  Abdominal: Soft. She exhibits no distension. There is no tenderness. There is no rebound and no guarding.  Neurological: She is alert and oriented to person, place, and time. No cranial nerve deficit. Coordination normal.  Skin: Skin is warm and dry.    ED Course  Procedures (including critical care time) Labs Review Labs Reviewed  URINALYSIS, ROUTINE W REFLEX MICROSCOPIC - Abnormal; Notable for the following:    Leukocytes, UA TRACE (*)    All other components within normal limits  BASIC METABOLIC PANEL - Abnormal; Notable for the following:    Potassium 3.5 (*)    GFR calc non Af Amer 88 (*)    All other components within normal limits  URINE MICROSCOPIC-ADD ON  CBC WITH DIFFERENTIAL  TROPONIN I  RPR  FOLATE  VITAMIN B12  TSH  SEDIMENTATION RATE    Imaging Review Ct Head Wo Contrast  05/23/2014   CLINICAL DATA:  Dizziness, headache  EXAM: CT HEAD WITHOUT CONTRAST  TECHNIQUE: Contiguous axial images were obtained from the base of the skull through the vertex without intravenous contrast.  COMPARISON:  None.  FINDINGS: No skull fracture is noted. Paranasal sinuses and mastoid air cells are unremarkable. Atherosclerotic calcifications of carotid siphon. No intracranial hemorrhage, mass effect or midline shift. Mild cerebral atrophy. Mild periventricular white matter decreased attenuation probable due to chronic small vessel ischemic changes. No acute cortical infarction. There is prominent size sella region with CSF density. Although this may be due to empty sella I cannot exclude a arachnoid cyst at this level. Further correlation with MRI is recommended.  IMPRESSION: No intracranial hemorrhage, mass effect or midline shift. Mild  cerebral atrophy. Mild periventricular white matter decreased attenuation probable due to chronic small vessel ischemic changes. No acute cortical infarction. There is prominent size sella region with CSF density. Although this may be due to empty sella I cannot exclude a arachnoid cyst at this level. Further correlation with MRI is recommended.   Electronically Signed   By: Lahoma Crocker M.D.   On: 05/23/2014 19:03     EKG Interpretation   Date/Time:  Tuesday May 23 2014 22:54:12 EDT Ventricular Rate:  60 PR Interval:  212 QRS Duration: 103 QT Interval:  422 QTC Calculation: 422 R Axis:   11 Text Interpretation:  Sinus rhythm Borderline prolonged PR interval  Abnormal R-wave progression, early transition Borderline T wave  abnormalities Confirmed by Kathrynn Humble, MD, Thelma Comp [phone]) on 05/23/2014  11:27:01 PM      MDM   Final diagnoses:  None    Pt comes in with cc of dizziness.  DDx includes: Orthostatic hypotension Stroke Vertebral artery dissection/stenosis Vasovagal/neurocardiogenic syncope Aortic stenosis Valvular disorder/Cardiomyopathy Anemia  Pt comes in with cc of dizziness. Pt is a poor historian - but it seems like she is having constant dizziness x 2 days, worse with standing. Orthostatics are neg. Neck exam reveals no bruits and cerebellar exam and complete neuro exam is normal. Labs and imaging are normal as well.  Consulted Neuro given the vague, constant dizziness. They dont think this is a stroke, and request outpatient neurology f/u. Pt in agreement with the plan. Pt's gait revealed no unsteadiness.       Varney Biles, MD 05/24/14 0104

## 2014-05-24 NOTE — ED Notes (Signed)
Neurologist at bedside. 

## 2014-05-24 NOTE — Consult Note (Signed)
Reason for Consult:Dizziness and difficulty with speech Referring Physician: Rhunette Croft  CC: Dizziness and difficulty with speech  HPI: Carol Martin is an 73 y.o. female who presents with complaints of dizziness.  Much of history is obtained from the daughters.  It seems that for years the patient has had episodes that last 2-3 days of dizziness, mild confusion and difficulty getting her words out.  Although these have been mentioned to her PCP they have not been worked up.  About 2 days ago the patient had the onset of another episode.  Reported feeling worse with this episode so she was brought in for evaluation.   The patient describes her dizziness as a feeling as if she is about to pass out.  She does not describe vertigo.  It is worse when she is sitting or standing.  She also reports feeling cloudy.  Due to the cloudiness she is unable to think of what she wants to say.  Because of this her responses are delayed or sometimes confused.  Although her symptoms were worse yesterday she feels they are somewhat better today.   The patient lives with her daughter.  She has been diagnosed with dementia and tried on medications but responded poorly and was taken off medications. She seems to have times when she paranoid and other times when she does not know her daughters.  She needs assistance to make sure that she eats regularly.  They help her minimally with dressing.  She ambulates with a walker and is continent.   Past Medical History  Diagnosis Date  . Abnormal EKG   . Venous (peripheral) insufficiency   . Hypertension   . Kidney stones   . Arthritis of knee   . Sleep apnea     with insomnia  . Morbid obesity   . Breast cancer   . Alzheimer's disease 05/17/07  . GERD (gastroesophageal reflux disease)   . Allergic rhinitis   . Dilated aortic root   . Diastolic dysfunction   . Mild aortic insufficiency     Past Surgical History  Procedure Laterality Date  . Ectopic pregnancy surgery     . Cesarean section    . Kidney stone surgery      REMOVAL  . Mastectomy    . Dilation and curettage of uterus    . Tracheostomy      Family History  Problem Relation Age of Onset  . Hypertension Mother   . Hypertension Sister   . Cancer Sister     Unknown cancer    Social History:  reports that she has never smoked. She has never used smokeless tobacco. She reports that she does not drink alcohol or use illicit drugs.  No Known Allergies  Medications: I have reviewed the patient's current medications. Prior to Admission:  Current outpatient prescriptions: aspirin 81 MG tablet, Take 81 mg by mouth daily., Disp: , Rfl: ;   lisinopril-hydrochlorothiazide (PRINZIDE,ZESTORETIC) 20-12.5 MG per tablet, Take 1 tablet by mouth daily. , Disp: , Rfl: ;   Multiple Vitamin (MULTIVITAMIN WITH MINERALS) TABS tablet, Take 1 tablet by mouth daily., Disp: , Rfl: ;   verapamil (COVERA HS) 180 MG (CO) 24 hr tablet, Take 180 mg by mouth at bedtime.  , Disp: , Rfl:   ROS: History obtained from the patient  General ROS: negative for - chills, fatigue, fever, night sweats, weight gain or weight loss Psychological ROS: as noted in HPI Ophthalmic ROS: negative for - blurry vision, double vision, eye  pain or loss of vision ENT ROS: negative for - epistaxis, nasal discharge, oral lesions, sore throat, tinnitus or vertigo Allergy and Immunology ROS: negative for - hives or itchy/watery eyes Hematological and Lymphatic ROS: negative for - bleeding problems, bruising or swollen lymph nodes Endocrine ROS: negative for - galactorrhea, hair pattern changes, polydipsia/polyuria or temperature intolerance Respiratory ROS: negative for - cough, hemoptysis, shortness of breath or wheezing Cardiovascular ROS: bilateral lower extremity edema Gastrointestinal ROS: negative for - abdominal pain, diarrhea, hematemesis, nausea/vomiting or stool incontinence Genito-Urinary ROS: negative for - dysuria, hematuria,  incontinence or urinary frequency/urgency Musculoskeletal ROS: joint pain due to arthritis Neurological ROS: as noted in HPI Dermatological ROS: negative for rash and skin lesion changes  Physical Examination: Blood pressure 144/72, pulse 63, temperature 98.8 F (37.1 C), temperature source Oral, resp. rate 21, height $RemoveBe'5\' 4"'uOyvgVWkx$  (1.626 m), weight 127.007 kg (280 lb), SpO2 96.00%.  Neurologic Examination Mental Status: Alert, oriented, thought content appropriate.  Speech fluent without evidence of aphasia.  Able to follow 3 step commands without difficulty. Cranial Nerves: II: Discs flat bilaterally; Visual fields grossly normal, pupils equal, round, reactive to light and accommodation III,IV, VI: ptosis not present, extra-ocular motions intact bilaterally V,VII: smile symmetric, facial light touch sensation normal bilaterally VIII: hearing normal bilaterally IX,X: gag reflex present XI: bilateral shoulder shrug XII: midline tongue extension Motor: Right : Upper extremity   5/5    Left:     Upper extremity   5/5 Has difficulty lifting both lower extremities due to edema, left greater than right Tone and bulk:normal tone throughout; no atrophy noted Sensory: Pinprick and light touch intact throughout, bilaterally Deep Tendon Reflexes: 2+ in the upper extremities and absent in the lower extremities Plantars: Right: downgoing   Left: downgoing Cerebellar: normal finger-to-nose testing bilaterally Gait: Unable to test CV: pulses palpable throughout    Laboratory Studies:   Basic Metabolic Panel:  Recent Labs Lab 05/23/14 2225  NA 142  K 3.5*  CL 104  CO2 27  GLUCOSE 90  BUN 11  CREATININE 0.61  CALCIUM 9.1    Liver Function Tests: No results found for this basename: AST, ALT, ALKPHOS, BILITOT, PROT, ALBUMIN,  in the last 168 hours No results found for this basename: LIPASE, AMYLASE,  in the last 168 hours No results found for this basename: AMMONIA,  in the last 168  hours  CBC:  Recent Labs Lab 05/23/14 2225  WBC 5.6  NEUTROABS 2.6  HGB 13.7  HCT 41.0  MCV 92.1  PLT 188    Cardiac Enzymes:  Recent Labs Lab 05/23/14 2225  TROPONINI <0.30    BNP: No components found with this basename: POCBNP,   CBG: No results found for this basename: GLUCAP,  in the last 168 hours  Microbiology: No results found for this or any previous visit.  Coagulation Studies: No results found for this basename: LABPROT, INR,  in the last 72 hours  Urinalysis:  Recent Labs Lab 05/23/14 [place]  LABSPEC 1.006  PHURINE 7.0  GLUCOSEU NEGATIVE  HGBUR NEGATIVE  BILIRUBINUR NEGATIVE  KETONESUR NEGATIVE  PROTEINUR NEGATIVE  UROBILINOGEN 0.2  NITRITE NEGATIVE  LEUKOCYTESUR TRACE*    Lipid Panel:     Component Value Date/Time   CHOL 124 05/22/2011 1354   TRIG 60.0 05/22/2011 1354   HDL 50.80 05/22/2011 1354   CHOLHDL 2 05/22/2011 1354   VLDL 12.0 05/22/2011 1354   LDLCALC 61 05/22/2011 1354    HgbA1C:  No results found  for this basename: HGBA1C    Urine Drug Screen:   No results found for this basename: labopia, cocainscrnur, labbenz, amphetmu, thcu, labbarb    Alcohol Level: No results found for this basename: ETH,  in the last 168 hours  Other results: EKG: sinus rhythm at 60 bpm.  Imaging: Ct Head Wo Contrast  05/23/2014   CLINICAL DATA:  Dizziness, headache  EXAM: CT HEAD WITHOUT CONTRAST  TECHNIQUE: Contiguous axial images were obtained from the base of the skull through the vertex without intravenous contrast.  COMPARISON:  None.  FINDINGS: No skull fracture is noted. Paranasal sinuses and mastoid air cells are unremarkable. Atherosclerotic calcifications of carotid siphon. No intracranial hemorrhage, mass effect or midline shift. Mild cerebral atrophy. Mild periventricular white matter decreased attenuation probable due to chronic small vessel ischemic changes. No acute cortical infarction. There is prominent size sella  region with CSF density. Although this may be due to empty sella I cannot exclude a arachnoid cyst at this level. Further correlation with MRI is recommended.  IMPRESSION: No intracranial hemorrhage, mass effect or midline shift. Mild cerebral atrophy. Mild periventricular white matter decreased attenuation probable due to chronic small vessel ischemic changes. No acute cortical infarction. There is prominent size sella region with CSF density. Although this may be due to empty sella I cannot exclude a arachnoid cyst at this level. Further correlation with MRI is recommended.   Electronically Signed   By: Lahoma Crocker M.D.   On: 05/23/2014 19:03     Assessment/Plan: 73 year old female presenting with complaints of dizziness and difficulty with speech.  She has been having these episodes for years.  It does not seem that she has had a work up for these or even a formal work up for what sounds like a dementia.  The two may be related and can not rule out the possibility of a vascular dementia.  Neurological examination does not reveal any focal abnormalities, though.  Head CT reviewed and does show some evidence of chronic small vessel disease.  Echocardiogram has been performed recently (June) and shows no intracardiac masses or thrombi.  Due to the improvement of her current symptoms and chronicity of complaints, do not feel that admission is warranted at this time but work up can be completed on an outpatient basis.    Recommendations: 1.  Patient to be followed up by neurology on an outpatient basis.  MRI of the brain and EEG to be ordered at that time.   2.  B12, ESR, VDRL, folate, TSH drawn tonight and to be followed up on at her neurology evaluation. 3.  Increase ASA to $Remo'325mg'gOcOx$  daily  Case discussed with Dr. Arlester Marker, MD [facility] [phone] 05/24/2014, 12:50 AM

## 2014-05-30 ENCOUNTER — Ambulatory Visit (INDEPENDENT_AMBULATORY_CARE_PROVIDER_SITE_OTHER): Payer: Medicare Other | Admitting: Neurology

## 2014-05-30 ENCOUNTER — Encounter: Payer: Self-pay | Admitting: Neurology

## 2014-05-30 VITALS — BP 139/86 | HR 71 | Ht 61.0 in | Wt 282.0 lb

## 2014-05-30 DIAGNOSIS — G459 Transient cerebral ischemic attack, unspecified: Secondary | ICD-10-CM

## 2014-05-30 DIAGNOSIS — R404 Transient alteration of awareness: Secondary | ICD-10-CM

## 2014-05-30 NOTE — Progress Notes (Signed)
[facility]    Provider:  Dr Janann Colonel Referring Provider: Philis Fendt, MD Primary Care Physician:  Philis Fendt, MD  CC:  dizziness  HPI:  Carol Martin is a 73 y.o. female here as a referral from Dr. Jeanie Cooks for dizziness and confusion  73y/o woman presents with complaints of dizziness. For years she has had episodes of intermittent periods lasting hours to 2 to 3 days of dizziness and mild confusion, difficulty getting her words out. Dizziness described as sensation of about to pass out. Denies vertigo. Worse with sitting or standing. Has difficulty finding the words she wants to say. During these episodes her hands will be moving in abnormal positions, daughter notes she will be smacking her lips. Has diagnosis of dementia, was tried on medications but unable to tolerate. Family notes difficulty with short term memory. Has moments of paranoia, no hallucinations. Ambulates with a walker.   Recent head done shows signs of small vessel disease.   Review of Systems: Out of a complete 14 system review, the patient complains of only the following symptoms, and all other reviewed systems are negative. + blurred vision, memory loss, confusion, headache, cough, swelling in legs  History   Social History  . Marital Status: Widowed    Spouse Name: N/A    Number of Children: N/A  . Years of Education: N/A   Occupational History  . Not on file.   Social History Main Topics  . Smoking status: Never Smoker   . Smokeless tobacco: Never Used  . Alcohol Use: No  . Drug Use: No  . Sexual Activity: No   Other Topics Concern  . Not on file   Social History Narrative  . No narrative on file    Family History  Problem Relation Age of Onset  . Hypertension Mother   . Hypertension Sister   . Cancer Sister     Unknown cancer    Past Medical History  Diagnosis Date  . Abnormal EKG   . Venous (peripheral) insufficiency   . Hypertension   . Kidney stones     . Arthritis of knee   . Sleep apnea     with insomnia  . Morbid obesity   . Breast cancer   . Alzheimer's disease 05/17/07  . GERD (gastroesophageal reflux disease)   . Allergic rhinitis   . Dilated aortic root   . Diastolic dysfunction   . Mild aortic insufficiency     Past Surgical History  Procedure Laterality Date  . Ectopic pregnancy surgery    . Cesarean section    . Kidney stone surgery      REMOVAL  . Mastectomy    . Dilation and curettage of uterus    . Tracheostomy      Current Outpatient Prescriptions  Medication Sig Dispense Refill  . aspirin 81 MG tablet Take 81 mg by mouth daily.      Marland Kitchen lisinopril-hydrochlorothiazide (PRINZIDE,ZESTORETIC) 20-12.5 MG per tablet Take 1 tablet by mouth daily.       . Multiple Vitamin (MULTIVITAMIN WITH MINERALS) TABS tablet Take 1 tablet by mouth daily.      . verapamil (COVERA HS) 180 MG (CO) 24 hr tablet Take 180 mg by mouth at bedtime.         No current facility-administered medications for this visit.    Allergies as of 05/30/2014  . (No Known Allergies)    Vitals: There were no vitals taken for this visit. Last Weight:  Wt  Readings from Last 1 Encounters:  05/23/14 280 lb (127.007 kg)   Last Height:   Ht Readings from Last 1 Encounters:  05/23/14 5\' 4"  (1.626 m)     Physical exam: Exam: Gen: NAD, conversant Eyes: anicteric sclerae, moist conjunctivae HENT: Atraumatic, oropharynx clear Neck: Trachea midline; supple,  Lungs: CTA, no wheezing, rales, rhonic                          CV: RRR, no MRG Abdomen: Soft, non-tender;  Extremities: No peripheral edema  Skin: Normal temperature, no rash,  Psych: Appropriate affect, pleasant  Neuro: Alert, oriented, thought content appropriate. Speech fluent without evidence of aphasia. Able to follow 3 step commands without difficulty.  Cranial Nerves:  II: Visual fields grossly normal, pupils equal, round, reactive to light and accommodation  III,IV, VI: ptosis  not present, extra-ocular motions intact bilaterally  V,VII: smile symmetric, facial light touch sensation normal bilaterally  VIII: hearing normal bilaterally  IX,X: gag reflex present  XI: bilateral shoulder shrug  XII: midline tongue extension  Motor:  Right : Upper extremity 5/5 Left: Upper extremity 5/5  Has difficulty lifting both lower extremities due to edema, left greater than right  Tone and bulk:normal tone throughout; no atrophy noted  Sensory: Pinprick and light touch intact throughout, bilaterally  Deep Tendon Reflexes: 2+ in the upper extremities and absent in the lower extremities  Plantars:  Right: downgoing Left: downgoing  Cerebellar:  normal finger-to-nose testing bilaterally  Gait: Unable to test  CV: pulses palpable throughout    Assessment:  After physical and neurologic examination, review of laboratory studies, imaging, neurophysiology testing and pre-existing records, assessment will be reviewed on the problem list.  Plan:  Treatment plan and additional workup will be reviewed under Problem List.  1)Altered mental status 2)Cognitive decline  73y/o woman presenting for initial evaluation of recurrent transient episodes of altered mental status. Unclear etiology of these events, could represent complex partial seizure vs TIA. Could also be related to underlying dementia. Head CT shows signs of possible small vessel disease. Will check brain MRI, EEG. Will check MOCA at next visit. Will check B12, MMA, TSH. Follow up once workup completed.   Jim Like, DO  [facility] [address] [place],  [address]  Phone [phone] Fax [phone]

## 2014-05-30 NOTE — Patient Instructions (Signed)
Overall you are doing fairly well but I do want to suggest a few things today:   Remember to drink plenty of fluid, eat healthy meals and do not skip any meals. Try to eat protein with a every meal and eat a healthy snack such as fruit or nuts in between meals. Try to keep a regular sleep-wake schedule and try to exercise daily, particularly in the form of walking, 20-30 minutes a day, if you can.   As far as diagnostic testing:  1)Please schedule an EEG 2)We will call you to schedule a MRI of your brain  We will follow up once the above workup is completed.  Please call us with any interim questions, concerns, problems, updates or refill requests.   My clinical assistant and will answer any of your questions and relay your messages to me and also relay most of my messages to you.   Our phone number is [phone]. We also have an after hours call service for urgent matters and there is a physician on-call for urgent questions. For any emergencies you know to call 911 or go to the nearest emergency room

## 2014-06-12 ENCOUNTER — Ambulatory Visit
Admission: RE | Admit: 2014-06-12 | Discharge: 2014-06-12 | Disposition: A | Discharge status: Home / Self Care | Payer: Medicare Other | Source: Ambulatory Visit | Attending: Neurology | Admitting: Neurology

## 2014-06-12 DIAGNOSIS — R404 Transient alteration of awareness: Secondary | ICD-10-CM

## 2014-06-12 DIAGNOSIS — G459 Transient cerebral ischemic attack, unspecified: Secondary | ICD-10-CM

## 2014-09-11 ENCOUNTER — Other Ambulatory Visit: Payer: Self-pay | Admitting: Obstetrics and Gynecology

## 2014-09-13 ENCOUNTER — Other Ambulatory Visit: Payer: Self-pay | Admitting: Obstetrics and Gynecology

## 2014-09-13 DIAGNOSIS — Z1231 Encounter for screening mammogram for malignant neoplasm of breast: Secondary | ICD-10-CM

## 2014-09-13 DIAGNOSIS — Z9011 Acquired absence of right breast and nipple: Secondary | ICD-10-CM

## 2014-09-13 LAB — CYTOLOGY - PAP

## 2015-03-12 ENCOUNTER — Ambulatory Visit
Admission: RE | Admit: 2015-03-12 | Discharge: 2015-03-12 | Disposition: A | Discharge status: Home / Self Care | Payer: Medicare Other | Source: Ambulatory Visit | Attending: Obstetrics and Gynecology | Admitting: Obstetrics and Gynecology

## 2015-03-12 DIAGNOSIS — Z9011 Acquired absence of right breast and nipple: Secondary | ICD-10-CM

## 2015-03-12 DIAGNOSIS — Z1231 Encounter for screening mammogram for malignant neoplasm of breast: Secondary | ICD-10-CM

## 2015-08-09 IMAGING — MG MM SCREENING MAMMOGRAM LEFT
6 series · 6 of 6 positions shown · non-contrast
Comparison: Previous exam(s).

CLINICAL DATA: Screening.

EXAM:
DIGITAL SCREENING UNILATERAL LEFT MAMMOGRAM WITH CAD

[L CC (1 of 3)]
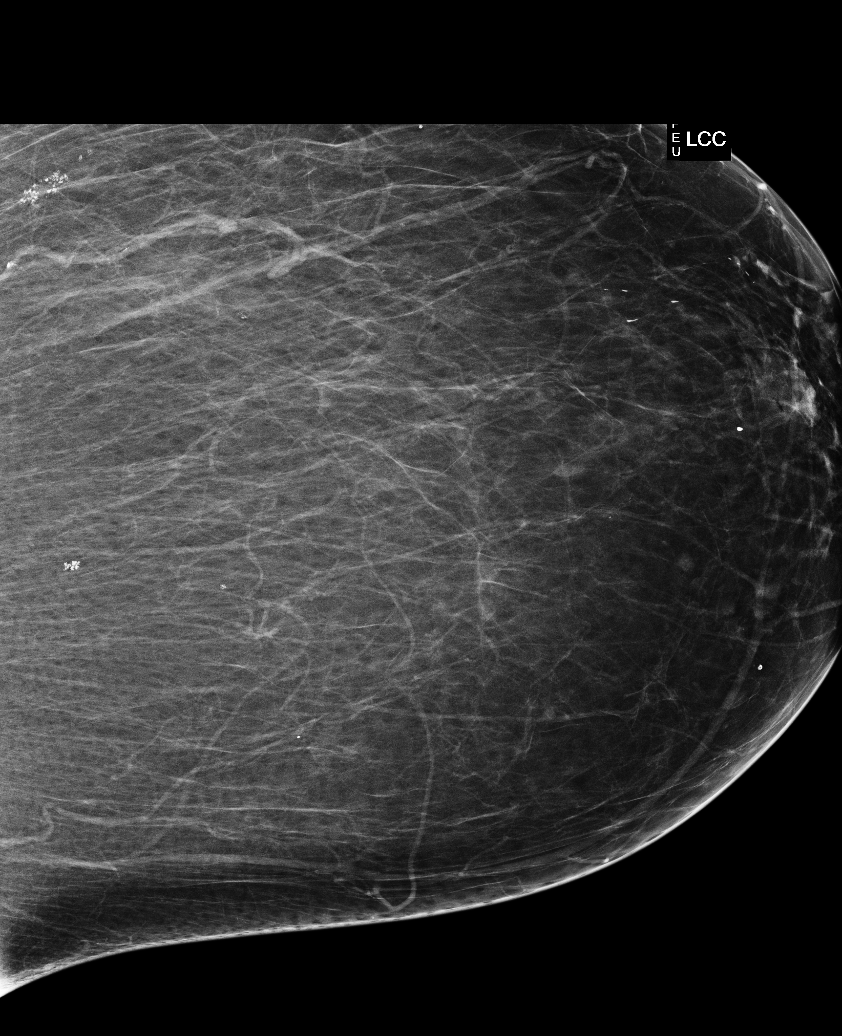

[L MLO (1 of 3)]
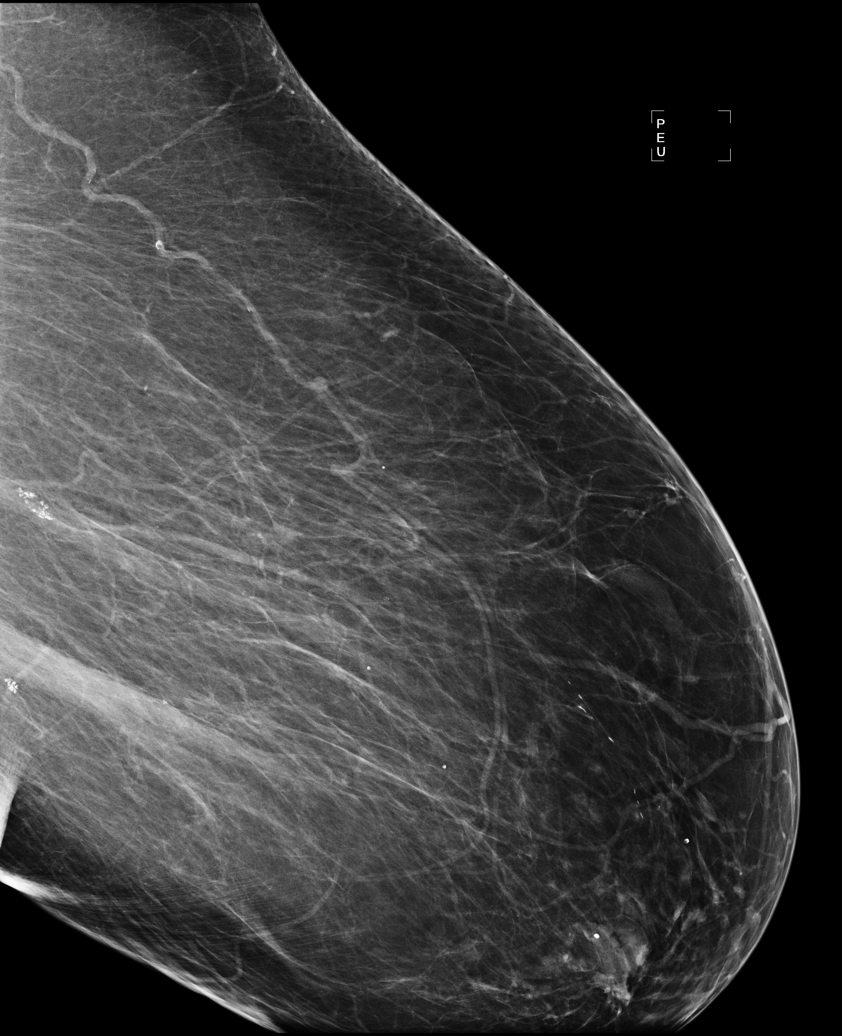

[L CC (2 of 3)]
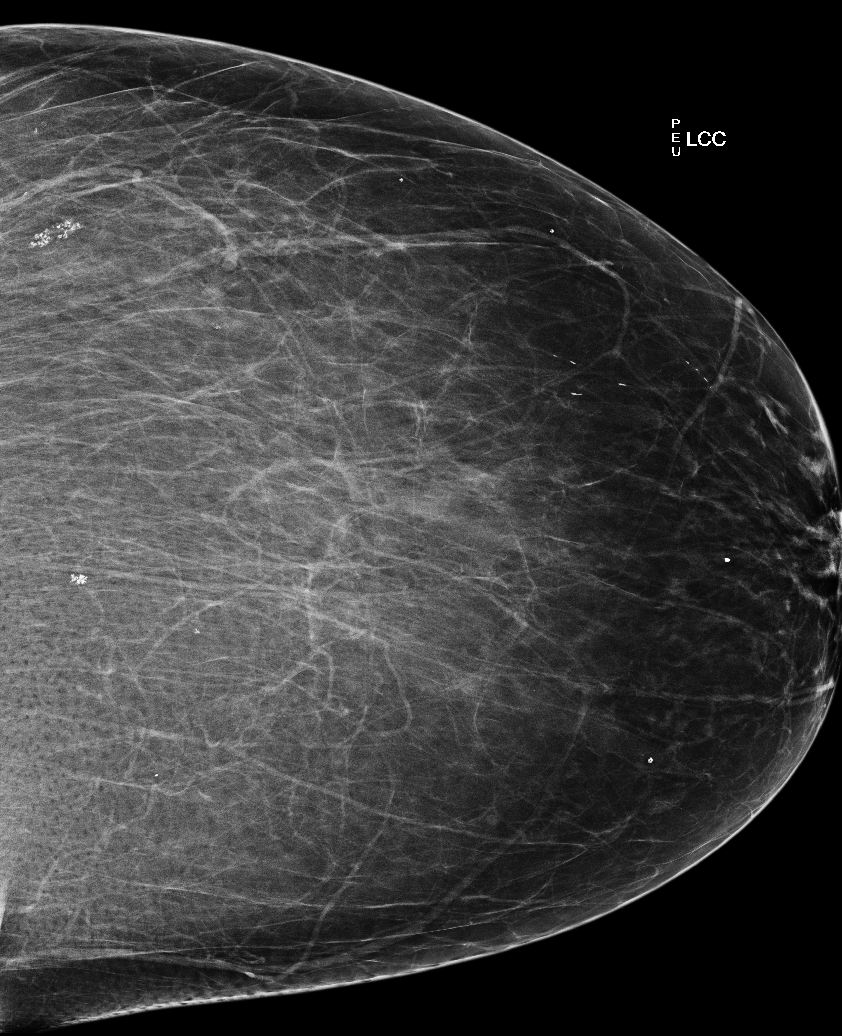

[L CC (3 of 3)]
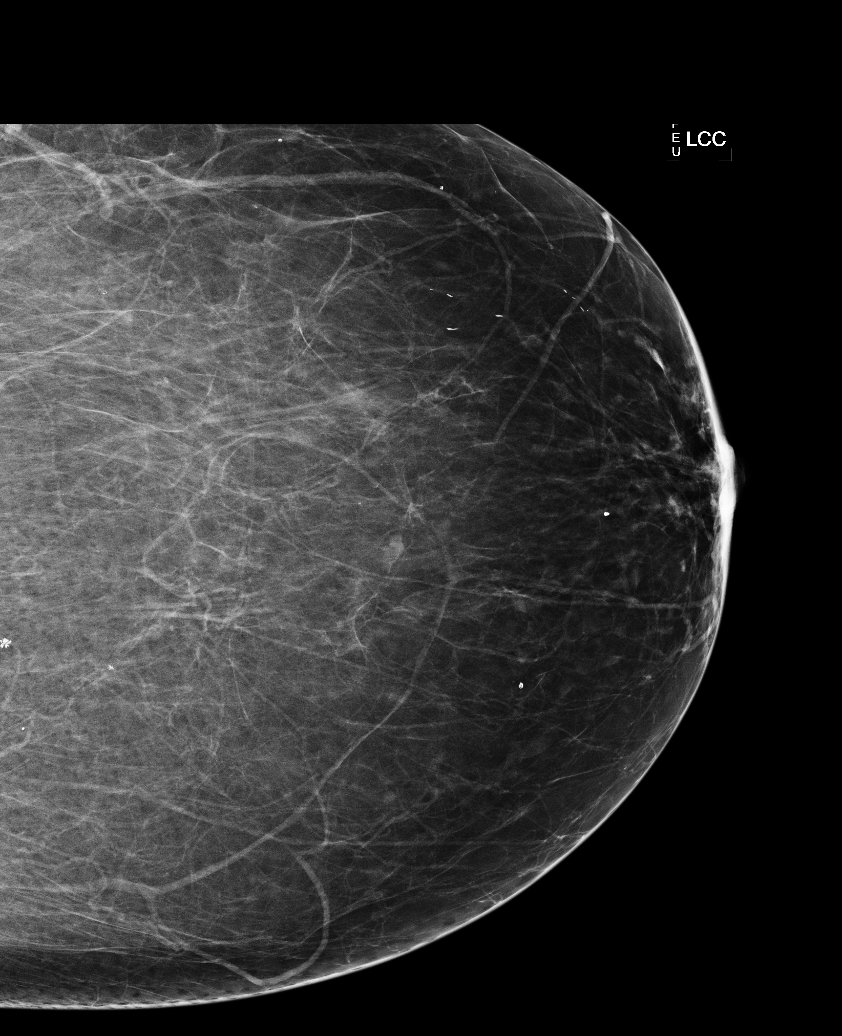

[L MLO (2 of 3)]
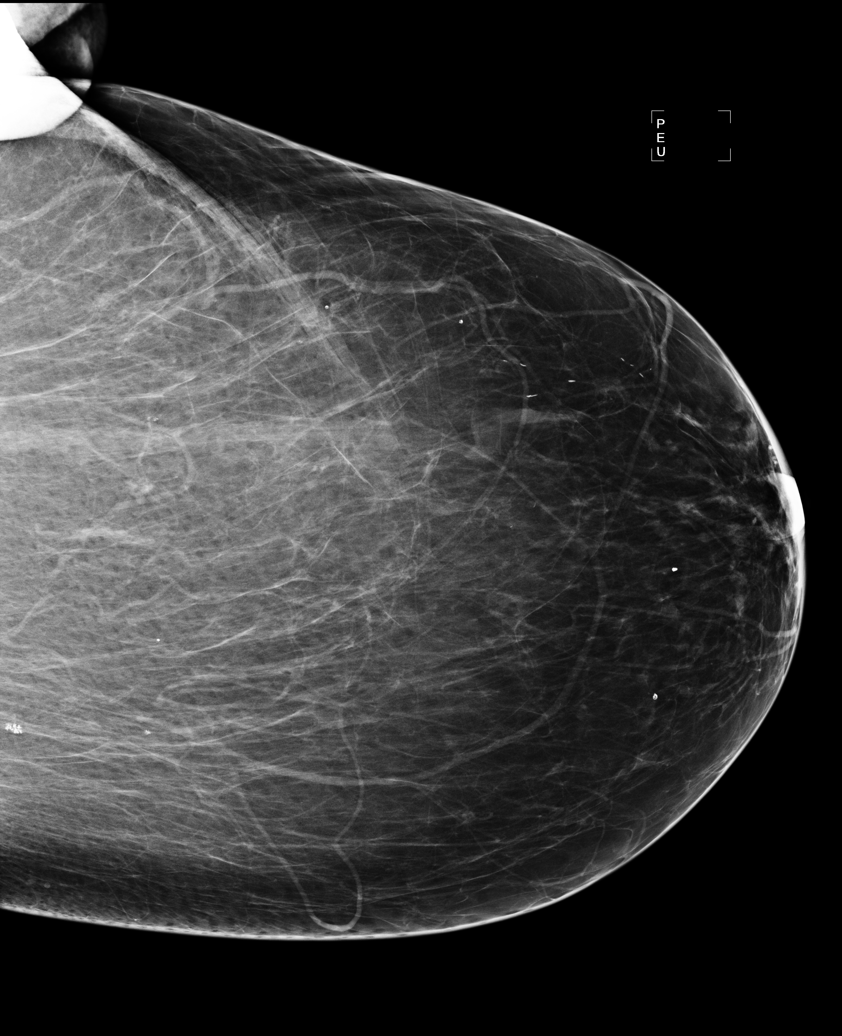

[L MLO (3 of 3)]
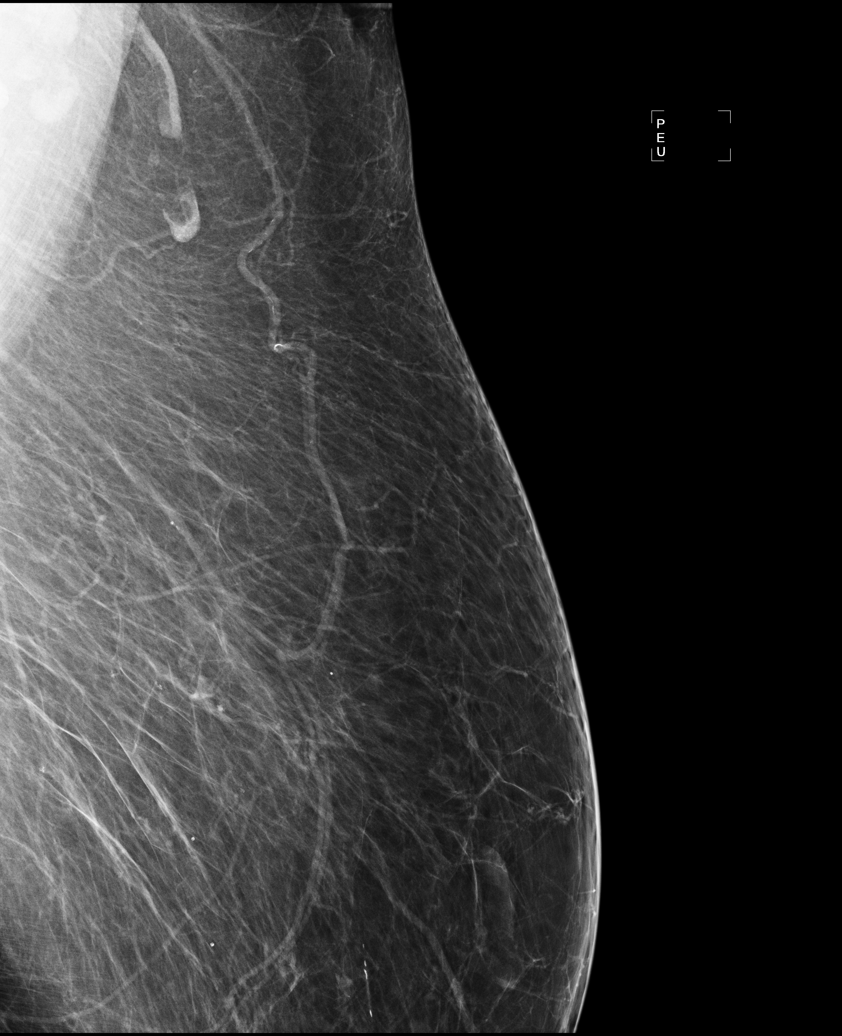

[6 of 6 positions shown; findings below may reference images not displayed]

ACR Breast Density Category b: There are scattered areas of
fibroglandular density.
FINDINGS: The patient has had a right mastectomy. There are no findings
suspicious for malignancy. Images were processed with CAD.
IMPRESSION: No mammographic evidence of malignancy. A result letter of this
screening mammogram will be mailed directly to the patient.

RECOMMENDATION:
Screening mammogram in one year. (Code:MN-U-5YK)

BI-RADS CATEGORY  1: Negative.

## 2016-04-28 ENCOUNTER — Encounter (HOSPITAL_COMMUNITY): Payer: Self-pay | Admitting: *Deleted

## 2016-04-28 ENCOUNTER — Observation Stay (HOSPITAL_COMMUNITY)
Admission: EM | Admit: 2016-04-28 | Discharge: 2016-04-29 | Disposition: A | Discharge status: Home / Self Care | Payer: Medicare Other | Attending: Family Medicine | Admitting: Family Medicine

## 2016-04-28 DIAGNOSIS — L98499 Non-pressure chronic ulcer of skin of other sites with unspecified severity: Secondary | ICD-10-CM | POA: Diagnosis present

## 2016-04-28 DIAGNOSIS — R0602 Shortness of breath: Secondary | ICD-10-CM | POA: Diagnosis not present

## 2016-04-28 DIAGNOSIS — B354 Tinea corporis: Secondary | ICD-10-CM | POA: Diagnosis not present

## 2016-04-28 DIAGNOSIS — Z7982 Long term (current) use of aspirin: Secondary | ICD-10-CM | POA: Insufficient documentation

## 2016-04-28 DIAGNOSIS — G4730 Sleep apnea, unspecified: Secondary | ICD-10-CM | POA: Diagnosis present

## 2016-04-28 DIAGNOSIS — I10 Essential (primary) hypertension: Secondary | ICD-10-CM | POA: Diagnosis present

## 2016-04-28 DIAGNOSIS — I77810 Thoracic aortic ectasia: Secondary | ICD-10-CM | POA: Diagnosis present

## 2016-04-28 DIAGNOSIS — M179 Osteoarthritis of knee, unspecified: Secondary | ICD-10-CM | POA: Insufficient documentation

## 2016-04-28 DIAGNOSIS — Z79899 Other long term (current) drug therapy: Secondary | ICD-10-CM | POA: Insufficient documentation

## 2016-04-28 DIAGNOSIS — L03311 Cellulitis of abdominal wall: Secondary | ICD-10-CM | POA: Diagnosis present

## 2016-04-28 DIAGNOSIS — R2681 Unsteadiness on feet: Secondary | ICD-10-CM | POA: Insufficient documentation

## 2016-04-28 DIAGNOSIS — G309 Alzheimer's disease, unspecified: Secondary | ICD-10-CM | POA: Insufficient documentation

## 2016-04-28 DIAGNOSIS — I5032 Chronic diastolic (congestive) heart failure: Secondary | ICD-10-CM | POA: Diagnosis present

## 2016-04-28 DIAGNOSIS — L03319 Cellulitis of trunk, unspecified: Secondary | ICD-10-CM | POA: Diagnosis present

## 2016-04-28 LAB — CBC WITH DIFFERENTIAL/PLATELET
BASOS PCT: 1 %
Basophils Absolute: 0 10*3/uL (ref 0.0–0.1)
EOS ABS: 0.2 10*3/uL (ref 0.0–0.7)
Eosinophils Relative: 4 %
HCT: 39.8 % (ref 36.0–46.0)
HEMOGLOBIN: 13.1 g/dL (ref 12.0–15.0)
Lymphocytes Relative: 43 %
Lymphs Abs: 2.4 10*3/uL (ref 0.7–4.0)
MCH: 30.3 pg (ref 26.0–34.0)
MCHC: 32.9 g/dL (ref 30.0–36.0)
MCV: 91.9 fL (ref 78.0–100.0)
Monocytes Absolute: 0.5 10*3/uL (ref 0.1–1.0)
Monocytes Relative: 8 %
NEUTROS PCT: 44 %
Neutro Abs: 2.6 10*3/uL (ref 1.7–7.7)
Platelets: 247 10*3/uL (ref 150–400)
RBC: 4.33 MIL/uL (ref 3.87–5.11)
RDW: 13 % (ref 11.5–15.5)
WBC: 5.7 10*3/uL (ref 4.0–10.5)

## 2016-04-28 LAB — I-STAT CHEM 8, ED
BUN: <3 mg/dL — ABNORMAL LOW (ref 6–20)
CHLORIDE: 104 mmol/L (ref 101–111)
Calcium, Ion: 1.14 mmol/L (ref 1.13–1.30)
Creatinine, Ser: 0.5 mg/dL (ref 0.44–1.00)
Glucose, Bld: 86 mg/dL (ref 65–99)
HEMATOCRIT: 40 % (ref 36.0–46.0)
Hemoglobin: 13.6 g/dL (ref 12.0–15.0)
Potassium: 3.6 mmol/L (ref 3.5–5.1)
SODIUM: 142 mmol/L (ref 135–145)
TCO2: 26 mmol/L (ref 0–100)

## 2016-04-28 MED ORDER — TERBINAFINE HCL 250 MG PO TABS
250.0000 mg | ORAL_TABLET | ORAL | Status: AC
  Administered 2016-04-29: 250 mg via ORAL
  Filled 2016-04-28: qty 1

## 2016-04-28 MED ORDER — ITRACONAZOLE 100 MG PO CAPS
200.0000 mg | ORAL_CAPSULE | Freq: Once | ORAL | Status: DC
  Filled 2016-04-28: qty 2

## 2016-04-28 NOTE — ED Notes (Signed)
Pt states that she has been being treat for a yeast rash to her groin / abdominal area for 9 months; pt states that she has developed an open area to the rt side under her panis; pt c/o itching and soreness to the area

## 2016-04-28 NOTE — ED Provider Notes (Signed)
CSN: [id number]     Arrival date & time 04/28/16  2047 History  By signing my name below, I, Ephriam Jenkins, attest that this documentation has been prepared under the direction and in the presence of Everlene Balls, MD. Electronically signed, Ephriam Jenkins, ED Scribe. 04/28/2016. 11:28 PM.   Chief Complaint  Patient presents with  . Skin Ulcer    The history is provided by the patient. No language interpreter was used.   HPI Comments: Carol Martin is a 75 y.o. female who presents to the Emergency Department complaining of gradually worsening, multiple erythematous areas to her abdominal area and under her left breast. Pt describes the rash as painful and pruritic. Pt states she has been seen by a dermatologist for the past 9 months. Pt reports that she was given steroids, steroid cream and antifungal cream with mild relief at first but states the rash is starting to become worse. Pt states she has developed an open area on her right abdomen that has started to look like a white head pimple within the last week. Pt also reports associated drainage from the site.  She denies any fevers.  Past Medical History  Diagnosis Date  . Abnormal EKG   . Venous (peripheral) insufficiency   . Hypertension   . Kidney stones   . Arthritis of knee   . Sleep apnea     with insomnia  . Morbid obesity ([place])   . Breast cancer ([place])   . Alzheimer's disease 05/17/07  . GERD (gastroesophageal reflux disease)   . Allergic rhinitis   . Dilated aortic root ([place])   . Diastolic dysfunction   . Mild aortic insufficiency    Past Surgical History  Procedure Laterality Date  . Ectopic pregnancy surgery    . Cesarean section    . Kidney stone surgery      REMOVAL  . Mastectomy    . Dilation and curettage of uterus    . Tracheostomy     Family History  Problem Relation Age of Onset  . Hypertension Mother   . Hypertension Sister   . Cancer Sister     Unknown cancer   Social History  Substance Use Topics  .  Smoking status: Never Smoker   . Smokeless tobacco: Never Used  . Alcohol Use: No   OB History    No data available     Review of Systems  A complete 10 system review of systems was obtained and all systems are negative except as noted in the HPI and PMH.   Allergies  Review of patient's allergies indicates no known allergies.  Home Medications   Prior to Admission medications   Medication Sig Start Date End Date Taking? Authorizing Provider  aspirin 81 MG tablet Take 81 mg by mouth daily.   Yes Historical Provider, MD  ketoconazole (NIZORAL) 2 % cream Apply 1 application topically daily.   Yes Historical Provider, MD  lisinopril-hydrochlorothiazide (PRINZIDE,ZESTORETIC) 20-12.5 MG per tablet Take 1 tablet by mouth daily.  01/30/13  Yes Historical Provider, MD  Multiple Vitamin (MULTIVITAMIN WITH MINERALS) TABS tablet Take 1 tablet by mouth daily.   Yes Historical Provider, MD  verapamil (COVERA HS) 180 MG (CO) 24 hr tablet Take 180 mg by mouth at bedtime.     Yes Historical Provider, MD   BP 170/89 mmHg  Pulse 80  Temp(Src) 98.9 F (37.2 C) (Oral)  Resp 18  SpO2 95% Physical Exam  Constitutional: She is oriented to person, place, and  time. She appears well-developed and well-nourished. No distress.  [place]    HENT:  Head: Normocephalic and atraumatic.  Nose: Nose normal.  Mouth/Throat: Oropharynx is clear and moist. No oropharyngeal exudate.  Eyes: Conjunctivae and EOM are normal. Pupils are equal, round, and reactive to light. No scleral icterus.  Neck: Normal range of motion. Neck supple. No JVD present. No tracheal deviation present. No thyromegaly present.  Cardiovascular: Normal rate, regular rhythm and normal heart sounds.  Exam reveals no gallop and no friction rub.   No murmur heard. Pulmonary/Chest: Effort normal and breath sounds normal. No respiratory distress. She has no wheezes. She exhibits no tenderness.  Abdominal: Soft. Bowel sounds are normal. She  exhibits no distension and no mass. There is no tenderness. There is no rebound and no guarding.  Musculoskeletal: Normal range of motion. She exhibits edema. She exhibits no tenderness.  Bilateral lower extremities edema.  Lymphadenopathy:    She has no cervical adenopathy.  Neurological: She is alert and oriented to person, place, and time. No cranial nerve deficit. She exhibits normal muscle tone.  Skin: Skin is warm and dry. Rash noted. There is erythema. No pallor.  Erthamatous and purple rash lower abdomen groin and bilateral upper thighs, weeping, area of ulceration on her abdomen with prulent discharge  Nursing note and vitals reviewed.   ED Course  Procedures  DIAGNOSTIC STUDIES: Oxygen Saturation is 95% on RA, normal by my interpretation.  COORDINATION OF CARE: 11:11 PM-Will order IV anti-fungal. Discussed treatment plan with pt at bedside and pt agreed to plan.   Labs Review Labs Reviewed  I-STAT CHEM 8, ED - Abnormal; Notable for the following:    BUN <3 (*)    All other components within normal limits  CBC WITH DIFFERENTIAL/PLATELET    Imaging Review No results found. I have personally reviewed and evaluated these images and lab results as part of my medical decision-making.   EKG Interpretation None      MDM   Final diagnoses:  None    Patient presents to the ED for worsening rash with ulceration.  She has failed outpatient cream therapy x 9 months.  She will need oral or IV treatment for this. She was given terbinafine in the ED for treatment.  Due to the purulent ulceration, patient should stay for obs and evaluation of improvement. Will page hospitalist for admission.  12:44 AM Dr. Porfirio Mylar accepts the patient for further care.   I personally performed the services described in this documentation, which was scribed in my presence. The recorded information has been reviewed and is accurate.      Everlene Balls, MD 04/29/16 [phone]

## 2016-04-29 DIAGNOSIS — L03311 Cellulitis of abdominal wall: Secondary | ICD-10-CM | POA: Diagnosis present

## 2016-04-29 DIAGNOSIS — I10 Essential (primary) hypertension: Secondary | ICD-10-CM

## 2016-04-29 DIAGNOSIS — B354 Tinea corporis: Secondary | ICD-10-CM | POA: Diagnosis not present

## 2016-04-29 DIAGNOSIS — L03319 Cellulitis of trunk, unspecified: Secondary | ICD-10-CM | POA: Diagnosis present

## 2016-04-29 DIAGNOSIS — I5032 Chronic diastolic (congestive) heart failure: Secondary | ICD-10-CM

## 2016-04-29 DIAGNOSIS — G4730 Sleep apnea, unspecified: Secondary | ICD-10-CM

## 2016-04-29 LAB — BASIC METABOLIC PANEL
ANION GAP: 7 (ref 5–15)
CALCIUM: 8.4 mg/dL — AB (ref 8.9–10.3)
CO2: 26 mmol/L (ref 22–32)
Chloride: 106 mmol/L (ref 101–111)
Creatinine, Ser: 0.5 mg/dL (ref 0.44–1.00)
GFR calc Af Amer: >60 mL/min (ref >60)
GLUCOSE: 95 mg/dL (ref 65–99)
POTASSIUM: 3.5 mmol/L (ref 3.5–5.1)
Sodium: 139 mmol/L (ref 135–145)

## 2016-04-29 LAB — CBC
HEMATOCRIT: 38.6 % (ref 36.0–46.0)
HEMOGLOBIN: 13.3 g/dL (ref 12.0–15.0)
MCH: 30.9 pg (ref 26.0–34.0)
MCHC: 34.5 g/dL (ref 30.0–36.0)
MCV: 89.8 fL (ref 78.0–100.0)
Platelets: 237 10*3/uL (ref 150–400)
RBC: 4.3 MIL/uL (ref 3.87–5.11)
RDW: 12.9 % (ref 11.5–15.5)
WBC: 6.3 10*3/uL (ref 4.0–10.5)

## 2016-04-29 LAB — BRAIN NATRIURETIC PEPTIDE: B NATRIURETIC PEPTIDE 5: 31.3 pg/mL (ref 0.0–100.0)

## 2016-04-29 LAB — LACTIC ACID, PLASMA: Lactic Acid, Venous: 1.1 mmol/L (ref 0.5–1.9)

## 2016-04-29 MED ORDER — ADULT MULTIVITAMIN W/MINERALS CH
1.0000 | ORAL_TABLET | Freq: Every day | ORAL | Status: DC
  Administered 2016-04-29: 1 via ORAL
  Filled 2016-04-29: qty 1

## 2016-04-29 MED ORDER — KETOCONAZOLE 2 % EX CREA
1.0000 "application " | TOPICAL_CREAM | Freq: Every day | CUTANEOUS | Status: DC
  Administered 2016-04-29: 1 via TOPICAL
  Filled 2016-04-29: qty 15

## 2016-04-29 MED ORDER — ONDANSETRON HCL 4 MG PO TABS
4.0000 mg | ORAL_TABLET | Freq: Four times a day (QID) | ORAL | Status: DC | PRN
Start: 2016-04-29 — End: 2016-04-29

## 2016-04-29 MED ORDER — ACETAMINOPHEN 325 MG PO TABS: 650.0000 mg | ORAL_TABLET | Freq: Four times a day (QID) | ORAL | Status: DC | PRN

## 2016-04-29 MED ORDER — VERAPAMIL HCL ER 180 MG PO TBCR
180.0000 mg | EXTENDED_RELEASE_TABLET | Freq: Every day | ORAL | Status: DC
  Filled 2016-04-29: qty 1

## 2016-04-29 MED ORDER — ASPIRIN EC 81 MG PO TBEC
81.0000 mg | DELAYED_RELEASE_TABLET | Freq: Every day | ORAL | Status: DC
  Administered 2016-04-29: 81 mg via ORAL
  Filled 2016-04-29: qty 1

## 2016-04-29 MED ORDER — DOXYCYCLINE HYCLATE 100 MG PO TABS
100.0000 mg | ORAL_TABLET | Freq: Two times a day (BID) | ORAL | Status: DC
  Administered 2016-04-29 (×2): 100 mg via ORAL
  Filled 2016-04-29 (×2): qty 1

## 2016-04-29 MED ORDER — LISINOPRIL 20 MG PO TABS
20.0000 mg | ORAL_TABLET | Freq: Every day | ORAL | Status: DC
  Administered 2016-04-29: 20 mg via ORAL
  Filled 2016-04-29 (×2): qty 1

## 2016-04-29 MED ORDER — TERBINAFINE HCL 250 MG PO TABS
250.0000 mg | ORAL_TABLET | Freq: Every day | ORAL | Status: DC
Start: 2016-04-29 — End: 2017-04-15

## 2016-04-29 MED ORDER — DOXYCYCLINE HYCLATE 100 MG PO TABS: 100.0000 mg | ORAL_TABLET | Freq: Two times a day (BID) | ORAL | Status: DC

## 2016-04-29 MED ORDER — ONDANSETRON HCL 4 MG/2ML IJ SOLN: 4.0000 mg | Freq: Four times a day (QID) | INTRAMUSCULAR | Status: DC | PRN

## 2016-04-29 MED ORDER — ENOXAPARIN SODIUM 40 MG/0.4ML ~~LOC~~ SOLN
40.0000 mg | SUBCUTANEOUS | Status: DC
  Administered 2016-04-29: 40 mg via SUBCUTANEOUS
  Filled 2016-04-29: qty 0.4

## 2016-04-29 MED ORDER — ACETAMINOPHEN 650 MG RE SUPP: 650.0000 mg | Freq: Four times a day (QID) | RECTAL | Status: DC | PRN

## 2016-04-29 MED ORDER — HYDROCHLOROTHIAZIDE 12.5 MG PO CAPS
12.5000 mg | ORAL_CAPSULE | Freq: Every day | ORAL | Status: DC
  Administered 2016-04-29: 12.5 mg via ORAL
  Filled 2016-04-29: qty 1

## 2016-04-29 MED ORDER — LISINOPRIL-HYDROCHLOROTHIAZIDE 20-12.5 MG PO TABS: 1.0000 | ORAL_TABLET | Freq: Every day | ORAL | Status: DC

## 2016-04-29 MED ORDER — TERBINAFINE HCL 250 MG PO TABS
250.0000 mg | ORAL_TABLET | Freq: Every day | ORAL | Status: DC
  Administered 2016-04-29: 250 mg via ORAL
  Filled 2016-04-29: qty 1

## 2016-04-29 MED ORDER — HYDRALAZINE HCL 20 MG/ML IJ SOLN: 5.0000 mg | INTRAMUSCULAR | Status: DC | PRN

## 2016-04-29 NOTE — ED Notes (Signed)
Hospitalist at bedside 

## 2016-04-29 NOTE — Evaluation (Signed)
Physical Therapy Evaluation Patient Details Name: Carol Martin MRN: [id number] DOB: 1941-03-30 Today's Date: 04/29/2016   History of Present Illness  TELLY Martin is a 75 y.o. female with medical history significant of hypertension, OSA on CPAP, obesity, breast cancer, Alzheimer's disease, dilated aorta or, aortic insufficiency, chronic diastolic congestive heart failure, who presents with rash in groin and breast area.  Clinical Impression  The patient is mobilizing well. Ambulated x 250'. Pt admitted with above diagnosis. Pt currently with functional limitations due to the deficits listed below (see PT Problem List).  Pt will benefit from skilled PT to increase their independence and safety with mobility to allow discharge to the venue listed below.       Follow Up Recommendations Home health PT;Supervision - Intermittent    Equipment Recommendations  None recommended by PT    Recommendations for Other Services       Precautions / Restrictions Precautions Precautions: Fall Restrictions Weight Bearing Restrictions: No      Mobility  Bed Mobility Overal bed mobility: Needs Assistance Bed Mobility: Supine to Sit     Supine to sit: Supervision     General bed mobility comments: patient rocks to get momentum to  scoot.  Transfers Overall transfer level: Needs assistance Equipment used: Rolling walker (2 wheeled) Transfers: Sit to/from Stand Sit to Stand: Min guard         General transfer comment: rocks to get momentum, no assist to rise   Ambulation/Gait Ambulation/Gait assistance: Min guard Ambulation Distance (Feet): 250 Feet Assistive device: Rolling walker (2 wheeled) Gait Pattern/deviations: Step-to pattern     General Gait Details: gait is steady, turns safely  Stairs            Wheelchair Mobility    Modified Rankin (Stroke Patients Only)       Balance                                             Pertinent  Vitals/Pain Pain Assessment: Faces Faces Pain Scale: Hurts little more Pain Location: L knee Pain Descriptors / Indicators: Discomfort Pain Intervention(s): Monitored during session    Home Living Family/patient expects to be discharged to:: Private residence Living Arrangements: Children Available Help at Discharge: Family Type of Home: House Home Access: Level entry     Home Layout: One level Home Equipment: Environmental consultant - 2 wheels      Prior Function Level of Independence: Independent with assistive device(s)               Hand Dominance        Extremity/Trunk Assessment   Upper Extremity Assessment: Generalized weakness           Lower Extremity Assessment: Generalized weakness      Cervical / Trunk Assessment: Normal  Communication   Communication: No difficulties  Cognition Arousal/Alertness: Awake/alert Behavior During Therapy: WFL for tasks assessed/performed Overall Cognitive Status: Within Functional Limits for tasks assessed                      General Comments      Exercises        Assessment/Plan    PT Assessment Patient needs continued PT services  PT Diagnosis Difficulty walking;Generalized weakness   PT Problem List Decreased strength;Decreased activity tolerance;Decreased mobility;Pain;Decreased knowledge of precautions;Decreased safety awareness;Decreased knowledge of use of DME  PT Treatment Interventions DME instruction;Gait training;Functional mobility training;Therapeutic activities;Therapeutic exercise;Patient/family education   PT Goals (Current goals can be found in the Care Plan section) Acute Rehab PT Goals Patient Stated Goal: to go home PT Goal Formulation: With patient Time For Goal Achievement: 05/13/16 Potential to Achieve Goals: Good    Frequency Min 3X/week   Barriers to discharge        Co-evaluation               End of Session Equipment Utilized During Treatment: Gait belt   Patient left:  in chair;with call bell/phone within reach;with chair alarm set Nurse Communication: Mobility status    Functional Assessment Tool Used: clinical judgement Functional Limitation: Mobility: Walking and moving around Mobility: Walking and Moving Around Current Status [phone]): At least 1 percent but less than 20 percent impaired, limited or restricted Mobility: Walking and Moving Around Goal Status [phone]): 0 percent impaired, limited or restricted    Time: 0922-0938 PT Time Calculation (min) (ACUTE ONLY): 16 min   Charges:   PT Evaluation $PT Eval Low Complexity: 1 Procedure     PT G Codes:   PT G-Codes **NOT FOR INPATIENT CLASS** Functional Assessment Tool Used: clinical judgement Functional Limitation: Mobility: Walking and moving around Mobility: Walking and Moving Around Current Status [id number]): At least 1 percent but less than 20 percent impaired, limited or restricted Mobility: Walking and Moving Around Goal Status [phone]): 0 percent impaired, limited or restricted    [name] 04/29/2016, 11:49 AM Tresa Endo PT [phone]

## 2016-04-29 NOTE — H&P (Signed)
History and Physical    Carol Martin [id number] DOB: [birth year] DOA: 04/28/2016  Referring MD/NP/PA:   PCP: Philis Fendt, MD   Patient coming from:  The patient is coming from home.  At baseline, pt is partially dependent for most of ADL.   Chief Complaint: rash  HPI: Carol Martin is a 75 y.o. female with medical history significant of hypertension, OSA on CPAP, obesity, breast cancer, Alzheimer's disease, dilated aorta or, aortic insufficiency, chronic diastolic congestive heart failure, who presents with rash.  Pt states that she has been having pruritic rashes in groin areas, unde her breast and and over abdominal wall for long time. She describes the rash as painful and erythematous. Pt states she has been seen by a dermatologist for the past 9 months. Pt reports that she was given steroids, steroid cream and antifungal cream with mild relief initially,  but states the rash is starting to become worse recently. Pt states that last week, she developed an white head pimple over right lower abdominal wall, which has progressed to form an open wound with pus drainage from the site. She denies any fevers or chills. No nausea, vomiting, area, abdominal pain. No chest pain, cough, symptoms of UTI.  ED Course: pt was found to have WBC 5.7, lactic acid 1.1, temperature normal, no tachycardia, no tachypnea, electrolytes and renal function okay. Patient is placed on MedSurg bed for observation.   Review of Systems:   General: no fevers, chills, no changes in body weight, has fatigue HEENT: no blurry vision, hearing changes or sore throat Pulm: no dyspnea, coughing, wheezing CV: no chest pain, no palpitations Abd: no nausea, vomiting, abdominal pain, diarrhea, constipation GU: no dysuria, burning on urination, increased urinary frequency, hematuria  Ext: no leg edema Neuro: no unilateral weakness, numbness, or tingling, no vision change or hearing loss Skin: has rash MSK: No muscle  spasm, no deformity, no limitation of range of movement in spin Heme: No easy bruising.  Travel history: No recent long distant travel.  Allergy: No Known Allergies  Past Medical History  Diagnosis Date  . Abnormal EKG   . Venous (peripheral) insufficiency   . Hypertension   . Kidney stones   . Arthritis of knee   . Sleep apnea     with insomnia  . Morbid obesity ([place])   . Breast cancer ([place] Martin)   . Alzheimer's disease 05/17/07  . GERD (gastroesophageal reflux disease)   . Allergic rhinitis   . Dilated aortic root ([place] Martin)   . Diastolic dysfunction   . Mild aortic insufficiency     Past Surgical History  Procedure Laterality Date  . Ectopic pregnancy surgery    . Cesarean section    . Kidney stone surgery      REMOVAL  . Mastectomy    . Dilation and curettage of uterus    . Tracheostomy      Social History:  reports that she has never smoked. She has never used smokeless tobacco. She reports that she does not drink alcohol or use illicit drugs.  Family History:  Family History  Problem Relation Age of Onset  . Hypertension Mother   . Hypertension Sister   . Cancer Sister     Unknown cancer     Prior to Admission medications   Medication Sig Start Date End Date Taking? Authorizing Provider  aspirin 81 MG tablet Take 81 mg by mouth daily.   Yes Historical Provider, MD  ketoconazole (NIZORAL) 2 % cream  Apply 1 application topically daily.   Yes Historical Provider, MD  lisinopril-hydrochlorothiazide (PRINZIDE,ZESTORETIC) 20-12.5 MG per tablet Take 1 tablet by mouth daily.  01/30/13  Yes Historical Provider, MD  Multiple Vitamin (MULTIVITAMIN WITH MINERALS) TABS tablet Take 1 tablet by mouth daily.   Yes Historical Provider, MD  verapamil (COVERA HS) 180 MG (CO) 24 hr tablet Take 180 mg by mouth at bedtime.     Yes Historical Provider, MD    Physical Exam: Filed Vitals:   04/28/16 2056 04/28/16 2351 04/29/16 0133 04/29/16 0150  BP: 170/89 165/77 151/80 133/77  Pulse:  80 77 69 71  Temp: 98.9 F (37.2 C)  98 F (36.7 C) 98 F (36.7 C)  TempSrc: Oral  Oral Oral  Resp: 18 18 19 18   Height:    5\' 4"  (1.626 m)  Weight:    126.2 kg (278 lb 3.5 oz)  SpO2: 95% 95% 95% 97%   General: Not in acute distress HEENT:       Eyes: PERRL, EOMI, no scleral icterus.       ENT: No discharge from the ears and nose, no pharynx injection, no tonsillar enlargement.        Neck: No JVD, no bruit, no mass felt. Heme: No neck lymph node enlargement. Cardiac: S1/S2, RRR, No murmurs, No gallops or rubs. Pulm: No rales, wheezing, rhonchi or rubs. Abd: Soft, nondistended, nontender, no rebound pain, no organomegaly, BS present. GU: No hematuria Ext: No pitting leg edema bilaterally. 2+DP/PT pulse bilaterally. Musculoskeletal: No joint deformities, No joint redness or warmth, no limitation of ROM in spin. Skin: has erthamatous and purple rash in lower abdomen, groin and bilateral upper thighs. Has an open area of ulceration on her abdomen with prulent discharge and surrounding induration.  Neuro: Alert, oriented X3, cranial nerves II-XII grossly intact, moves all extremities normally.  Psych: Patient is not psychotic, no suicidal or hemocidal ideation.  Labs on Admission: I have personally reviewed following labs and imaging studies  CBC:  Recent Labs Lab 04/28/16 2337 04/28/16 2346 04/29/16 0209  WBC 5.7  --  6.3  NEUTROABS 2.6  --   --   HGB 13.1 13.6 13.3  HCT 39.8 40.0 38.6  MCV 91.9  --  89.8  PLT 247  --  [id number]   Basic Metabolic Panel:  Recent Labs Lab 04/28/16 2346 04/29/16 0209  NA 142 139  K 3.6 3.5  CL 104 106  CO2  --  26  GLUCOSE 86 95  BUN <3* <5*  CREATININE 0.50 0.50  CALCIUM  --  8.4*   GFR: Estimated Creatinine Clearance: 81.1 mL/min (by C-G formula based on Cr of 0.5). Liver Function Tests: No results for input(s): AST, ALT, ALKPHOS, BILITOT, PROT, ALBUMIN in the last 168 hours. No results for input(s): LIPASE, AMYLASE in the last 168  hours. No results for input(s): AMMONIA in the last 168 hours. Coagulation Profile: No results for input(s): INR, PROTIME in the last 168 hours. Cardiac Enzymes: No results for input(s): CKTOTAL, CKMB, CKMBINDEX, TROPONINI in the last 168 hours. BNP (last 3 results) No results for input(s): PROBNP in the last 8760 hours. HbA1C: No results for input(s): HGBA1C in the last 72 hours. CBG: No results for input(s): GLUCAP in the last 168 hours. Lipid Profile: No results for input(s): CHOL, HDL, LDLCALC, TRIG, CHOLHDL, LDLDIRECT in the last 72 hours. Thyroid Function Tests: No results for input(s): TSH, T4TOTAL, FREET4, T3FREE, THYROIDAB in the last 72 hours. Anemia Panel: No results  for input(s): VITAMINB12, FOLATE, FERRITIN, TIBC, IRON, RETICCTPCT in the last 72 hours. Urine analysis:    Component Value Date/Time   COLORURINE YELLOW 05/23/2014 [place] 05/23/2014 1548   LABSPEC 1.006 05/23/2014 1548   PHURINE 7.0 05/23/2014 1548   GLUCOSEU NEGATIVE 05/23/2014 1548   HGBUR NEGATIVE 05/23/2014 1548   BILIRUBINUR NEGATIVE 05/23/2014 1548   KETONESUR NEGATIVE 05/23/2014 1548   PROTEINUR NEGATIVE 05/23/2014 1548   UROBILINOGEN 0.2 05/23/2014 1548   NITRITE NEGATIVE 05/23/2014 1548   LEUKOCYTESUR TRACE* 05/23/2014 1548   Sepsis Labs: @LABRCNTIP (procalcitonin:4,lacticidven:4) )No results found for this or any previous visit (from the past 240 hour(s)).   Radiological Exams on Admission: No results found.   EKG: Not done in ED, will get one.   Assessment/Plan Principal Problem:   Cellulitis, abdominal wall Active Problems:   Hypertension   Sleep apnea   Dilated aortic root (HCC)   Chronic diastolic CHF (congestive heart failure) (HCC)   Tinea corporis   Cellulitis, abdominal wall: pt has cellulitis and an open area with infection in right lower abdominal wall. Pt is not septic, hemodynamically stable. -Placed on MedSurg bed for observation -start  doxycycline orally -consult to wound care  Tinea corporis: pt failed outpt topical cream treatment. -will start oral Terbinafine -continue topical ketoconazole  HTN: -Continue hydrochlorothiazide, lisinopril,verapamil -iV hydralazine when necessary  OSA: -CPAP  Chronic diastolic congestive heart failure: 2-D echo on 04/03/14 showed EF of 55%. patient does not have leg edema. CHF is compensated. -Continue aspirin -Check BNP -on HCTZ   DVT ppx:: SQ Lovenox Code Status: Full code Family Communication: yes, patient's daughter at bed side Disposition Plan:  Anticipate discharge back to previous home environment Consults called:  none Admission status: medical floor/obs  Date of Service 04/29/2016    Ivor Costa [facility] Pager [phone]  If 7PM-7AM, please contact night-coverage [email or web address] Password [facility] 04/29/2016, 5:47 AM

## 2016-04-29 NOTE — Progress Notes (Signed)
Patient declines the use of nocturnal CPAP at this time. Equipment placed at bedside. RT will continue to follow.

## 2016-04-29 NOTE — Discharge Summary (Signed)
Physician Discharge Summary  Carol Martin [id number] DOB: 23-Jul-1941 DOA: 04/28/2016  PCP: Philis Fendt, MD  Admit date: 04/28/2016 Discharge date: 04/29/2016  Time spent: > 35 minutes  Recommendations for Outpatient Follow-up:  1. Please decide whether or not to continue oral antifungal and antibiotic.   Discharge Diagnoses:  Principal Problem:   Cellulitis, abdominal wall Active Problems:   Hypertension   Sleep apnea   Dilated aortic root (HCC)   Chronic diastolic CHF (congestive heart failure) (HCC)   Tinea corporis   Discharge Condition: Stable  Diet recommendation: Heart healthy  Filed Weights   04/29/16 0150  Weight: 126.2 kg (278 lb 3.5 oz)    History of present illness:  75 y.o. female with medical history significant of hypertension, OSA on CPAP, obesity, breast cancer, Alzheimer's disease, dilated aorta or, aortic insufficiency, chronic diastolic congestive heart failure, who presents with rash.  Hospital Course:  Cellulitis, abdominal wall: pt has cellulitis and an open area with infection in right lower abdominal wall. Pt is not septic, hemodynamically stable. -Continue doxycycline orally   Tinea corporis: pt failed outpt topical cream treatment. -Continue Terbinafine -continue topical ketoconazole  HTN: -Continue hydrochlorothiazide, lisinopril,verapamil  OSA: -CPAP  Chronic diastolic congestive heart failure: 2-D echo on 04/03/14 showed EF of 55%  continue prior to admission home medication regimen, stable   Procedures:  None  Consultations:  None  Discharge Exam: Filed Vitals:   04/29/16 0615 04/29/16 1353  BP: 140/90 128/91  Pulse: 74 77  Temp: 98.1 F (36.7 C) 98.5 F (36.9 C)  Resp: 20 16    General: Pt is in nad, alert and awake Cardiovascular: no cyanosis Respiratory: no increased wob, no wheezes  Discharge Instructions   Discharge Instructions    Call MD for:  redness, tenderness, or signs of infection (pain,  swelling, redness, odor or green/yellow discharge around incision site)    Complete by:  As directed      Call MD for:  temperature >100.4    Complete by:  As directed      Diet - low sodium heart healthy    Complete by:  As directed      Discharge instructions    Complete by:  As directed   Please follow-up with your primary care physician in the next week. After discharge call their office to set up appointment.     Increase activity slowly    Complete by:  As directed           Current Discharge Medication List    START taking these medications   Details  doxycycline (VIBRA-TABS) 100 MG tablet Take 1 tablet (100 mg total) by mouth every 12 (twelve) hours. Qty: 12 tablet, Refills: 0    terbinafine (LAMISIL) 250 MG tablet Take 1 tablet (250 mg total) by mouth daily. Qty: 6 tablet, Refills: 0      CONTINUE these medications which have NOT CHANGED   Details  aspirin 81 MG tablet Take 81 mg by mouth daily.    ketoconazole (NIZORAL) 2 % cream Apply 1 application topically daily.    lisinopril-hydrochlorothiazide (PRINZIDE,ZESTORETIC) 20-12.5 MG per tablet Take 1 tablet by mouth daily.    Associated Diagnoses: Breast cancer, right ([place]); History of breast cancer    Multiple Vitamin (MULTIVITAMIN WITH MINERALS) TABS tablet Take 1 tablet by mouth daily.    verapamil (COVERA HS) 180 MG (CO) 24 hr tablet Take 180 mg by mouth at bedtime.         No  Known Allergies Follow-up Information    Follow up with [facility].   Contact information:   [address] [place] [address] [phone]        The results of significant diagnostics from this hospitalization (including imaging, microbiology, ancillary and laboratory) are listed below for reference.    Significant Diagnostic Studies: No results found.  Microbiology: No results found for this or any previous visit (from the past 240 hour(s)).   Labs: Basic Metabolic Panel:  Recent  Labs Lab 04/28/16 2346 04/29/16 0209  NA 142 139  K 3.6 3.5  CL 104 106  CO2  --  26  GLUCOSE 86 95  BUN <3* <5*  CREATININE 0.50 0.50  CALCIUM  --  8.4*   Liver Function Tests: No results for input(s): AST, ALT, ALKPHOS, BILITOT, PROT, ALBUMIN in the last 168 hours. No results for input(s): LIPASE, AMYLASE in the last 168 hours. No results for input(s): AMMONIA in the last 168 hours. CBC:  Recent Labs Lab 04/28/16 2337 04/28/16 2346 04/29/16 0209  WBC 5.7  --  6.3  NEUTROABS 2.6  --   --   HGB 13.1 13.6 13.3  HCT 39.8 40.0 38.6  MCV 91.9  --  89.8  PLT 247  --  237   Cardiac Enzymes: No results for input(s): CKTOTAL, CKMB, CKMBINDEX, TROPONINI in the last 168 hours. BNP: BNP (last 3 results)  Recent Labs  04/29/16 0209  BNP 31.3    ProBNP (last 3 results) No results for input(s): PROBNP in the last 8760 hours.  CBG: No results for input(s): GLUCAP in the last 168 hours.   Signed:  Velvet Bathe MD.  [facility] 04/29/2016, 4:13 PM

## 2016-04-29 NOTE — Progress Notes (Signed)
Patient given discharge instructions, and verbalized an understanding of all discharge instructions.  Patient agrees with discharge plan, and is being discharged in stable medical condition.  Patient given transportation via wheelchair. 

## 2016-04-29 NOTE — Care Management Note (Signed)
Case Management Note  Patient Details  Name: Carol Martin MRN: [id number] Date of Birth: 24-Nov-1940  Subjective/Objective:        75 yo admitted with cellulitis of abdominal wall.            Action/Plan: From home with daughter. PT recommendations reviewed with pt and daughter at bedside.  Choice offered for [facility] services and [facility] was chosen. [facility] rep contacted for referral. Pt states she has two walkers at home.  Pt and daughter presented with Advance Beneficiary Notice of Noncoverage. After information given about notice, pt and daughter decide they want her to discharge today. RN Lavella Lemons to page MD to discharge pt.    Expected Discharge Date:                  Expected Discharge Plan:  [place]  In-House Referral:     Discharge planning Services  CM Consult  Post Acute Care Choice:  Home Health Choice offered to:  Patient, Adult Children  DME Arranged:    DME Agency:     HH Arranged:  RN, PT [place] Agency:  [place]  Status of Service:  Completed, signed off  If discussed at [place] of Stay Meetings, dates discussed:    Additional CommentsLynnell Catalan, RN 04/29/2016, 2:35 PM  [phone]

## 2016-04-29 NOTE — ED Notes (Signed)
Attempted to call report to Room 1332, Scarlette Slice, RN will call back for report.

## 2016-04-29 NOTE — Progress Notes (Signed)
Patient seen and evaluated earlier this AM. Please refer to H&P for details regarding assessment and plan.  We'll plan on reassessing next a.m.  Carol Martin   Gen: Pt in nad, alert and awake CV: no cyanosis Pulm: no increased wob, no wheezes

## 2016-04-29 NOTE — Consult Note (Signed)
WOC wound consult note Reason for Consult:Cellulitis to right abdominal pannus.  Erythema to abdominal pannus present, groin is erythematous and tender.  Wound type:Infectious Pressure Ulcer POA: N/A Measurement: right abdominal pannus 2 cm x 2 cm wound bed is 75% slough, 25% pale pink Wound bed:75% slough, 25% pale pink Drainage (amount, consistency, odor) Minimal purulent drainage.  Musty odor to pannus   Periwound:Erythema  Dressing procedure/placement/frequency:Cleanse open wound to right abdominal pannus with NS and pat gently dry.  Apply Aquacel Ag, silver hydrofiber.  Cover with ABD pad and tape.  Change twice weekly.  Tuesday and Friday.   Interdry Ag to abdominal pannus and groin: Measure and cut length of InterDry Ag+ to fit in skin folds that have skin breakdown Tuck InterDry  Ag+ fabric into skin folds in a single layer, allow for 2 inches of overhang from skin edges to allow for wicking to occur May remove to bathe; dry area thoroughly and then tuck into affected areas again Do not apply any creams or ointments when using InterDry Ag+ DO NOT THROW AWAY FOR 5 DAYS unless soiled with stool DO NOT [facility] product, this will inactivate the silver in the material  New sheet of Interdry Ag+ should be applied after 5 days of use if patient continues to have skin breakdown   Will not follow at this time.  Please re-consult if needed.  Domenic Moras RN BSN [place] Pager [phone]

## 2016-06-19 NOTE — Progress Notes (Signed)
PT evaluation addendum Late entry for 04/29/16 PT diagnosis   04/29/16 1148  PT Assessment  PT Therapy Diagnosis  Difficulty walking;Generalized weakness (unsteadiness of feet)  PT Recommendation/Assessment Patient needs continued PT services  PT Problem List Decreased strength;Decreased activity tolerance;Decreased mobility;Pain;Decreased knowledge of precautions;Decreased safety awareness;Decreased knowledge of use of DME   06/19/2016 [place], [place]

## 2016-07-14 ENCOUNTER — Telehealth: Payer: Self-pay

## 2016-07-14 NOTE — Telephone Encounter (Signed)
rx sent to [facility]

## 2017-04-15 ENCOUNTER — Ambulatory Visit (HOSPITAL_COMMUNITY)
Admission: EM | Admit: 2017-04-15 | Discharge: 2017-04-15 | Disposition: A | Discharge status: Home / Self Care | Payer: Medicare Other | Attending: Family Medicine | Admitting: Family Medicine

## 2017-04-15 ENCOUNTER — Encounter (HOSPITAL_COMMUNITY): Payer: Self-pay | Admitting: Emergency Medicine

## 2017-04-15 ENCOUNTER — Ambulatory Visit (INDEPENDENT_AMBULATORY_CARE_PROVIDER_SITE_OTHER): Payer: Medicare Other

## 2017-04-15 DIAGNOSIS — S40012A Contusion of left shoulder, initial encounter: Secondary | ICD-10-CM | POA: Diagnosis not present

## 2017-04-15 DIAGNOSIS — M25512 Pain in left shoulder: Secondary | ICD-10-CM

## 2017-04-15 NOTE — Discharge Instructions (Signed)
Keep moving shoulder joint as tolerated.  Ice pack 10-15 minutes three times daily x 48 hrs followed by heating pad to left upper back. Tylenol/Motrin as needed for pain.

## 2017-04-15 NOTE — ED Triage Notes (Signed)
The patient presented to the [facility] with her family with a complaint of left shoulder pain secondary to a fall that occurred earlier today.

## 2017-04-15 NOTE — ED Provider Notes (Signed)
CSN: [id number]     Arrival date & time 04/15/17  1658 History   None    Chief Complaint  Patient presents with  . Shoulder Pain   (Consider location/radiation/quality/duration/timing/severity/associated sxs/prior Treatment) The history is provided by the patient.  Shoulder Pain  Location:  Clavicle and shoulder Clavicle location:  L clavicle Shoulder location:  L shoulder Injury: yes   Time since incident:  10 hours Mechanism of injury: fall   Fall:    Fall occurred:  Walking and tripped   Impact surface:  Hard floor   Point of impact:  Outstretched arms Pain details:    Quality:  Aching   Severity:  Moderate   Timing:  Constant  76 y/o female with H/O HTN presented to clinic with grand daughter with CC of left shoulder pain secondary to fall. Pt uses walker to walk and reports slipped on wet floor and landed on left shoulder. Denies head injury, LOC or any other physical complaints.  Shoulder joint, shoulder blade and clavicle tender to palpation. No visible swelling/bruising appreciated.  Decrease ROM in left arm. Unable to bring arm above head. Empty can test negative. Strength equal B/L hands.    Past Medical History:  Diagnosis Date  . Abnormal EKG   . Allergic rhinitis   . Alzheimer's disease 05/17/07  . Arthritis of knee   . Breast cancer ([place])   . Diastolic dysfunction   . Dilated aortic root ([place])   . GERD (gastroesophageal reflux disease)   . Hypertension   . Kidney stones   . Mild aortic insufficiency   . Morbid obesity ([place])   . Sleep apnea    with insomnia  . Venous (peripheral) insufficiency    Past Surgical History:  Procedure Laterality Date  . CESAREAN SECTION    . DILATION AND CURETTAGE OF UTERUS    . ECTOPIC PREGNANCY SURGERY    . KIDNEY STONE SURGERY     REMOVAL  . MASTECTOMY    . TRACHEOSTOMY     Family History  Problem Relation Age of Onset  . Hypertension Mother   . Hypertension Sister   . Cancer Sister        Unknown cancer   Social  History  Substance Use Topics  . Smoking status: Never Smoker  . Smokeless tobacco: Never Used  . Alcohol use No   OB History    No data available     Review of Systems  Constitutional: Negative.   HENT: Negative.   Musculoskeletal:       Left shoulder pain after fall  Neurological: Negative.   Psychiatric/Behavioral: Negative.     Allergies  Patient has no known allergies.  Home Medications   Prior to Admission medications   Medication Sig Start Date End Date Taking? Authorizing Provider  aspirin 81 MG tablet Take 81 mg by mouth daily.   Yes [provider]  lisinopril-hydrochlorothiazide (PRINZIDE,ZESTORETIC) 20-12.5 MG per tablet Take 1 tablet by mouth daily.  01/30/13  Yes [provider]   Meds Ordered and Administered this Visit  Medications - No data to display  BP (!) 154/95 (BP Location: Right Arm)   Pulse 94   Temp 99.5 F (37.5 C) (Oral)   Resp 20   SpO2 95%  No data found.   Physical Exam  Constitutional: She is oriented to person, place, and time. She appears well-developed and well-nourished. No distress.  HENT:  Head: Normocephalic.  Right Ear: External ear normal.  Left Ear: External ear  normal.  Nose: Nose normal.  Mouth/Throat: Oropharynx is clear and moist.  Eyes: Pupils are equal, round, and reactive to light.  Cardiovascular: Normal rate, regular rhythm and normal heart sounds.   Pulmonary/Chest: Effort normal and breath sounds normal.  Musculoskeletal: She exhibits tenderness (Pain to left shoulder joint, clavicle and shoulder blade. Tender to palpation with Decrease ROM. Negative empty can test. Strength equal B/L UE. Denies tingling or numbness in hands. No pain or tenderness to Humerus. No visibke swelling/bruising appreciate). She exhibits no edema or deformity.  Neurological: She is alert and oriented to person, place, and time.  Skin: Skin is warm. Capillary refill takes less than 2 seconds. No rash noted. No erythema.     Urgent Care Course     Procedures (including critical care time)  Labs Review Labs Reviewed - No data to display  Imaging Review Dg Shoulder Left  Result Date: 04/15/2017 CLINICAL DATA:  Fall, left shoulder pain EXAM: LEFT SHOULDER - 2+ VIEW COMPARISON:  None. FINDINGS: No fracture or dislocation is seen. Mild degenerative changes of the glenohumeral joint. Visualized soft tissues are within normal limits. Visualized left lung is clear. IMPRESSION: No fracture or dislocation is seen. Electronically Signed   By: Julian Hy M.D.   On: 04/15/2017 18:30     Visual Acuity Review  Right Eye Distance:   Left Eye Distance:   Bilateral Distance:    Right Eye Near:   Left Eye Near:    Bilateral Near:         MDM   1. Acute pain of left shoulder   2. Contusion of left shoulder, initial encounter    Keep moving shoulder joint as tolerated.  Ice pack 10-15 minutes three times daily x 48 hrs followed by heating pad to left upper back. Tylenol/Motrin as needed for pain.    Teola Bradley, NP 04/15/17 1901

## 2017-06-21 ENCOUNTER — Encounter (HOSPITAL_COMMUNITY): Payer: Self-pay | Admitting: Emergency Medicine

## 2017-06-21 ENCOUNTER — Emergency Department (HOSPITAL_COMMUNITY)
Admission: EM | Admit: 2017-06-21 | Discharge: 2017-06-21 | Disposition: A | Discharge status: Home / Self Care | Payer: Medicare Other | Attending: Emergency Medicine | Admitting: Emergency Medicine

## 2017-06-21 DIAGNOSIS — I5032 Chronic diastolic (congestive) heart failure: Secondary | ICD-10-CM | POA: Diagnosis not present

## 2017-06-21 DIAGNOSIS — L02414 Cutaneous abscess of left upper limb: Secondary | ICD-10-CM | POA: Insufficient documentation

## 2017-06-21 DIAGNOSIS — Z7982 Long term (current) use of aspirin: Secondary | ICD-10-CM | POA: Insufficient documentation

## 2017-06-21 DIAGNOSIS — F0390 Unspecified dementia without behavioral disturbance: Secondary | ICD-10-CM | POA: Insufficient documentation

## 2017-06-21 DIAGNOSIS — I110 Hypertensive heart disease with heart failure: Secondary | ICD-10-CM | POA: Insufficient documentation

## 2017-06-21 DIAGNOSIS — G309 Alzheimer's disease, unspecified: Secondary | ICD-10-CM | POA: Insufficient documentation

## 2017-06-21 DIAGNOSIS — L03114 Cellulitis of left upper limb: Secondary | ICD-10-CM | POA: Diagnosis present

## 2017-06-21 DIAGNOSIS — Z79899 Other long term (current) drug therapy: Secondary | ICD-10-CM | POA: Diagnosis not present

## 2017-06-21 LAB — COMPREHENSIVE METABOLIC PANEL
ALT: 9 U/L — AB (ref 14–54)
AST: 19 U/L (ref 15–41)
Albumin: 3.1 g/dL — ABNORMAL LOW (ref 3.5–5.0)
Alkaline Phosphatase: 78 U/L (ref 38–126)
Anion gap: 6 (ref 5–15)
BUN: 6 mg/dL (ref 6–20)
CHLORIDE: 107 mmol/L (ref 101–111)
CO2: 27 mmol/L (ref 22–32)
CREATININE: 0.59 mg/dL (ref 0.44–1.00)
Calcium: 8.6 mg/dL — ABNORMAL LOW (ref 8.9–10.3)
GFR calc Af Amer: >60 mL/min (ref >60)
GFR calc non Af Amer: >60 mL/min (ref >60)
Glucose, Bld: 113 mg/dL — ABNORMAL HIGH (ref 65–99)
POTASSIUM: 3.4 mmol/L — AB (ref 3.5–5.1)
Sodium: 140 mmol/L (ref 135–145)
Total Bilirubin: 1 mg/dL (ref 0.3–1.2)
Total Protein: 6.2 g/dL — ABNORMAL LOW (ref 6.5–8.1)

## 2017-06-21 LAB — CBC WITH DIFFERENTIAL/PLATELET
Basophils Absolute: 0 10*3/uL (ref 0.0–0.1)
Basophils Relative: 0 %
EOS ABS: 0.3 10*3/uL (ref 0.0–0.7)
EOS PCT: 6 %
HCT: 40.9 % (ref 36.0–46.0)
HEMOGLOBIN: 13.6 g/dL (ref 12.0–15.0)
LYMPHS ABS: 2.3 10*3/uL (ref 0.7–4.0)
LYMPHS PCT: 38 %
MCH: 31.3 pg (ref 26.0–34.0)
MCHC: 33.3 g/dL (ref 30.0–36.0)
MCV: 94 fL (ref 78.0–100.0)
MONOS PCT: 8 %
Monocytes Absolute: 0.5 10*3/uL (ref 0.1–1.0)
Neutro Abs: 2.9 10*3/uL (ref 1.7–7.7)
Neutrophils Relative %: 48 %
PLATELETS: 201 10*3/uL (ref 150–400)
RBC: 4.35 MIL/uL (ref 3.87–5.11)
RDW: 12.6 % (ref 11.5–15.5)
WBC: 6 10*3/uL (ref 4.0–10.5)

## 2017-06-21 LAB — I-STAT CG4 LACTIC ACID, ED: Lactic Acid, Venous: 1.1 mmol/L (ref 0.5–1.9)

## 2017-06-21 MED ORDER — CEFTRIAXONE SODIUM 1 G IJ SOLR
1.0000 g | Freq: Once | INTRAMUSCULAR | Status: AC
  Administered 2017-06-21: 1 g via INTRAMUSCULAR
  Filled 2017-06-21: qty 10

## 2017-06-21 MED ORDER — CEPHALEXIN 500 MG PO CAPS: 500.0000 mg | ORAL_CAPSULE | Freq: Four times a day (QID) | ORAL | 0 refills | Status: DC

## 2017-06-21 MED ORDER — STERILE WATER FOR INJECTION IJ SOLN
INTRAMUSCULAR | Status: AC
  Filled 2017-06-21: qty 10

## 2017-06-21 MED ORDER — SULFAMETHOXAZOLE-TRIMETHOPRIM 800-160 MG PO TABS: 1.0000 | ORAL_TABLET | Freq: Two times a day (BID) | ORAL | 0 refills | Status: AC

## 2017-06-21 NOTE — ED Notes (Signed)
Pt wound dressed with bacitracin and non-adherent dressing. Pt and family educated on dressing changes and given supplies.

## 2017-06-21 NOTE — Discharge Instructions (Signed)
Keflex and Bactrim as prescribed.  Local wound care with bacitracin and dressing changes twice daily.  Apply warm soaks as frequently as possible for the next several days.  Return to the emergency department if you develop fevers, redness that extends beyond the markings made today, or for other new and concerning symptoms.

## 2017-06-21 NOTE — ED Triage Notes (Signed)
Pt comes in after noticing an area on her arm that looks like it has gotten bit by something about 5 days ago. States it has become an open wound and is making her feel sick and dizzy.  Pt has erythema and drainage noted to that area.  Afebrile on exam.

## 2017-06-21 NOTE — ED Provider Notes (Signed)
[facility] DEPT Provider Note   CSN: [id number] Arrival date & time: 06/21/17  1513     History   Chief Complaint Chief Complaint  Patient presents with  . Cellulitis  . Arm Wound    HPI Carol Martin is a 76 y.o. female.  Patient is a 76 year old female with past medical history of dementia, hypertension presenting with complaints of redness, swelling, and drainage coming from the left forearm. She denies any specific injury or trauma. She denies any known insect bite. She denies any fevers or chills. Pain is worse with palpation and movement and relieved with rest.   The history is provided by the patient.    Past Medical History:  Diagnosis Date  . Abnormal EKG   . Allergic rhinitis   . Alzheimer's disease 05/17/07  . Arthritis of knee   . Breast cancer ([place])   . Diastolic dysfunction   . Dilated aortic root ([place])   . GERD (gastroesophageal reflux disease)   . Hypertension   . Kidney stones   . Mild aortic insufficiency   . Morbid obesity ([place])   . Sleep apnea    with insomnia  . Venous (peripheral) insufficiency     Patient Active Problem List   Diagnosis Date Noted  . Tinea corporis 04/29/2016  . Cellulitis, abdominal wall 04/29/2016  . Dilated aortic root ([place])   . Chronic diastolic CHF (congestive heart failure) ([place])   . Mild aortic insufficiency   . Sleep apnea   . Breast cancer ([place]) 02/08/2013  . Abnormal EKG 05/18/2011  . Dyspnea 05/18/2011  . Hypertension 05/18/2011  . Healthcare maintenance 05/18/2011  . Alzheimer's disease 05/17/2007    Past Surgical History:  Procedure Laterality Date  . CESAREAN SECTION    . DILATION AND CURETTAGE OF UTERUS    . ECTOPIC PREGNANCY SURGERY    . KIDNEY STONE SURGERY     REMOVAL  . MASTECTOMY    . TRACHEOSTOMY      OB History    No data available       Home Medications    Prior to Admission medications   Medication Sig Start Date End Date Taking? Authorizing Provider  aspirin 81 MG  tablet Take 81 mg by mouth daily.    [provider]  lisinopril-hydrochlorothiazide (PRINZIDE,ZESTORETIC) 20-12.5 MG per tablet Take 1 tablet by mouth daily.  01/30/13   [provider]    Family History Family History  Problem Relation Age of Onset  . Hypertension Mother   . Hypertension Sister   . Cancer Sister        Unknown cancer    Social History Social History  Substance Use Topics  . Smoking status: Never Smoker  . Smokeless tobacco: Never Used  . Alcohol use No     Allergies   Patient has no known allergies.   Review of Systems Review of Systems  All other systems reviewed and are negative.    Physical Exam Updated Vital Signs BP 138/90 (BP Location: Right Arm)   Pulse 90   Temp 98.8 F (37.1 C) (Oral)   Resp 20   Wt 126.1 kg (278 lb)   SpO2 99%   BMI 47.72 kg/m   Physical Exam  Constitutional: She is oriented to person, place, and time. She appears well-developed and well-nourished. No distress.  HENT:  Head: Normocephalic and atraumatic.  Neck: Normal range of motion. Neck supple.  Pulmonary/Chest: Effort normal.  Musculoskeletal: Normal range of motion.  The volar aspect  of the left forearm has an area of erythema with a central area of drainage. There is slight purulent material exuding from the center of the wound. She has full range of motion of the wrist with no significant discomfort.  Neurological: She is alert and oriented to person, place, and time.  Skin: Skin is warm and dry. She is not diaphoretic.  Nursing note and vitals reviewed.    ED Treatments / Results  Labs (all labs ordered are listed, but only abnormal results are displayed) Labs Reviewed  COMPREHENSIVE METABOLIC PANEL - Abnormal; Notable for the following:       Result Value   Potassium 3.4 (*)    Glucose, Bld 113 (*)    Calcium 8.6 (*)    Total Protein 6.2 (*)    Albumin 3.1 (*)    ALT 9 (*)    All other components within normal limits  CBC  WITH DIFFERENTIAL/PLATELET  URINALYSIS, ROUTINE W REFLEX MICROSCOPIC  I-STAT CG4 LACTIC ACID, ED    EKG  EKG Interpretation None       Radiology No results found.  Procedures Procedures (including critical care time)  Medications Ordered in ED Medications  cefTRIAXone (ROCEPHIN) injection 1 g (not administered)     Initial Impression / Assessment and Plan / ED Course  I have reviewed the triage vital signs and the nursing notes.  Pertinent labs & imaging results that were available during my care of the patient were reviewed by me and considered in my medical decision making (see chart for details).  This appears to be an abscess with cellulitis. It is already draining and I see no indication for I&D. I am unable to palpate any fluctuance that would be a target for this. She will be given IM Rocephin, then discharged with Keflex and Bactrim and local wound care.  Final Clinical Impressions(s) / ED Diagnoses   Final diagnoses:  None    New Prescriptions New Prescriptions   No medications on file     Veryl Speak, MD 06/21/17 1734

## 2017-06-21 NOTE — ED Notes (Signed)
Pt ambulated out of the ED with her walker. Pt was offered a wheelchair, but declined. Daughter accompanied patient out. A&Ox4.

## 2017-08-09 ENCOUNTER — Emergency Department (HOSPITAL_COMMUNITY): Payer: Medicare Other

## 2017-08-09 ENCOUNTER — Emergency Department (HOSPITAL_COMMUNITY)
Admission: EM | Admit: 2017-08-09 | Discharge: 2017-08-09 | Disposition: A | Discharge status: Home / Self Care | Payer: Medicare Other | Attending: Emergency Medicine | Admitting: Emergency Medicine

## 2017-08-09 ENCOUNTER — Encounter (HOSPITAL_COMMUNITY): Payer: Self-pay | Admitting: Emergency Medicine

## 2017-08-09 DIAGNOSIS — S52125A Nondisplaced fracture of head of left radius, initial encounter for closed fracture: Secondary | ICD-10-CM | POA: Diagnosis not present

## 2017-08-09 DIAGNOSIS — I10 Essential (primary) hypertension: Secondary | ICD-10-CM

## 2017-08-09 DIAGNOSIS — Y939 Activity, unspecified: Secondary | ICD-10-CM | POA: Diagnosis not present

## 2017-08-09 DIAGNOSIS — Z7982 Long term (current) use of aspirin: Secondary | ICD-10-CM | POA: Insufficient documentation

## 2017-08-09 DIAGNOSIS — G309 Alzheimer's disease, unspecified: Secondary | ICD-10-CM | POA: Insufficient documentation

## 2017-08-09 DIAGNOSIS — S63502A Unspecified sprain of left wrist, initial encounter: Secondary | ICD-10-CM | POA: Diagnosis not present

## 2017-08-09 DIAGNOSIS — Y929 Unspecified place or not applicable: Secondary | ICD-10-CM | POA: Diagnosis not present

## 2017-08-09 DIAGNOSIS — Z79899 Other long term (current) drug therapy: Secondary | ICD-10-CM | POA: Insufficient documentation

## 2017-08-09 DIAGNOSIS — W19XXXA Unspecified fall, initial encounter: Secondary | ICD-10-CM | POA: Insufficient documentation

## 2017-08-09 DIAGNOSIS — I110 Hypertensive heart disease with heart failure: Secondary | ICD-10-CM | POA: Diagnosis not present

## 2017-08-09 DIAGNOSIS — Z853 Personal history of malignant neoplasm of breast: Secondary | ICD-10-CM | POA: Diagnosis not present

## 2017-08-09 DIAGNOSIS — Y998 Other external cause status: Secondary | ICD-10-CM | POA: Insufficient documentation

## 2017-08-09 DIAGNOSIS — I509 Heart failure, unspecified: Secondary | ICD-10-CM | POA: Insufficient documentation

## 2017-08-09 DIAGNOSIS — S6992XA Unspecified injury of left wrist, hand and finger(s), initial encounter: Secondary | ICD-10-CM | POA: Diagnosis present

## 2017-08-09 MED ORDER — TRAMADOL HCL 50 MG PO TABS: 50.0000 mg | ORAL_TABLET | Freq: Four times a day (QID) | ORAL | 0 refills | Status: DC | PRN

## 2017-08-09 MED ORDER — HYDROCODONE-ACETAMINOPHEN 5-325 MG PO TABS
1.0000 | ORAL_TABLET | ORAL | Status: AC
Start: 2017-08-09 — End: 2017-08-09
  Administered 2017-08-09: 1 via ORAL
  Filled 2017-08-09: qty 1

## 2017-08-09 NOTE — ED Provider Notes (Signed)
[facility] DEPT Provider Note   CSN: [id number] Arrival date & time: 08/09/17  1040     History   Chief Complaint Chief Complaint  Patient presents with  . Fall  . Wrist Injury    HPI Carol Martin is a 76 y.o. female.  HPI Patient presents to the emergency room for evaluation of left wrist pain. Patient tripped and fell Thursday night onto her outstretched arm.. Since that time she's been having pain in her left wrist and also somewhat in her left elbow.  She denies any numbness or weakness. She denies any head injury. Past Medical History:  Diagnosis Date  . Abnormal EKG   . Allergic rhinitis   . Alzheimer's disease 05/17/07  . Arthritis of knee   . Breast cancer ([place])   . Diastolic dysfunction   . Dilated aortic root ([place])   . GERD (gastroesophageal reflux disease)   . Hypertension   . Kidney stones   . Mild aortic insufficiency   . Morbid obesity ([place])   . Sleep apnea    with insomnia  . Venous (peripheral) insufficiency     Patient Active Problem List   Diagnosis Date Noted  . Tinea corporis 04/29/2016  . Cellulitis, abdominal wall 04/29/2016  . Dilated aortic root ([place])   . Chronic diastolic CHF (congestive heart failure) ([place])   . Mild aortic insufficiency   . Sleep apnea   . Breast cancer ([place]) 02/08/2013  . Abnormal EKG 05/18/2011  . Dyspnea 05/18/2011  . Hypertension 05/18/2011  . Healthcare maintenance 05/18/2011  . Alzheimer's disease 05/17/2007    Past Surgical History:  Procedure Laterality Date  . CESAREAN SECTION    . DILATION AND CURETTAGE OF UTERUS    . ECTOPIC PREGNANCY SURGERY    . KIDNEY STONE SURGERY     REMOVAL  . MASTECTOMY    . TRACHEOSTOMY      OB History    No data available       Home Medications    Prior to Admission medications   Medication Sig Start Date End Date Taking? Authorizing Provider  aspirin 81 MG tablet Take 81 mg by mouth daily.    [provider]  cephALEXin (KEFLEX) 500 MG capsule  Take 1 capsule (500 mg total) by mouth 4 (four) times daily. 06/21/17   Veryl Speak, MD  lisinopril-hydrochlorothiazide (PRINZIDE,ZESTORETIC) 20-12.5 MG per tablet Take 1 tablet by mouth daily.  01/30/13   [provider]  traMADol (ULTRAM) 50 MG tablet Take 1 tablet (50 mg total) by mouth every 6 (six) hours as needed. 08/09/17   Dorie Rank, MD    Family History Family History  Problem Relation Age of Onset  . Hypertension Mother   . Hypertension Sister   . Cancer Sister        Unknown cancer    Social History Social History  Substance Use Topics  . Smoking status: Never Smoker  . Smokeless tobacco: Never Used  . Alcohol use No     Allergies   Patient has no known allergies.   Review of Systems Review of Systems  All other systems reviewed and are negative.    Physical Exam Updated Vital Signs BP (!) 182/94 (BP Location: Left Arm)   Pulse 82   Temp 98.4 F (36.9 C) (Oral)   Resp 18   SpO2 98%   Physical Exam  Constitutional: She appears well-developed and well-nourished. No distress.  HENT:  Head: Normocephalic and atraumatic.  Right Ear: External ear  normal.  Left Ear: External ear normal.  Eyes: Conjunctivae are normal. Right eye exhibits no discharge. Left eye exhibits no discharge. No scleral icterus.  Neck: Neck supple. No tracheal deviation present.  Cardiovascular: Normal rate.   Pulmonary/Chest: Effort normal. No stridor. No respiratory distress.  Abdominal: She exhibits no distension.  Musculoskeletal: She exhibits no edema.       Right shoulder: She exhibits no tenderness, no bony tenderness and no swelling.       Left shoulder: She exhibits no tenderness, no bony tenderness and no swelling.       Left elbow: She exhibits no swelling, no effusion and no deformity. Tenderness found. Radial head tenderness noted.       Right wrist: She exhibits no tenderness, no bony tenderness and no swelling.       Left wrist: She exhibits decreased range  of motion, tenderness and swelling. She exhibits no bony tenderness, no effusion, no crepitus and no deformity.       Right hip: She exhibits normal range of motion, no tenderness, no bony tenderness and no swelling.       Left hip: She exhibits normal range of motion, no tenderness and no bony tenderness.       Right ankle: She exhibits no swelling. No tenderness.       Left ankle: She exhibits no swelling. No tenderness.       Cervical back: She exhibits no tenderness, no bony tenderness and no swelling.       Thoracic back: She exhibits no tenderness, no bony tenderness and no swelling.       Lumbar back: She exhibits no tenderness, no bony tenderness and no swelling.       Left hand: Normal.  Neurological: She is alert. Cranial nerve deficit: no gross deficits.  Skin: Skin is warm and dry. No rash noted.  Psychiatric: She has a normal mood and affect.  Nursing note and vitals reviewed.    ED Treatments / Results    Radiology Dg Elbow Complete Left  Result Date: 08/09/2017 CLINICAL DATA:  [place] 4 days ago.  Pain since then. EXAM: LEFT ELBOW - COMPLETE 3+ VIEW COMPARISON:  None. FINDINGS: Fracture the lateral aspect of the radial head. Small joint effusion. Humerus an ulna appear negative. IMPRESSION: Small lateral radial head fracture. Electronically Signed   By: Nelson Chimes M.D.   On: 08/09/2017 14:17   Dg Wrist Complete Left  Result Date: 08/09/2017 CLINICAL DATA:  Left wrist pain status post fall. EXAM: LEFT WRIST - COMPLETE 3+ VIEW COMPARISON:  None. FINDINGS: Generalized osteopenia.  No acute fracture or dislocation. Severe osteoarthritis of the radiolunate joint. Severe osteoarthritis of the first [facility] joint. Mild osteoarthritis of the distal radioulnar joint. Soft tissues are normal. IMPRESSION: No acute osseous injury of the left wrist. Electronically Signed   By: Kathreen Devoid   On: 08/09/2017 12:14    Procedures Procedures (including critical care time)  Medications Ordered  in ED Medications  HYDROcodone-acetaminophen (NORCO/VICODIN) 5-325 MG per tablet 1 tablet (1 tablet Oral Given 08/09/17 1423)     Initial Impression / Assessment and Plan / ED Course  I have reviewed the triage vital signs and the nursing notes.  Pertinent labs & imaging results that were available during my care of the patient were reviewed by me and considered in my medical decision making (see chart for details).  Non displaced radial head fx.   Elbow xray normal.   Splint and sling by ortho  tech.  Outpatient follow up with ortho  Final Clinical Impressions(s) / ED Diagnoses   Final diagnoses:  Closed nondisplaced fracture of head of left radius, initial encounter  Sprain of left wrist, initial encounter  Hypertension, unspecified type    New Prescriptions Discharge Medication List as of 08/09/2017  3:01 PM    START taking these medications   Details  traMADol (ULTRAM) 50 MG tablet Take 1 tablet (50 mg total) by mouth every 6 (six) hours as needed., Starting Sun 08/09/2017, Print         Dorie Rank, MD 08/09/17 1615

## 2017-08-09 NOTE — ED Notes (Signed)
Patient transported to X-ray 

## 2017-08-09 NOTE — ED Notes (Signed)
RN verified Pts name, DOB and allergy list PTA

## 2017-08-09 NOTE — Discharge Instructions (Signed)
Wear the sling and splint, you may remove them to bathe, follow up with an orthopedic doctor, call to schedule a follow up appointment

## 2017-08-09 NOTE — ED Triage Notes (Signed)
Pt reports she tripped and fell Thursday night. Pt complains of L wrist pain since then. Swelling noted to L wrist. No deformity noted.

## 2017-09-04 ENCOUNTER — Encounter (HOSPITAL_COMMUNITY): Payer: Self-pay | Admitting: Emergency Medicine

## 2017-09-04 ENCOUNTER — Emergency Department (HOSPITAL_COMMUNITY)
Admission: EM | Admit: 2017-09-04 | Discharge: 2017-09-04 | Disposition: A | Discharge status: Home / Self Care | Payer: Medicare Other | Attending: Emergency Medicine | Admitting: Emergency Medicine

## 2017-09-04 ENCOUNTER — Emergency Department (HOSPITAL_COMMUNITY): Payer: Medicare Other

## 2017-09-04 ENCOUNTER — Emergency Department (HOSPITAL_BASED_OUTPATIENT_CLINIC_OR_DEPARTMENT_OTHER)
Admission: EM | Admit: 2017-09-04 | Discharge: 2017-09-04 | Disposition: A | Discharge status: Home / Self Care | Payer: Medicare Other | Source: Home / Self Care

## 2017-09-04 DIAGNOSIS — Z853 Personal history of malignant neoplasm of breast: Secondary | ICD-10-CM | POA: Insufficient documentation

## 2017-09-04 DIAGNOSIS — M7989 Other specified soft tissue disorders: Secondary | ICD-10-CM | POA: Insufficient documentation

## 2017-09-04 DIAGNOSIS — G309 Alzheimer's disease, unspecified: Secondary | ICD-10-CM | POA: Insufficient documentation

## 2017-09-04 DIAGNOSIS — Z79899 Other long term (current) drug therapy: Secondary | ICD-10-CM | POA: Diagnosis not present

## 2017-09-04 DIAGNOSIS — Z7982 Long term (current) use of aspirin: Secondary | ICD-10-CM | POA: Insufficient documentation

## 2017-09-04 DIAGNOSIS — I110 Hypertensive heart disease with heart failure: Secondary | ICD-10-CM | POA: Diagnosis not present

## 2017-09-04 DIAGNOSIS — M79642 Pain in left hand: Secondary | ICD-10-CM | POA: Diagnosis not present

## 2017-09-04 DIAGNOSIS — I5032 Chronic diastolic (congestive) heart failure: Secondary | ICD-10-CM | POA: Insufficient documentation

## 2017-09-04 DIAGNOSIS — I10 Essential (primary) hypertension: Secondary | ICD-10-CM | POA: Diagnosis not present

## 2017-09-04 NOTE — Discharge Instructions (Signed)
Your hand ultrasound is negative for blood clot. No evidence of fracture. Your swelling is most likely due to a recent elbow fracture. It will improve if elevated. Keep in sling as needed. ACE wrap to help with swelling. Follow up with orthopedics.

## 2017-09-04 NOTE — ED Triage Notes (Addendum)
Patient has fractured left arm couple weeks ago and had swelling in left hand but swelling still persist and having lots of pain over the past 3 days. Patient was given Ibuprofen 600mg  about 3 hours ago for pain.

## 2017-09-04 NOTE — ED Provider Notes (Signed)
[facility] DEPT Provider Note   CSN: [id number] Arrival date & time: 09/04/17  1314     History   Chief Complaint Chief Complaint  Patient presents with  . left hand swelling    HPI Carol Martin is a 76 y.o. female.  HPI Carol Martin is a 76 y.o. female presents to emergency department complaining of pain to the left wrist.  Patient states she fell approximately 3 weeks ago.  She sustained a nondisplaced fracture to the radial head.  X-ray of the wrist was negative at that time.  Patient followed up with orthopedics who cleared her.  Daughter states initially she did have significant swelling to the left wrist and hand which has gone down.  She did wear a splint on her wrist for a few weeks.  Patient has began complaining of increased pain in her wrist about 3 days ago.  Today the pain was not controlled with ibuprofen which is why they came to the ED.  Patient denies any bruising or discoloration to the wrist or hand.  No numbness to the hand.  Patient sleeps on her left arm and does not keep it elevated.  No new injuries.  Past Medical History:  Diagnosis Date  . Abnormal EKG   . Allergic rhinitis   . Alzheimer's disease 05/17/07  . Arthritis of knee   . Breast cancer ([place])   . Diastolic dysfunction   . Dilated aortic root ([place])   . GERD (gastroesophageal reflux disease)   . Hypertension   . Kidney stones   . Mild aortic insufficiency   . Morbid obesity ([place])   . Sleep apnea    with insomnia  . Venous (peripheral) insufficiency     Patient Active Problem List   Diagnosis Date Noted  . Tinea corporis 04/29/2016  . Cellulitis, abdominal wall 04/29/2016  . Dilated aortic root ([place])   . Chronic diastolic CHF (congestive heart failure) ([place])   . Mild aortic insufficiency   . Sleep apnea   . Breast cancer ([place]) 02/08/2013  . Abnormal EKG 05/18/2011  . Dyspnea 05/18/2011  . Hypertension 05/18/2011  . Healthcare maintenance 05/18/2011   . Alzheimer's disease 05/17/2007    Past Surgical History:  Procedure Laterality Date  . CESAREAN SECTION    . DILATION AND CURETTAGE OF UTERUS    . ECTOPIC PREGNANCY SURGERY    . KIDNEY STONE SURGERY     REMOVAL  . MASTECTOMY    . TRACHEOSTOMY      OB History    No data available       Home Medications    Prior to Admission medications   Medication Sig Start Date End Date Taking? Authorizing Provider  aspirin 81 MG tablet Take 81 mg by mouth daily.   Yes [provider]  ibuprofen (ADVIL,MOTRIN) 800 MG tablet Take 800 mg by mouth every 8 (eight) hours as needed for moderate pain.   Yes [provider]  verapamil (CALAN-SR) 180 MG CR tablet Take 180 mg by mouth daily.  08/10/17  Yes [provider]  cephALEXin (KEFLEX) 500 MG capsule Take 1 capsule (500 mg total) by mouth 4 (four) times daily. Patient not taking: Reported on 09/04/2017 06/21/17   Veryl Speak, MD  traMADol (ULTRAM) 50 MG tablet Take 1 tablet (50 mg total) by mouth every 6 (six) hours as needed. Patient not taking: Reported on 09/04/2017 08/09/17   Dorie Rank, MD    Family History Family History  Problem Relation Age of Onset  . Hypertension Mother   . Hypertension Sister   . Cancer Sister        Unknown cancer    Social History Social History  Substance Use Topics  . Smoking status: Never Smoker  . Smokeless tobacco: Never Used  . Alcohol use No     Allergies   Patient has no known allergies.   Review of Systems Review of Systems  Constitutional: Negative for chills and fever.  Musculoskeletal: Positive for arthralgias, joint swelling and myalgias.  Neurological: Negative for weakness and numbness.  All other systems reviewed and are negative.    Physical Exam Updated Vital Signs BP (!) 169/109 (BP Location: Right Arm)   Pulse 94   Temp 97.8 F (36.6 C) (Oral)   Resp 19   SpO2 95%   Physical Exam  Constitutional: She is oriented to person, place, and  time. She appears well-developed and well-nourished. No distress.  Musculoskeletal:  Normal-appearing left elbow with full range of motion of the elbow.  Swelling noted to the left forearm, left wrist, left hand.  Tenderness to palpation over anterior wrist, and dorsal lateral hand.  Pain with range of motion of the wrist joint.  Neurological: She is alert and oriented to person, place, and time.     ED Treatments / Results  Labs (all labs ordered are listed, but only abnormal results are displayed) Labs Reviewed - No data to display  EKG  EKG Interpretation None       Radiology Dg Hand Complete Left  Result Date: 09/04/2017 CLINICAL DATA:  Pain and swelling following fall EXAM: LEFT HAND - COMPLETE 3+ VIEW COMPARISON:  Left wrist August 09, 2017 FINDINGS: Frontal, oblique, and lateral views were obtained. There is soft tissue swelling, primarily dorsally. There is no fracture or dislocation evident. There is moderate osteoarthritic change in the radiocarpal and ulnocarpal joint spaces. There is osteoarthritic change in the first carpal -metacarpal joint. Other joint spaces appear unremarkable. No erosive change. A small focus of calcification is noted in the first IP joint, felt to be of arthropathic etiology. IMPRESSION: Soft tissue swelling dorsally. No fracture or dislocation. Areas of osteoarthritic change, primarily in the wrist region. Electronically Signed   By: Lowella Grip III M.D.   On: 09/04/2017 15:29    Procedures Procedures (including critical care time)  Medications Ordered in ED Medications - No data to display   Initial Impression / Assessment and Plan / ED Course  I have reviewed the triage vital signs and the nursing notes.  Pertinent labs & imaging results that were available during my care of the patient were reviewed by me and considered in my medical decision making (see chart for details).     Patient with worsening left wrist pain, fall 3 weeks  ago.  Patient has good radial pulses in that wrist.  Good capillary refill distally.  Full range motion of all fingers and wrist.  Hand x-ray negative today.  X-ray of the wrist -3 weeks ago.  I reviewed x-rays, no obvious fractures.  Will get venous duplex to rule out DVT.  Venous duplex is negative.  Most likely dependent edema from elbow fracture.  Instructed to keep Arm elevated.  Will provide with ACE wrap, sling. Home with orthopedics follow up.   Vitals:   09/04/17 1352 09/04/17 1833  BP: (!) 169/109 (!) 153/90  Pulse: 94 86  Resp: 19 20  Temp: 97.8 F (36.6 C)   TempSrc: Oral  SpO2: 95% 99%     Final Clinical Impressions(s) / ED Diagnoses   Final diagnoses:  Swelling of left hand    New Prescriptions New Prescriptions   No medications on file     Jeannett Senior, Hershal Coria 09/04/17 2026    Deno Etienne, DO 09/04/17 2035

## 2017-09-04 NOTE — Progress Notes (Signed)
LUE venous duplex: no DVT or SVT.

## 2018-02-11 ENCOUNTER — Ambulatory Visit (INDEPENDENT_AMBULATORY_CARE_PROVIDER_SITE_OTHER): Payer: Self-pay

## 2018-02-11 ENCOUNTER — Ambulatory Visit (INDEPENDENT_AMBULATORY_CARE_PROVIDER_SITE_OTHER): Payer: Medicare Other | Admitting: Orthopaedic Surgery

## 2018-02-11 ENCOUNTER — Encounter (INDEPENDENT_AMBULATORY_CARE_PROVIDER_SITE_OTHER): Payer: Self-pay | Admitting: Orthopaedic Surgery

## 2018-02-11 ENCOUNTER — Ambulatory Visit (INDEPENDENT_AMBULATORY_CARE_PROVIDER_SITE_OTHER): Payer: Medicare Other

## 2018-02-11 DIAGNOSIS — M1711 Unilateral primary osteoarthritis, right knee: Secondary | ICD-10-CM

## 2018-02-11 DIAGNOSIS — M25562 Pain in left knee: Secondary | ICD-10-CM

## 2018-02-11 DIAGNOSIS — M25551 Pain in right hip: Secondary | ICD-10-CM | POA: Insufficient documentation

## 2018-02-11 DIAGNOSIS — M25561 Pain in right knee: Secondary | ICD-10-CM

## 2018-02-11 DIAGNOSIS — M1712 Unilateral primary osteoarthritis, left knee: Secondary | ICD-10-CM

## 2018-02-11 DIAGNOSIS — G8929 Other chronic pain: Secondary | ICD-10-CM

## 2018-02-11 DIAGNOSIS — M25552 Pain in left hip: Secondary | ICD-10-CM | POA: Diagnosis not present

## 2018-02-11 NOTE — Progress Notes (Signed)
Office Visit Note   Patient: Carol Martin           Date of Birth: 1941/01/10           MRN: [id number] Visit Date: 02/11/2018              Requested by: Nolene Ebbs, MD [address] [place], [place] [address] PCP: Nolene Ebbs, MD   Assessment & Plan: Visit Diagnoses:  1. Bilateral hip pain   2. Chronic pain of both knees   3. Unilateral primary osteoarthritis, left knee   4. Unilateral primary osteoarthritis, right knee     Plan: Unfortunately due to the severity of her obesity she is not a surgical candidate at all for knee replacement surgery.  I explained this to her in detail and showed her her x-rays.  She does wish to try conservative treatment with steroid injections in her knees.  I explained the risk and benefits of these types of injections.  I then placed in both knees and she tolerated them well.  She knows to wait at least 3-4 months between steroid injections.  I have counseled about weight loss as well.  Follow-Up Instructions: Return if symptoms worsen or fail to improve.   Orders:  Orders Placed This Encounter  Procedures  . Large Joint Inj  . XR KNEE 3 VIEW RIGHT  . XR KNEE 3 VIEW LEFT   No orders of the defined types were placed in this encounter.     Procedures: Large Joint Inj: bilateral knee on 02/11/2018 4:22 PM Indications: diagnostic evaluation and pain Details: 22 G 1.5 in needle, superolateral approach  Arthrogram: No  Outcome: tolerated well, no immediate complications Procedure, treatment alternatives, risks and benefits explained, specific risks discussed. Consent was given by the patient. Immediately prior to procedure a time out was called to verify the correct patient, procedure, equipment, support staff and site/side marked as required. Patient was prepped and draped in the usual sterile fashion.       Clinical Data: No additional findings.   Subjective: Chief Complaint  Patient presents with  . Right Knee - Pain    . Left Knee - Pain   The patient comes in today with debilitating bilateral knee pain.  She is a morbidly obese individual who is 77 years old.  Her body mass index is 48.  She has had injections in the past and wants to consider those again today.  She endplates with a rolling walker and has a really hard time getting around.  She is not a diabetic.  She currently denies any headache, chest pain, shortness of breath, fever, chills, nausea, vomiting. HPI  Review of Systems She currently denies any systemic illnesses.  Objective: Vital Signs: There were no vitals taken for this visit.  Physical Exam She is alert and oriented x3 and in no acute distress Ortho Exam Examination of both her knees show global tenderness.  Both knees have severe patellofemoral crepitation and varus malalignment with limited range of motion.  Both knees have an incredibly large soft tissue envelope around them consistent with severe morbid obesity. Specialty Comments:  No specialty comments available.  Imaging: Xr Knee 3 View Left  Result Date: 02/11/2018 2 views of the left knee show severe end-stage arthritis with abundant soft tissue.  Xr Knee 3 View Right  Result Date: 02/11/2018 2 views of the right knee shows severe end-stage arthritis with abundant adipose tissue.    PMFS History: Patient Active Problem List  Diagnosis Date Noted  . Chronic pain of both knees 02/11/2018  . Bilateral hip pain 02/11/2018  . Unilateral primary osteoarthritis, left knee 02/11/2018  . Unilateral primary osteoarthritis, right knee 02/11/2018  . Tinea corporis 04/29/2016  . Cellulitis, abdominal wall 04/29/2016  . Dilated aortic root ([place])   . Chronic diastolic CHF (congestive heart failure) ([place])   . Mild aortic insufficiency   . Sleep apnea   . Breast cancer ([place]) 02/08/2013  . Abnormal EKG 05/18/2011  . Dyspnea 05/18/2011  . Hypertension 05/18/2011  . Healthcare maintenance 05/18/2011  . Alzheimer's  disease 05/17/2007   Past Medical History:  Diagnosis Date  . Abnormal EKG   . Allergic rhinitis   . Alzheimer's disease 05/17/07  . Arthritis of knee   . Breast cancer ([place])   . Diastolic dysfunction   . Dilated aortic root ([place])   . GERD (gastroesophageal reflux disease)   . Hypertension   . Kidney stones   . Mild aortic insufficiency   . Morbid obesity ([place])   . Sleep apnea    with insomnia  . Venous (peripheral) insufficiency     Family History  Problem Relation Age of Onset  . Hypertension Mother   . Hypertension Sister   . Cancer Sister        Unknown cancer    Past Surgical History:  Procedure Laterality Date  . CESAREAN SECTION    . DILATION AND CURETTAGE OF UTERUS    . ECTOPIC PREGNANCY SURGERY    . KIDNEY STONE SURGERY     REMOVAL  . MASTECTOMY    . TRACHEOSTOMY     Social History   Occupational History  . Occupation: Retired   Tobacco Use  . Smoking status: Never Smoker  . Smokeless tobacco: Never Used  Substance and Sexual Activity  . Alcohol use: No  . Drug use: No  . Sexual activity: Never    Birth control/protection: Post-menopausal

## 2018-05-05 ENCOUNTER — Other Ambulatory Visit: Payer: Self-pay

## 2018-05-05 ENCOUNTER — Emergency Department (HOSPITAL_COMMUNITY)
Admission: EM | Admit: 2018-05-05 | Discharge: 2018-05-05 | Disposition: A | Discharge status: Home / Self Care | Payer: Medicare Other | Attending: Emergency Medicine | Admitting: Emergency Medicine

## 2018-05-05 ENCOUNTER — Emergency Department (HOSPITAL_COMMUNITY): Payer: Medicare Other

## 2018-05-05 ENCOUNTER — Encounter (HOSPITAL_COMMUNITY): Payer: Self-pay

## 2018-05-05 ENCOUNTER — Emergency Department (HOSPITAL_BASED_OUTPATIENT_CLINIC_OR_DEPARTMENT_OTHER)
Admit: 2018-05-05 | Discharge: 2018-05-05 | Disposition: A | Discharge status: Home / Self Care | Payer: Medicare Other | Attending: Emergency Medicine | Admitting: Emergency Medicine

## 2018-05-05 DIAGNOSIS — Z7982 Long term (current) use of aspirin: Secondary | ICD-10-CM | POA: Insufficient documentation

## 2018-05-05 DIAGNOSIS — Z79899 Other long term (current) drug therapy: Secondary | ICD-10-CM | POA: Insufficient documentation

## 2018-05-05 DIAGNOSIS — Z853 Personal history of malignant neoplasm of breast: Secondary | ICD-10-CM | POA: Diagnosis not present

## 2018-05-05 DIAGNOSIS — I5032 Chronic diastolic (congestive) heart failure: Secondary | ICD-10-CM | POA: Diagnosis not present

## 2018-05-05 DIAGNOSIS — L03116 Cellulitis of left lower limb: Secondary | ICD-10-CM | POA: Diagnosis not present

## 2018-05-05 DIAGNOSIS — M7989 Other specified soft tissue disorders: Secondary | ICD-10-CM

## 2018-05-05 DIAGNOSIS — R2242 Localized swelling, mass and lump, left lower limb: Secondary | ICD-10-CM | POA: Diagnosis present

## 2018-05-05 DIAGNOSIS — I110 Hypertensive heart disease with heart failure: Secondary | ICD-10-CM | POA: Diagnosis not present

## 2018-05-05 DIAGNOSIS — G309 Alzheimer's disease, unspecified: Secondary | ICD-10-CM | POA: Diagnosis not present

## 2018-05-05 LAB — I-STAT CG4 LACTIC ACID, ED: Lactic Acid, Venous: 2.12 mmol/L (ref 0.5–1.9)

## 2018-05-05 LAB — CBC
HEMATOCRIT: 40.7 % (ref 36.0–46.0)
HEMOGLOBIN: 13.6 g/dL (ref 12.0–15.0)
MCH: 32.2 pg (ref 26.0–34.0)
MCHC: 33.4 g/dL (ref 30.0–36.0)
MCV: 96.4 fL (ref 78.0–100.0)
Platelets: 190 10*3/uL (ref 150–400)
RBC: 4.22 MIL/uL (ref 3.87–5.11)
RDW: 13 % (ref 11.5–15.5)
WBC: 6 10*3/uL (ref 4.0–10.5)

## 2018-05-05 LAB — DIFFERENTIAL
Basophils Absolute: 0 10*3/uL (ref 0.0–0.1)
Basophils Relative: 1 %
EOS PCT: 2 %
Eosinophils Absolute: 0.1 10*3/uL (ref 0.0–0.7)
LYMPHS ABS: 1.6 10*3/uL (ref 0.7–4.0)
LYMPHS PCT: 27 %
Monocytes Absolute: 0.9 10*3/uL (ref 0.1–1.0)
Monocytes Relative: 14 %
NEUTROS PCT: 56 %
Neutro Abs: 3.4 10*3/uL (ref 1.7–7.7)

## 2018-05-05 LAB — COMPREHENSIVE METABOLIC PANEL
ALBUMIN: 3 g/dL — AB (ref 3.5–5.0)
ALT: 12 U/L (ref 0–44)
ANION GAP: 5 (ref 5–15)
AST: 18 U/L (ref 15–41)
Alkaline Phosphatase: 81 U/L (ref 38–126)
BUN: 9 mg/dL (ref 8–23)
CO2: 31 mmol/L (ref 22–32)
Calcium: 8.6 mg/dL — ABNORMAL LOW (ref 8.9–10.3)
Chloride: 107 mmol/L (ref 98–111)
Creatinine, Ser: 0.66 mg/dL (ref 0.44–1.00)
GFR calc Af Amer: >60 mL/min (ref >60)
GFR calc non Af Amer: >60 mL/min (ref >60)
GLUCOSE: 136 mg/dL — AB (ref 70–99)
Potassium: 3.3 mmol/L — ABNORMAL LOW (ref 3.5–5.1)
SODIUM: 143 mmol/L (ref 135–145)
Total Bilirubin: 1.3 mg/dL — ABNORMAL HIGH (ref 0.3–1.2)
Total Protein: 7.1 g/dL (ref 6.5–8.1)

## 2018-05-05 LAB — BRAIN NATRIURETIC PEPTIDE: B Natriuretic Peptide: 78 pg/mL (ref 0.0–100.0)

## 2018-05-05 MED ORDER — CEPHALEXIN 500 MG PO CAPS: 1000.0000 mg | ORAL_CAPSULE | Freq: Three times a day (TID) | ORAL | 0 refills | Status: DC

## 2018-05-05 MED ORDER — VANCOMYCIN HCL IN DEXTROSE 1-5 GM/200ML-% IV SOLN
1000.0000 mg | Freq: Once | INTRAVENOUS | Status: AC
  Administered 2018-05-05: 1000 mg via INTRAVENOUS
  Filled 2018-05-05: qty 200

## 2018-05-05 MED ORDER — SULFAMETHOXAZOLE-TRIMETHOPRIM 800-160 MG PO TABS
1.0000 | ORAL_TABLET | Freq: Two times a day (BID) | ORAL | 0 refills | Status: AC
Start: 2018-05-05 — End: 2018-05-12

## 2018-05-05 MED ORDER — PIPERACILLIN-TAZOBACTAM 3.375 G IVPB 30 MIN
3.3750 g | Freq: Once | INTRAVENOUS | Status: AC
  Administered 2018-05-05: 3.375 g via INTRAVENOUS
  Filled 2018-05-05: qty 50

## 2018-05-05 NOTE — Progress Notes (Signed)
A consult was received from an ED physician for Vancomycin & Zosyn per pharmacy dosing.  The patient's profile has been reviewed for ht/wt/allergies/indication/available labs.   A one time order has been placed for Vancomycin 1gm & Zosyn 3.375gm.  Further antibiotics/pharmacy consults should be ordered by admitting physician if indicated.                       Thank you,  Minda Ditto 05/05/2018  3:48 PM

## 2018-05-05 NOTE — ED Notes (Signed)
No bolus was administered-MD orders due to swelling in legs.

## 2018-05-05 NOTE — Progress Notes (Signed)
Left lower extremity venous duplex has been completed. Negative for obvious evidence of DVT. Results were given to Dr. Darl Householder.  05/05/18 4:08 PM [facility] RVT

## 2018-05-05 NOTE — ED Triage Notes (Signed)
Patient c/o left foot and leg swelling with redness x 3 days.

## 2018-05-05 NOTE — Discharge Instructions (Signed)
Take keflex three times daily for a week and bactrim twice daily for a week for skin infection   Keep leg elevated.  See your doctor in a week   Return to ER if you have fever, severe pain, worse leg swelling and redness.

## 2018-05-05 NOTE — ED Provider Notes (Signed)
[facility] DEPT Provider Note   CSN: [id number] Arrival date & time: 05/05/18  1445     History   Chief Complaint Chief Complaint  Patient presents with  . Foot Swelling  . Leg Swelling    HPI Carol Martin is a 77 y.o. female hx of GERD, HTN, obesity, previous DVT not on anticoagulation, here presenting with left leg pain, redness.  Patient states that she noticed left leg swelling and redness for the last 3 days.  Patient states that progressively got worse.  Denies any trauma or injury to the left foot.  Denies any fevers or chills.  Patient states that she has chronic leg swelling and is taking lisinopril for it.  Patient has some subjective shortness of breath but denies chest pain.  Patient states that she had previous left leg DVT but is not currently on any blood thinners.  Patient denies any recent travels.  The history is provided by the patient.    Past Medical History:  Diagnosis Date  . Abnormal EKG   . Allergic rhinitis   . Alzheimer's disease 05/17/07  . Arthritis of knee   . Breast cancer ([place])   . Diastolic dysfunction   . Dilated aortic root ([place])   . GERD (gastroesophageal reflux disease)   . Hypertension   . Kidney stones   . Mild aortic insufficiency   . Morbid obesity ([place])   . Sleep apnea    with insomnia  . Venous (peripheral) insufficiency     Patient Active Problem List   Diagnosis Date Noted  . Chronic pain of both knees 02/11/2018  . Bilateral hip pain 02/11/2018  . Unilateral primary osteoarthritis, left knee 02/11/2018  . Unilateral primary osteoarthritis, right knee 02/11/2018  . Tinea corporis 04/29/2016  . Cellulitis, abdominal wall 04/29/2016  . Dilated aortic root ([place])   . Chronic diastolic CHF (congestive heart failure) ([place])   . Mild aortic insufficiency   . Sleep apnea   . Breast cancer ([place]) 02/08/2013  . Abnormal EKG 05/18/2011  . Dyspnea 05/18/2011  . Hypertension 05/18/2011  .  Healthcare maintenance 05/18/2011  . Alzheimer's disease 05/17/2007    Past Surgical History:  Procedure Laterality Date  . CESAREAN SECTION    . DILATION AND CURETTAGE OF UTERUS    . ECTOPIC PREGNANCY SURGERY    . KIDNEY STONE SURGERY     REMOVAL  . MASTECTOMY    . TRACHEOSTOMY       OB History   None      Home Medications    Prior to Admission medications   Medication Sig Start Date End Date Taking? Authorizing Provider  acetaminophen (TYLENOL) 500 MG tablet Take 1,000 mg by mouth daily as needed for mild pain.   Yes [provider]  aspirin 81 MG tablet Take 81 mg by mouth daily.   Yes [provider]  lisinopril-hydrochlorothiazide (PRINZIDE,ZESTORETIC) 20-12.5 MG tablet Take 1 tablet by mouth daily.   Yes [provider]  potassium chloride (K-DUR,KLOR-CON) 10 MEQ tablet Take 10 mEq by mouth daily.   Yes [provider]  Potassium Chloride (KLOR-CON PO) Take by mouth.   Yes [provider]  torsemide (DEMADEX) 20 MG tablet Take 20 mg by mouth daily.   Yes [provider]  triamcinolone (KENALOG) 0.025 % cream Apply 1 application topically daily. 03/08/18  Yes [provider]  verapamil (CALAN-SR) 180 MG CR tablet Take 180 mg by mouth daily.  08/10/17  Yes [provider]  cephALEXin (KEFLEX) 500 MG capsule Take 1 capsule (500 mg total) by mouth 4 (four) times daily. Patient not taking: Reported on 05/05/2018 06/21/17   Veryl Speak, MD  traMADol (ULTRAM) 50 MG tablet Take 1 tablet (50 mg total) by mouth every 6 (six) hours as needed. Patient not taking: Reported on 05/05/2018 08/09/17   Dorie Rank, MD    Family History Family History  Problem Relation Age of Onset  . Hypertension Mother   . Hypertension Sister   . Cancer Sister        Unknown cancer    Social History Social History   Tobacco Use  . Smoking status: Never Smoker  . Smokeless tobacco: Never Used  Substance Use Topics  . Alcohol  use: No  . Drug use: No     Allergies   Patient has no known allergies.   Review of Systems Review of Systems  Cardiovascular: Positive for leg swelling.  Skin: Positive for rash.  All other systems reviewed and are negative.    Physical Exam Updated Vital Signs BP (!) 141/83   Pulse 86   Temp 98.3 F (36.8 C) (Oral)   Resp (!) 28   Ht 4\' 8"  (1.422 m)   Wt 127 kg (280 lb)   SpO2 97%   BMI 62.77 kg/m   Physical Exam  Constitutional: She is oriented to person, place, and time. She appears well-developed.  HENT:  Head: Normocephalic.  Mouth/Throat: Oropharynx is clear and moist.  Eyes: Pupils are equal, round, and reactive to light. Conjunctivae and EOM are normal.  Neck: Normal range of motion. Neck supple.  Cardiovascular: Normal rate, regular rhythm and normal heart sounds.  Pulmonary/Chest: Effort normal and breath sounds normal. No stridor. No respiratory distress. She has no wheezes.  Abdominal: Soft. Bowel sounds are normal. She exhibits no distension. There is no tenderness. There is no guarding.  Musculoskeletal:  3+ edema L leg, redness of the L foot and calf. Mild l calf tenderness, no obvious subcutaneous air. No obvious skin breakdown, 2+ edema R leg   Neurological: She is alert and oriented to person, place, and time.  Skin: Skin is warm. There is erythema.  Psychiatric: She has a normal mood and affect.  Nursing note and vitals reviewed.    ED Treatments / Results  Labs (all labs ordered are listed, but only abnormal results are displayed) Labs Reviewed  COMPREHENSIVE METABOLIC PANEL - Abnormal; Notable for the following components:      Result Value   Potassium 3.3 (*)    Glucose, Bld 136 (*)    Calcium 8.6 (*)    Albumin 3.0 (*)    Total Bilirubin 1.3 (*)    All other components within normal limits  I-STAT CG4 LACTIC ACID, ED - Abnormal; Notable for the following components:   Lactic Acid, Venous 2.12 (*)    All other components within  normal limits  CULTURE, BLOOD (ROUTINE X 2)  CULTURE, BLOOD (ROUTINE X 2)  URINE CULTURE  BRAIN NATRIURETIC PEPTIDE  CBC WITH DIFFERENTIAL/PLATELET  URINALYSIS, ROUTINE W REFLEX MICROSCOPIC  I-STAT CG4 LACTIC ACID, ED    EKG None  Radiology Dg Tibia/fibula Left  Result Date: 05/05/2018 CLINICAL DATA:  Left foot and leg swelling, redness EXAM: LEFT TIBIA AND FIBULA - 2 VIEW COMPARISON:  None. FINDINGS: Diffuse soft tissue swelling. Degenerative changes within the left. No acute bony abnormality. Specifically, no fracture, subluxation, or dislocation. IMPRESSION: No acute bony abnormality. Electronically Signed  By: Rolm Baptise M.D.   On: 05/05/2018 17:21   Dg Foot Complete Left  Result Date: 05/05/2018 CLINICAL DATA:  Left leg and foot swelling, redness EXAM: LEFT FOOT - COMPLETE 3+ VIEW COMPARISON:  None. FINDINGS: Diffuse soft tissue swelling, particularly pronounced on the dorsum of the foot. No acute bony abnormality. Specifically, no fracture, subluxation, or dislocation. IMPRESSION: No acute bony abnormality. Electronically Signed   By: Rolm Baptise M.D.   On: 05/05/2018 17:22    Procedures Procedures (including critical care time)  Medications Ordered in ED Medications  piperacillin-tazobactam (ZOSYN) IVPB 3.375 g (0 g Intravenous Stopped 05/05/18 1802)  vancomycin (VANCOCIN) IVPB 1000 mg/200 mL premix (0 mg Intravenous Stopped 05/05/18 1802)     Initial Impression / Assessment and Plan / ED Course  I have reviewed the triage vital signs and the nursing notes.  Pertinent labs & imaging results that were available during my care of the patient were reviewed by me and considered in my medical decision making (see chart for details).    Carol Martin is a 77 y.o. female here with L leg swelling, redness. Consider DVT vs swelling from cellulitis. Low grade temp, slightly tachycardic. Consider that she might be septic from it. Will get labs, xrays, DVT study. Will order  vanc/zosyn as per protocol. Will hold off IVF as she appears volume overloaded.   6:43 PM CBC showed WBC 6. Lactate 2.1, only minimally elevated. Afebrile. Xrays showed no osteo or air. DVT study negative. Given vanc/zosyn in the ED. Will dc home with keflex, bactrim.   Final Clinical Impressions(s) / ED Diagnoses   Final diagnoses:  None    ED Discharge Orders    None       Drenda Freeze, MD 05/05/18 [phone]

## 2018-05-05 NOTE — ED Notes (Signed)
Ultrasound in with pt at the moment, unable to apply purewick.

## 2018-05-05 NOTE — ED Notes (Signed)
No repeat lactic acid per Dr. Darl Householder.   Marked pt swelling and redness on her left lower extremity and educated family member about this.

## 2018-05-10 LAB — CULTURE, BLOOD (ROUTINE X 2)
CULTURE: NO GROWTH
Culture: NO GROWTH
SPECIAL REQUESTS: ADEQUATE
Special Requests: ADEQUATE

## 2018-07-09 ENCOUNTER — Other Ambulatory Visit: Payer: Self-pay | Admitting: Neurology

## 2018-07-09 DIAGNOSIS — R4189 Other symptoms and signs involving cognitive functions and awareness: Secondary | ICD-10-CM

## 2018-08-05 ENCOUNTER — Ambulatory Visit (HOSPITAL_COMMUNITY)
Admission: RE | Admit: 2018-08-05 | Discharge: 2018-08-05 | Disposition: A | Discharge status: Home / Self Care | Payer: Medicare Other | Source: Ambulatory Visit | Attending: Neurology | Admitting: Neurology

## 2018-08-05 DIAGNOSIS — R4189 Other symptoms and signs involving cognitive functions and awareness: Secondary | ICD-10-CM

## 2018-08-05 DIAGNOSIS — R413 Other amnesia: Secondary | ICD-10-CM | POA: Insufficient documentation

## 2018-08-05 DIAGNOSIS — R9401 Abnormal electroencephalogram [EEG]: Secondary | ICD-10-CM | POA: Diagnosis not present

## 2018-08-05 NOTE — Progress Notes (Signed)
EEG complete - results pending 

## 2018-08-05 NOTE — Procedures (Signed)
History: 77 year old female with a history of memory loss.  Sedation: None  Technique: This is a 21 channel routine scalp EEG performed at the bedside with bipolar and monopolar montages arranged in accordance to the international 10/20 system of electrode placement. One channel was dedicated to EKG recording.    Background: The background consists of intermixed alpha and beta activities. There is a well defined posterior dominant rhythm of 10 Hz that attenuates with eye opening. Sleep is recorded with normal appearing structures.  There are bilateral independent   sharp waves, on the left the field is(F7>T7> Fp1, F3) on the right the field is(F8 > Fp2, F4 > T8).  There were 2 discharges recorded from the right side and only a single discharge recorded on the left.  Photic stimulation: Physiologic driving is present  EEG Abnormalities: 1) rare independent bilateral frontotemporal epileptiform discharges  Clinical Interpretation: This EEG is consistent with bilateral independent areas of potential seizure origination in the frontotemporal regions. There was no seizure recorded on this study.   Roland Rack, MD [facility] [phone]  If 7pm- 7am, please page neurology on call as listed in [place].

## 2018-10-02 IMAGING — CR DG FOOT COMPLETE 3+V*L*
3 series · 3 of 3 positions shown · non-contrast
Comparison: None.

CLINICAL DATA: Left leg and foot swelling, redness

EXAM:
LEFT FOOT - COMPLETE 3+ VIEW

[x foot lat left]
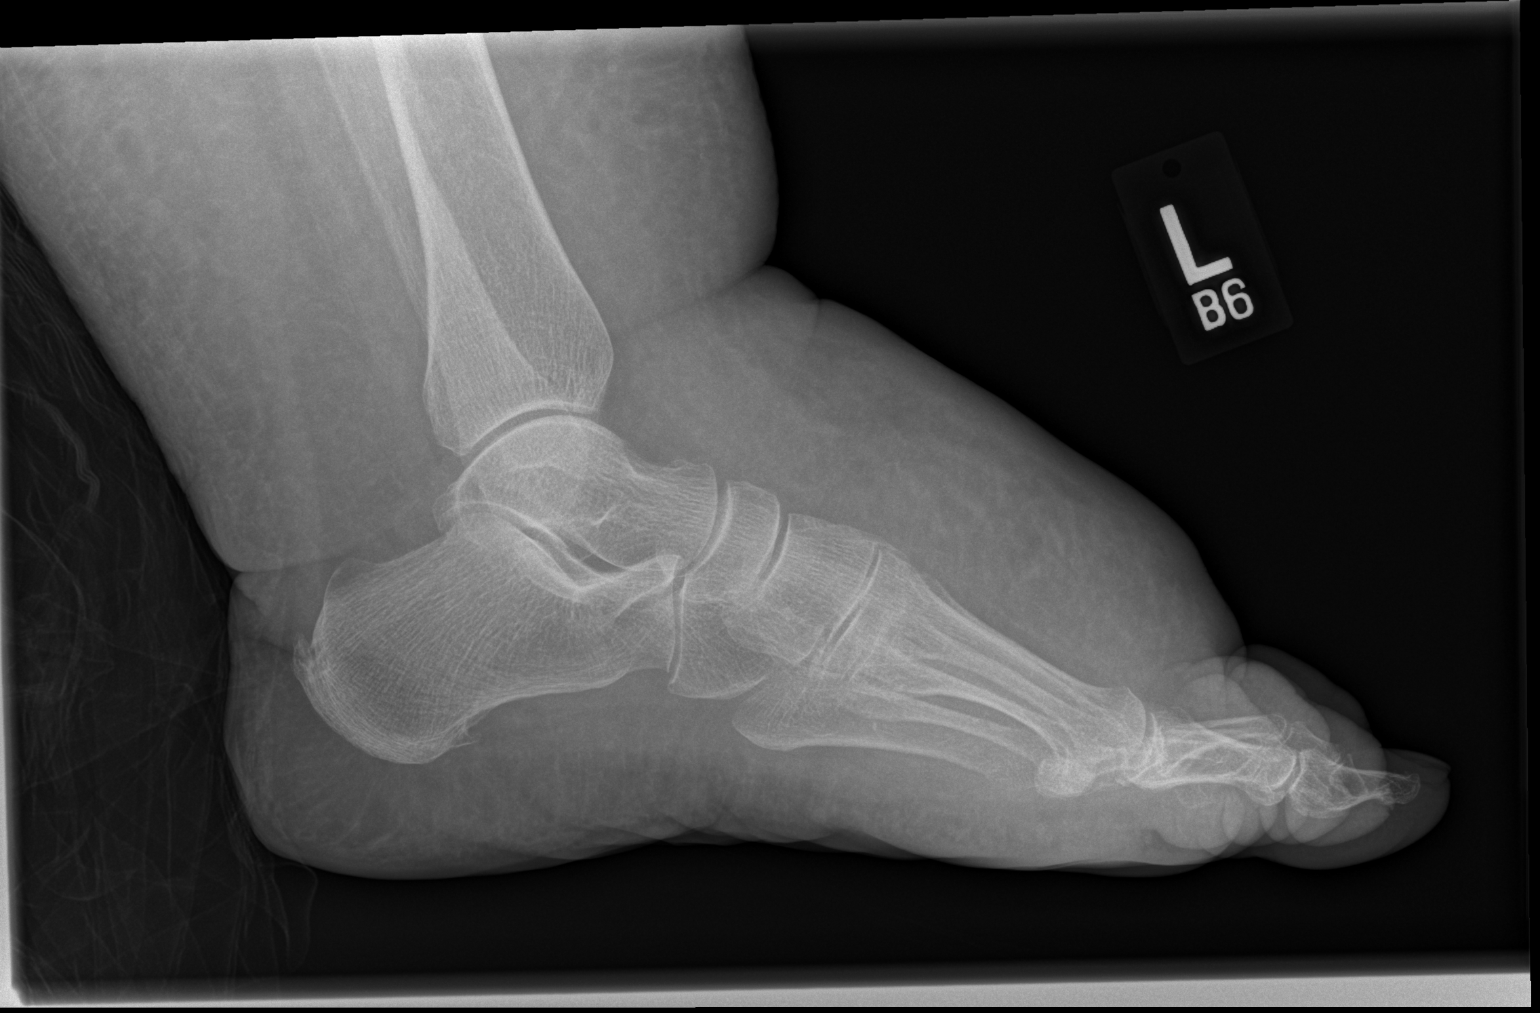

[x foot ap left]
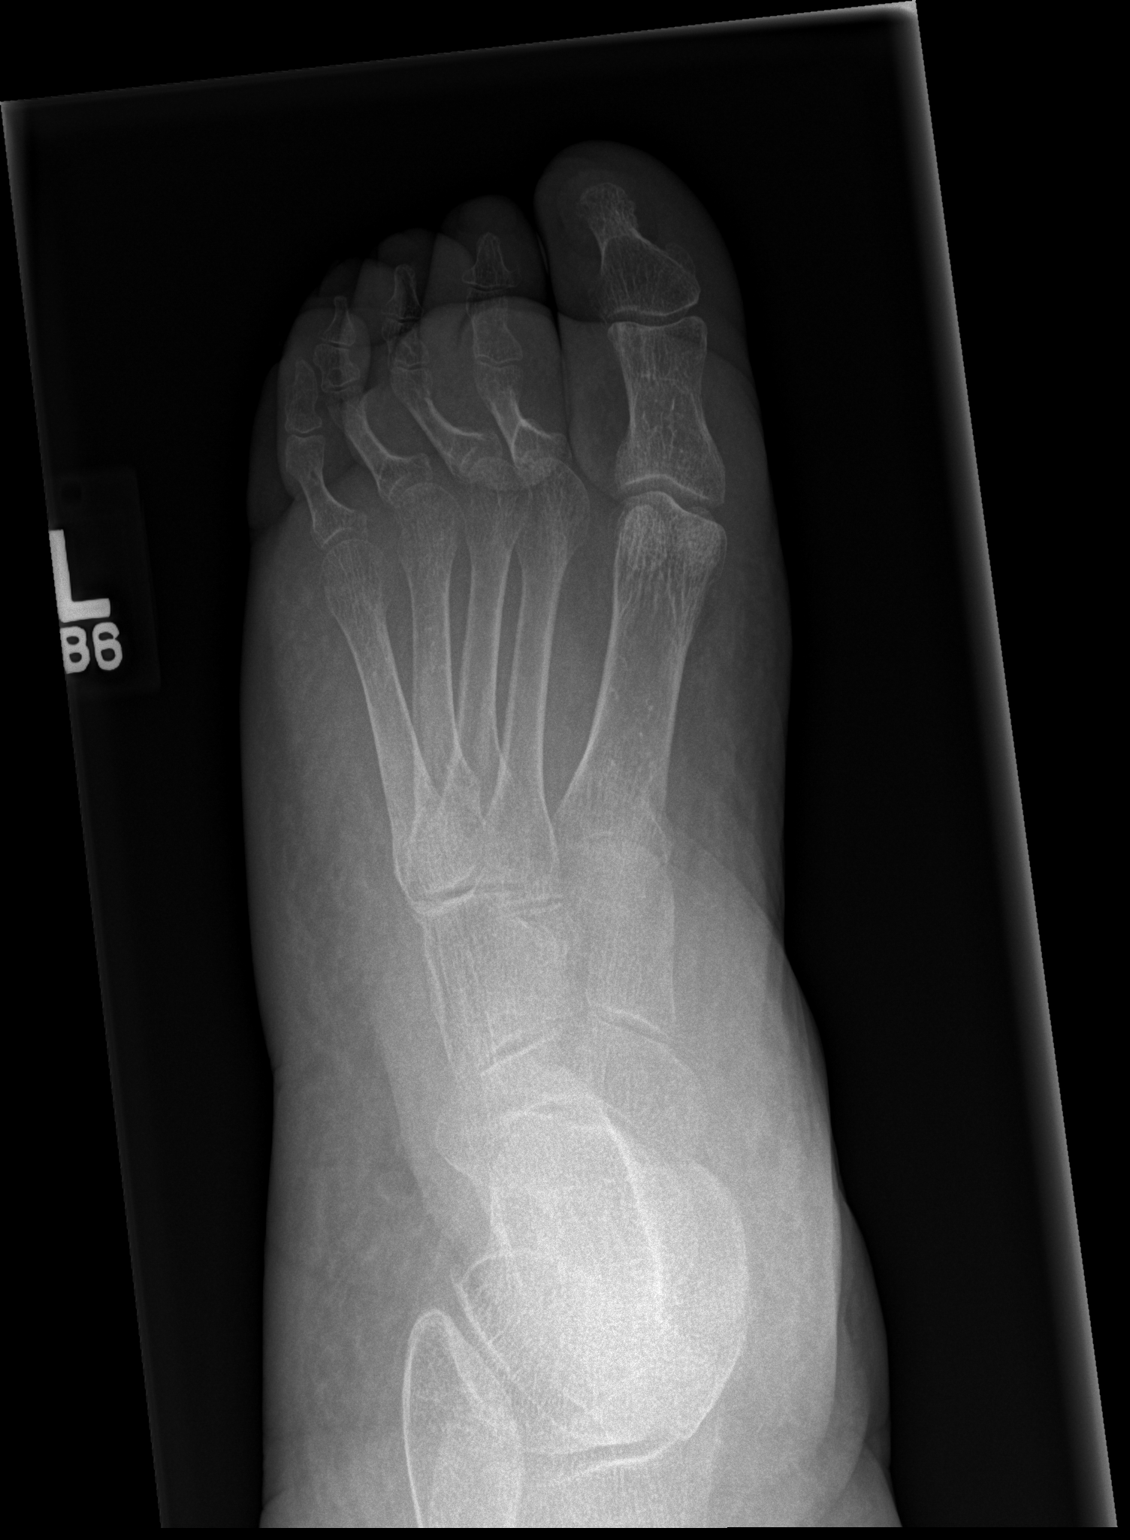

[x foot obl left]
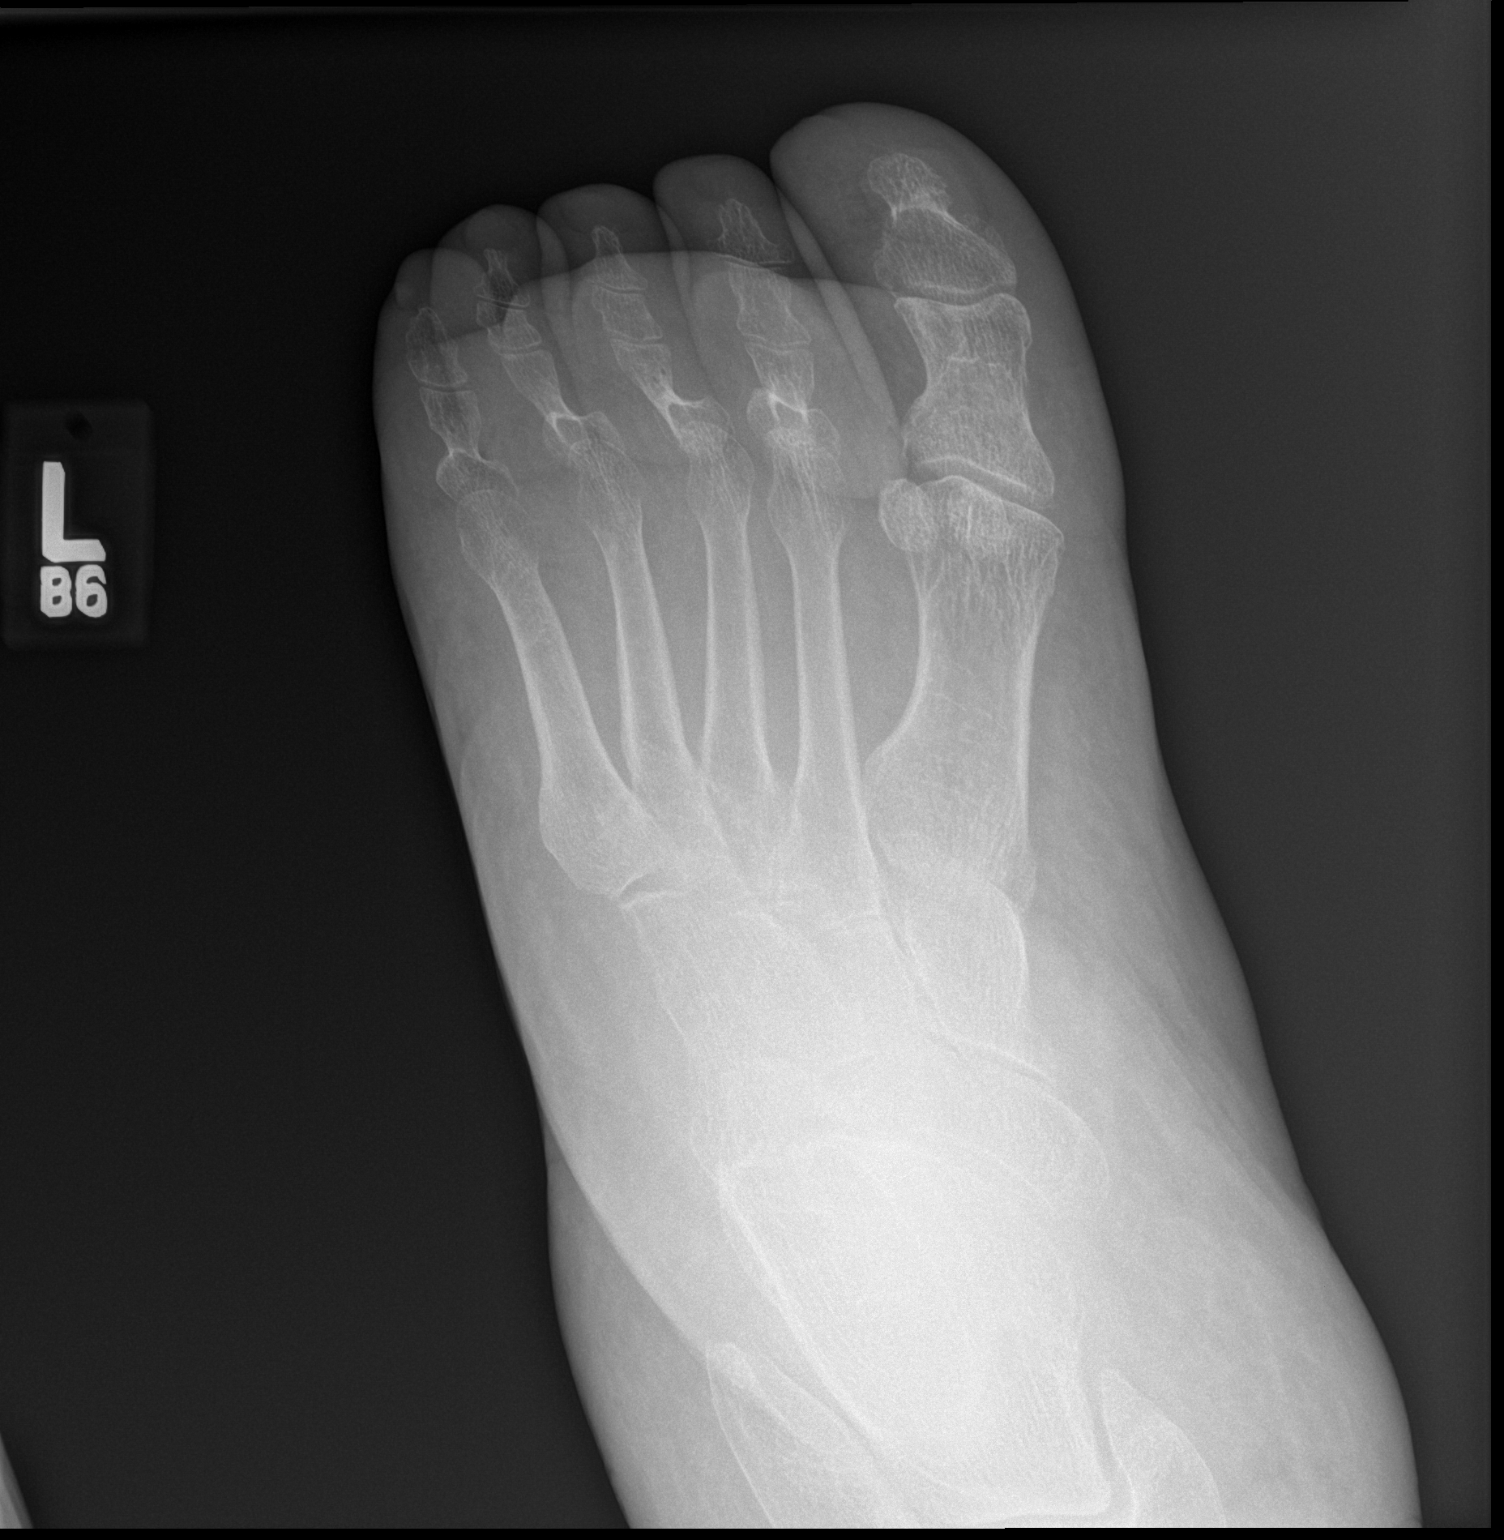

[3 of 3 positions shown; findings below may reference images not displayed]

FINDINGS: Diffuse soft tissue swelling, particularly pronounced on the dorsum
of the foot. No acute bony abnormality. Specifically, no fracture,
subluxation, or dislocation.
IMPRESSION: No acute bony abnormality.

## 2019-09-15 ENCOUNTER — Other Ambulatory Visit: Payer: Self-pay

## 2019-09-15 ENCOUNTER — Other Ambulatory Visit (HOSPITAL_COMMUNITY)
Admission: RE | Admit: 2019-09-15 | Discharge: 2019-09-15 | Disposition: A | Discharge status: Home / Self Care | Payer: Medicare Other | Source: Ambulatory Visit | Attending: Obstetrics | Admitting: Obstetrics

## 2019-09-15 ENCOUNTER — Encounter: Payer: Self-pay | Admitting: Obstetrics

## 2019-09-15 ENCOUNTER — Ambulatory Visit (INDEPENDENT_AMBULATORY_CARE_PROVIDER_SITE_OTHER): Payer: Medicare Other | Admitting: Obstetrics

## 2019-09-15 VITALS — BP 142/97 | HR 81 | Wt 294.0 lb

## 2019-09-15 DIAGNOSIS — F0280 Dementia in other diseases classified elsewhere without behavioral disturbance: Secondary | ICD-10-CM

## 2019-09-15 DIAGNOSIS — N898 Other specified noninflammatory disorders of vagina: Secondary | ICD-10-CM | POA: Insufficient documentation

## 2019-09-15 DIAGNOSIS — Z1151 Encounter for screening for human papillomavirus (HPV): Secondary | ICD-10-CM | POA: Insufficient documentation

## 2019-09-15 DIAGNOSIS — Z780 Asymptomatic menopausal state: Secondary | ICD-10-CM | POA: Insufficient documentation

## 2019-09-15 DIAGNOSIS — Z01419 Encounter for gynecological examination (general) (routine) without abnormal findings: Secondary | ICD-10-CM

## 2019-09-15 DIAGNOSIS — I10 Essential (primary) hypertension: Secondary | ICD-10-CM

## 2019-09-15 DIAGNOSIS — I5032 Chronic diastolic (congestive) heart failure: Secondary | ICD-10-CM

## 2019-09-15 DIAGNOSIS — G301 Alzheimer's disease with late onset: Secondary | ICD-10-CM

## 2019-09-15 DIAGNOSIS — Z853 Personal history of malignant neoplasm of breast: Secondary | ICD-10-CM

## 2019-09-15 DIAGNOSIS — M159 Polyosteoarthritis, unspecified: Secondary | ICD-10-CM

## 2019-09-15 NOTE — Progress Notes (Signed)
Subjective:        Carol Martin is a 78 y.o. female here for a routine exam.  Current complaints: Rash in left inguinal area that comes and goes.   Sore on vulva that was treated by her PCP, along with the rash.  Personal health questionnaire:  Is patient Ashkenazi Jewish, have a family history of breast and/or ovarian cancer: yes Is there a family history of uterine cancer diagnosed at age < 17, gastrointestinal cancer, urinary tract cancer, family member who is a Field seismologist syndrome-associated carrier: yes Is the patient overweight and hypertensive, family history of diabetes, personal history of gestational diabetes, preeclampsia or PCOS: yes Is patient over 74, have PCOS,  family history of premature CHD under age 72, diabetes, smoke, have hypertension or peripheral artery disease:  yes At any time, has a partner hit, kicked or otherwise hurt or frightened you?: no Over the past 2 weeks, have you felt down, depressed or hopeless?: no Over the past 2 weeks, have you felt little interest or pleasure in doing things?:no   Gynecologic History No LMP recorded. Patient is postmenopausal. Contraception: post menopausal status Last Pap: 2015. Results were: normal Last mammogram: 2016. Results were: normal  Obstetric History OB History  No obstetric history on file.    Past Medical History:  Diagnosis Date  . Abnormal EKG   . Allergic rhinitis   . Alzheimer's disease ([place]) 05/17/07  . Arthritis of knee   . Breast cancer ([place])   . Diastolic dysfunction   . Dilated aortic root ([place])   . GERD (gastroesophageal reflux disease)   . Hypertension   . Kidney stones   . Mild aortic insufficiency   . Morbid obesity ([place])   . Sleep apnea    with insomnia  . Venous (peripheral) insufficiency     Past Surgical History:  Procedure Laterality Date  . CESAREAN SECTION    . DILATION AND CURETTAGE OF UTERUS    . ECTOPIC PREGNANCY SURGERY    . KIDNEY STONE SURGERY     REMOVAL  .  MASTECTOMY     R breast   . TRACHEOSTOMY       Current Outpatient Medications:  .  acetaminophen (TYLENOL) 500 MG tablet, Take 1,000 mg by mouth daily as needed for mild pain., Disp: , Rfl:  .  aspirin 81 MG tablet, Take 81 mg by mouth daily., Disp: , Rfl:  .  lisinopril-hydrochlorothiazide (PRINZIDE,ZESTORETIC) 20-12.5 MG tablet, Take 1 tablet by mouth daily., Disp: , Rfl:  .  potassium chloride (K-DUR,KLOR-CON) 10 MEQ tablet, Take 10 mEq by mouth daily., Disp: , Rfl:  .  torsemide (DEMADEX) 20 MG tablet, Take 20 mg by mouth daily., Disp: , Rfl:  .  triamcinolone (KENALOG) 0.025 % cream, Apply 1 application topically daily., Disp: , Rfl: 1 .  verapamil (CALAN-SR) 180 MG CR tablet, Take 180 mg by mouth daily. , Disp: , Rfl: 0 .  cephALEXin (KEFLEX) 500 MG capsule, Take 2 capsules (1,000 mg total) by mouth 3 (three) times daily. (Patient not taking: Reported on 09/15/2019), Disp: 40 capsule, Rfl: 0 .  Potassium Chloride (KLOR-CON PO), Take by mouth., Disp: , Rfl:  .  traMADol (ULTRAM) 50 MG tablet, Take 1 tablet (50 mg total) by mouth every 6 (six) hours as needed. (Patient not taking: Reported on 05/05/2018), Disp: 15 tablet, Rfl: 0 No Known Allergies  Social History   Tobacco Use  . Smoking status: Never Smoker  . Smokeless tobacco: Never Used  Substance Use Topics  . Alcohol use: No    Family History  Problem Relation Age of Onset  . Hypertension Mother   . Hypertension Sister   . Cancer Sister        Unknown cancer      Review of Systems  Constitutional: negative for fatigue and weight loss Respiratory: negative for cough and wheezing Cardiovascular: negative for chest pain, fatigue and palpitations Gastrointestinal: negative for abdominal pain and change in bowel habits Musculoskeletal:negative for myalgias Neurological: negative for gait problems and tremors Behavioral/Psych: negative for abusive relationship, depression Endocrine: negative for temperature intolerance     Genitourinary:negative for abnormal menstrual periods, genital lesions, hot flashes, sexual problems and vaginal discharge Integument/breast: negative for breast lump, breast tenderness, nipple discharge and skin lesion(s)    Objective:       BP (!) 142/97   Pulse 81   Wt 294 lb (133.4 kg)   BMI 65.91 kg/m  General:   alert  Skin:   no rash or abnormalities  Lungs:   clear to auscultation bilaterally  Heart:   regular rate and rhythm, S1, S2 normal, no murmur, click, rub or gallop  Breasts:   normal without suspicious masses, skin or nipple changes or axillary nodes  Abdomen:  normal findings: no organomegaly, soft, non-tender and no hernia  Pelvis:  External genitalia: normal general appearance Urinary system: urethral meatus normal and bladder without fullness, nontender Vaginal: normal without tenderness, induration or masses Cervix: normal appearance Adnexa: normal bimanual exam Uterus: anteverted and non-tender, normal size   Lab Review Urine pregnancy test Labs reviewed yes Radiologic studies reviewed yes  50% of 25 min visit spent on counseling and coordination of care.   Assessment:     1. Encounter for routine gynecological examination with Papanicolaou smear of cervix Rx: - Cytology - PAP( [place]) >30  2. Vaginal discharge Rx: - Cervicovaginal ancillary only( [place])  3. Postmenopause  4. History of breast cancer  5. Class 3 severe obesity with serious comorbidity and body mass index (BMI) of 60.0 to 69.9 in adult, unspecified obesity type ([place]) - managed by PCP  6. Chronic diastolic congestive heart failure ([place]) - managed by PCP  7. HTN (hypertension), benign - managed by PCP  8. Osteoarthritis of multiple joints, unspecified osteoarthritis type - managed by Ortho  9. Late onset Alzheimer's disease without behavioral disturbance ([place]) - managed by PCP    Plan:    Education reviewed: calcium supplements, depression evaluation, low  fat, low cholesterol diet, self breast exams and weight bearing exercise. Follow up in: 1 year.   Need to obtain previous records  Shelly Bombard, MD 09/15/2019 1:04 PM

## 2019-09-15 NOTE — Progress Notes (Signed)
NGYN pt referred d/t vulvar ulcer Currently living with daughter/caregiver in [place]. BP 142/97 - Pt did not take BP meds this morning.  Pt c/o intermittent L inguinal pain x few months.  Negative pap 09/2014   Unsure last Mammogram per caregiver Unsure last colonoscopy  Unsure last Bone Density

## 2019-09-16 LAB — CERVICOVAGINAL ANCILLARY ONLY
Bacterial Vaginitis (gardnerella): POSITIVE — AB
Candida Glabrata: NEGATIVE
Candida Vaginitis: NEGATIVE
Comment: NEGATIVE
Comment: NEGATIVE
Comment: NEGATIVE
Comment: NEGATIVE
Trichomonas: NEGATIVE

## 2019-09-17 ENCOUNTER — Other Ambulatory Visit: Payer: Self-pay | Admitting: Obstetrics

## 2019-09-17 DIAGNOSIS — N760 Acute vaginitis: Secondary | ICD-10-CM

## 2019-09-17 DIAGNOSIS — B9689 Other specified bacterial agents as the cause of diseases classified elsewhere: Secondary | ICD-10-CM

## 2019-09-17 MED ORDER — METRONIDAZOLE 500 MG PO TABS: 500.0000 mg | ORAL_TABLET | Freq: Two times a day (BID) | ORAL | 2 refills | Status: DC

## 2019-09-19 LAB — CYTOLOGY - PAP
Comment: NEGATIVE
Diagnosis: NEGATIVE
High risk HPV: NEGATIVE

## 2020-06-23 ENCOUNTER — Encounter (HOSPITAL_COMMUNITY): Payer: Self-pay

## 2020-06-23 ENCOUNTER — Emergency Department (HOSPITAL_COMMUNITY): Payer: Medicare Other

## 2020-06-23 ENCOUNTER — Other Ambulatory Visit: Payer: Self-pay

## 2020-06-23 ENCOUNTER — Inpatient Hospital Stay (HOSPITAL_COMMUNITY)
Admission: EM | Admit: 2020-06-23 | Discharge: 2020-06-28 | DRG: 177 | Disposition: A | Discharge status: Home Health | Payer: Medicare Other | Attending: Family Medicine | Admitting: Family Medicine

## 2020-06-23 DIAGNOSIS — F0280 Dementia in other diseases classified elsewhere without behavioral disturbance: Secondary | ICD-10-CM | POA: Diagnosis present

## 2020-06-23 DIAGNOSIS — I5032 Chronic diastolic (congestive) heart failure: Secondary | ICD-10-CM | POA: Diagnosis present

## 2020-06-23 DIAGNOSIS — Z7982 Long term (current) use of aspirin: Secondary | ICD-10-CM

## 2020-06-23 DIAGNOSIS — J1282 Pneumonia due to coronavirus disease 2019: Secondary | ICD-10-CM | POA: Diagnosis present

## 2020-06-23 DIAGNOSIS — M25551 Pain in right hip: Secondary | ICD-10-CM

## 2020-06-23 DIAGNOSIS — I77810 Thoracic aortic ectasia: Secondary | ICD-10-CM | POA: Diagnosis present

## 2020-06-23 DIAGNOSIS — R9401 Abnormal electroencephalogram [EEG]: Secondary | ICD-10-CM | POA: Diagnosis present

## 2020-06-23 DIAGNOSIS — R569 Unspecified convulsions: Secondary | ICD-10-CM | POA: Diagnosis present

## 2020-06-23 DIAGNOSIS — K219 Gastro-esophageal reflux disease without esophagitis: Secondary | ICD-10-CM | POA: Diagnosis present

## 2020-06-23 DIAGNOSIS — Z79899 Other long term (current) drug therapy: Secondary | ICD-10-CM

## 2020-06-23 DIAGNOSIS — Z6841 Body Mass Index (BMI) 40.0 and over, adult: Secondary | ICD-10-CM

## 2020-06-23 DIAGNOSIS — R4182 Altered mental status, unspecified: Secondary | ICD-10-CM | POA: Diagnosis not present

## 2020-06-23 DIAGNOSIS — Z87442 Personal history of urinary calculi: Secondary | ICD-10-CM

## 2020-06-23 DIAGNOSIS — M25561 Pain in right knee: Secondary | ICD-10-CM | POA: Diagnosis present

## 2020-06-23 DIAGNOSIS — G309 Alzheimer's disease, unspecified: Secondary | ICD-10-CM | POA: Diagnosis present

## 2020-06-23 DIAGNOSIS — E860 Dehydration: Secondary | ICD-10-CM | POA: Diagnosis present

## 2020-06-23 DIAGNOSIS — Z8249 Family history of ischemic heart disease and other diseases of the circulatory system: Secondary | ICD-10-CM | POA: Diagnosis not present

## 2020-06-23 DIAGNOSIS — G8929 Other chronic pain: Secondary | ICD-10-CM | POA: Diagnosis present

## 2020-06-23 DIAGNOSIS — I872 Venous insufficiency (chronic) (peripheral): Secondary | ICD-10-CM | POA: Diagnosis present

## 2020-06-23 DIAGNOSIS — M25562 Pain in left knee: Secondary | ICD-10-CM | POA: Diagnosis present

## 2020-06-23 DIAGNOSIS — Z9011 Acquired absence of right breast and nipple: Secondary | ICD-10-CM

## 2020-06-23 DIAGNOSIS — U071 COVID-19: Secondary | ICD-10-CM | POA: Diagnosis present

## 2020-06-23 DIAGNOSIS — R0603 Acute respiratory distress: Secondary | ICD-10-CM | POA: Diagnosis not present

## 2020-06-23 DIAGNOSIS — I10 Essential (primary) hypertension: Secondary | ICD-10-CM | POA: Diagnosis not present

## 2020-06-23 DIAGNOSIS — R7989 Other specified abnormal findings of blood chemistry: Secondary | ICD-10-CM | POA: Diagnosis present

## 2020-06-23 DIAGNOSIS — I110 Hypertensive heart disease with heart failure: Secondary | ICD-10-CM | POA: Diagnosis present

## 2020-06-23 DIAGNOSIS — R0902 Hypoxemia: Secondary | ICD-10-CM | POA: Diagnosis present

## 2020-06-23 DIAGNOSIS — G301 Alzheimer's disease with late onset: Secondary | ICD-10-CM | POA: Diagnosis not present

## 2020-06-23 DIAGNOSIS — Z853 Personal history of malignant neoplasm of breast: Secondary | ICD-10-CM

## 2020-06-23 DIAGNOSIS — R0600 Dyspnea, unspecified: Secondary | ICD-10-CM | POA: Diagnosis present

## 2020-06-23 LAB — CBG MONITORING, ED: Glucose-Capillary: 89 mg/dL (ref 70–99)

## 2020-06-23 LAB — COMPREHENSIVE METABOLIC PANEL
ALT: 12 U/L (ref 0–44)
AST: 26 U/L (ref 15–41)
Albumin: 3.4 g/dL — ABNORMAL LOW (ref 3.5–5.0)
Alkaline Phosphatase: 87 U/L (ref 38–126)
Anion gap: 13 (ref 5–15)
BUN: 7 mg/dL — ABNORMAL LOW (ref 8–23)
CO2: 24 mmol/L (ref 22–32)
Calcium: 8.9 mg/dL (ref 8.9–10.3)
Chloride: 103 mmol/L (ref 98–111)
Creatinine, Ser: 0.84 mg/dL (ref 0.44–1.00)
GFR calc Af Amer: >60 mL/min (ref >60)
GFR calc non Af Amer: >60 mL/min (ref >60)
Glucose, Bld: 119 mg/dL — ABNORMAL HIGH (ref 70–99)
Potassium: 3.6 mmol/L (ref 3.5–5.1)
Sodium: 140 mmol/L (ref 135–145)
Total Bilirubin: 1.1 mg/dL (ref 0.3–1.2)
Total Protein: 6.7 g/dL (ref 6.5–8.1)

## 2020-06-23 LAB — URINALYSIS, ROUTINE W REFLEX MICROSCOPIC
Bacteria, UA: NONE SEEN
Bilirubin Urine: NEGATIVE
Glucose, UA: NEGATIVE mg/dL
Ketones, ur: NEGATIVE mg/dL
Leukocytes,Ua: NEGATIVE
Nitrite: NEGATIVE
Protein, ur: NEGATIVE mg/dL
Specific Gravity, Urine: 1.01 (ref 1.005–1.030)
pH: 7 (ref 5.0–8.0)

## 2020-06-23 LAB — BRAIN NATRIURETIC PEPTIDE: B Natriuretic Peptide: 105.9 pg/mL — ABNORMAL HIGH (ref 0.0–100.0)

## 2020-06-23 LAB — MAGNESIUM: Magnesium: 1.9 mg/dL (ref 1.7–2.4)

## 2020-06-23 LAB — LIPASE, BLOOD: Lipase: 19 U/L (ref 11–51)

## 2020-06-23 LAB — LACTIC ACID, PLASMA: Lactic Acid, Venous: 1.4 mmol/L (ref 0.5–1.9)

## 2020-06-23 LAB — TRIGLYCERIDES: Triglycerides: 34 mg/dL (ref <150)

## 2020-06-23 LAB — CBC WITH DIFFERENTIAL/PLATELET
Abs Immature Granulocytes: 0.05 10*3/uL (ref 0.00–0.07)
Basophils Absolute: 0 10*3/uL (ref 0.0–0.1)
Basophils Relative: 1 %
Eosinophils Absolute: 0 10*3/uL (ref 0.0–0.5)
Eosinophils Relative: 1 %
HCT: 44.2 % (ref 36.0–46.0)
Hemoglobin: 14.2 g/dL (ref 12.0–15.0)
Immature Granulocytes: 1 %
Lymphocytes Relative: 4 %
Lymphs Abs: 0.3 10*3/uL — ABNORMAL LOW (ref 0.7–4.0)
MCH: 30.7 pg (ref 26.0–34.0)
MCHC: 32.1 g/dL (ref 30.0–36.0)
MCV: 95.5 fL (ref 80.0–100.0)
Monocytes Absolute: 0.6 10*3/uL (ref 0.1–1.0)
Monocytes Relative: 8 %
Neutro Abs: 6.8 10*3/uL (ref 1.7–7.7)
Neutrophils Relative %: 85 %
Platelets: 187 10*3/uL (ref 150–400)
RBC: 4.63 MIL/uL (ref 3.87–5.11)
RDW: 13.2 % (ref 11.5–15.5)
WBC: 7.9 10*3/uL (ref 4.0–10.5)
nRBC: 0 % (ref 0.0–0.2)

## 2020-06-23 LAB — FERRITIN: Ferritin: 66 ng/mL (ref 11–307)

## 2020-06-23 LAB — TROPONIN I (HIGH SENSITIVITY)
Troponin I (High Sensitivity): 18 ng/L — ABNORMAL HIGH (ref <18)
Troponin I (High Sensitivity): 21 ng/L — ABNORMAL HIGH (ref <18)

## 2020-06-23 LAB — C-REACTIVE PROTEIN: CRP: 4.3 mg/dL — ABNORMAL HIGH (ref <1.0)

## 2020-06-23 LAB — SARS CORONAVIRUS 2 BY RT PCR (HOSPITAL ORDER, PERFORMED IN ~~LOC~~ HOSPITAL LAB): SARS Coronavirus 2: POSITIVE — AB

## 2020-06-23 LAB — AMMONIA: Ammonia: 11 umol/L (ref 9–35)

## 2020-06-23 LAB — LACTATE DEHYDROGENASE: LDH: 177 U/L (ref 98–192)

## 2020-06-23 LAB — FIBRINOGEN: Fibrinogen: 440 mg/dL (ref 210–475)

## 2020-06-23 LAB — D-DIMER, QUANTITATIVE: D-Dimer, Quant: 1.63 ug/mL-FEU — ABNORMAL HIGH (ref 0.00–0.50)

## 2020-06-23 MED ORDER — LISINOPRIL-HYDROCHLOROTHIAZIDE 20-12.5 MG PO TABS: 1.0000 | ORAL_TABLET | Freq: Every day | ORAL | Status: DC

## 2020-06-23 MED ORDER — ALBUTEROL SULFATE HFA 108 (90 BASE) MCG/ACT IN AERS
2.0000 | INHALATION_SPRAY | Freq: Four times a day (QID) | RESPIRATORY_TRACT | Status: DC
  Administered 2020-06-24 – 2020-06-28 (×16): 2 via RESPIRATORY_TRACT
  Filled 2020-06-23 (×2): qty 6.7

## 2020-06-23 MED ORDER — SODIUM CHLORIDE 0.9 % IV SOLN
100.0000 mg | INTRAVENOUS | Status: AC
  Administered 2020-06-24 – 2020-06-27 (×4): 100 mg via INTRAVENOUS
  Filled 2020-06-23 (×5): qty 20

## 2020-06-23 MED ORDER — HYDROCHLOROTHIAZIDE 12.5 MG PO CAPS
12.5000 mg | ORAL_CAPSULE | Freq: Every day | ORAL | Status: DC
  Administered 2020-06-24 – 2020-06-28 (×5): 12.5 mg via ORAL
  Filled 2020-06-23 (×5): qty 1

## 2020-06-23 MED ORDER — SODIUM CHLORIDE 0.9 % IV SOLN: INTRAVENOUS | Status: DC

## 2020-06-23 MED ORDER — DEXAMETHASONE SODIUM PHOSPHATE 10 MG/ML IJ SOLN
6.0000 mg | INTRAMUSCULAR | Status: DC
  Administered 2020-06-25 – 2020-06-27 (×4): 6 mg via INTRAVENOUS
  Filled 2020-06-23 (×5): qty 1

## 2020-06-23 MED ORDER — SODIUM CHLORIDE 0.9% FLUSH
3.0000 mL | Freq: Two times a day (BID) | INTRAVENOUS | Status: DC
  Administered 2020-06-24 – 2020-06-28 (×9): 3 mL via INTRAVENOUS

## 2020-06-23 MED ORDER — LISINOPRIL 20 MG PO TABS
20.0000 mg | ORAL_TABLET | Freq: Every day | ORAL | Status: DC
  Administered 2020-06-24 – 2020-06-28 (×5): 20 mg via ORAL
  Filled 2020-06-23 (×6): qty 1

## 2020-06-23 MED ORDER — INSULIN ASPART 100 UNIT/ML ~~LOC~~ SOLN
0.0000 [IU] | Freq: Three times a day (TID) | SUBCUTANEOUS | Status: DC
  Administered 2020-06-24 – 2020-06-26 (×7): 2 [IU] via SUBCUTANEOUS
  Administered 2020-06-27: 3 [IU] via SUBCUTANEOUS
  Administered 2020-06-27: 2 [IU] via SUBCUTANEOUS
  Administered 2020-06-28: 3 [IU] via SUBCUTANEOUS

## 2020-06-23 MED ORDER — ACETAMINOPHEN 500 MG PO TABS: 1000.0000 mg | ORAL_TABLET | Freq: Three times a day (TID) | ORAL | Status: DC | PRN

## 2020-06-23 MED ORDER — ACETAMINOPHEN 650 MG RE SUPP
650.0000 mg | Freq: Once | RECTAL | Status: AC
  Administered 2020-06-23: 650 mg via RECTAL
  Filled 2020-06-23: qty 1

## 2020-06-23 MED ORDER — ONDANSETRON HCL 4 MG/2ML IJ SOLN: 4.0000 mg | Freq: Four times a day (QID) | INTRAMUSCULAR | Status: DC | PRN

## 2020-06-23 MED ORDER — ONDANSETRON HCL 4 MG PO TABS: 4.0000 mg | ORAL_TABLET | Freq: Four times a day (QID) | ORAL | Status: DC | PRN

## 2020-06-23 MED ORDER — SODIUM CHLORIDE 0.9 % IV SOLN
200.0000 mg | Freq: Once | INTRAVENOUS | Status: AC
  Administered 2020-06-23: 200 mg via INTRAVENOUS
  Filled 2020-06-23: qty 40

## 2020-06-23 MED ORDER — TORSEMIDE 20 MG PO TABS
20.0000 mg | ORAL_TABLET | Freq: Every day | ORAL | Status: DC
  Administered 2020-06-24 – 2020-06-28 (×5): 20 mg via ORAL
  Filled 2020-06-23 (×5): qty 1

## 2020-06-23 MED ORDER — ENOXAPARIN SODIUM 40 MG/0.4ML ~~LOC~~ SOLN
40.0000 mg | Freq: Every day | SUBCUTANEOUS | Status: DC
  Administered 2020-06-24 – 2020-06-25 (×3): 40 mg via SUBCUTANEOUS
  Filled 2020-06-23 (×3): qty 0.4

## 2020-06-23 MED ORDER — DEXAMETHASONE SODIUM PHOSPHATE 10 MG/ML IJ SOLN
6.0000 mg | Freq: Once | INTRAMUSCULAR | Status: AC
  Administered 2020-06-24: 6 mg via INTRAVENOUS

## 2020-06-23 MED ORDER — POTASSIUM CHLORIDE CRYS ER 10 MEQ PO TBCR
10.0000 meq | EXTENDED_RELEASE_TABLET | Freq: Every day | ORAL | Status: DC
  Administered 2020-06-24 – 2020-06-28 (×5): 10 meq via ORAL
  Filled 2020-06-23 (×5): qty 1

## 2020-06-23 MED ORDER — VERAPAMIL HCL ER 180 MG PO TBCR
180.0000 mg | EXTENDED_RELEASE_TABLET | Freq: Every day | ORAL | Status: DC
  Administered 2020-06-24 – 2020-06-28 (×5): 180 mg via ORAL
  Filled 2020-06-23 (×5): qty 1

## 2020-06-23 NOTE — H&P (Signed)
History and Physical    Carol Martin ZRA:[id number] DOB: 12-10-1940 DOA: 06/23/2020  PCP: Nolene Ebbs, MD  Patient coming from: Home  Chief Complaint: confusion  HPI: Carol Martin is a 79 y.o. female with medical history significant of HTN, GERD, HFpEF (chronic, last TTE in 2015 with preserved EF and severe LVH), H/O seizures, not on medication, Alzheimer's Disease (mild), arthritis, h/o breast cancer who presents for encephalopathy.  Patient's daughter is in room and provides much of the history.  Patient has not been feeling well for the past 3-4 days.  She has a sick contact in her daughter who is currently ill with COVID 19. Today, her daughter went to check on her and found her on the floor, presumably fell from the bed, and she was confused.  EMS was called and patient was mildly hypoxic and oriented to self only.  She had a fever and tachycardia in the ED.  She has not been eating and drinking well per her daughter.  She is having a productive cough as of today.  She has not had any fluid overload.  She notes feeling achy all over and having pain in her knees which is chronic for her. When I saw her, she was oriented to herself, hospital, year and knew who her daughter was, which is an improvement.  She has a history of seizures, but is currently not on medications.   Feasibly, she could have had a seizure causing the fall and post-ictal state as well.  Further symptoms include mild SOB, decreased alertness and decreased PO intake.   ED Course: In the ED, she was found to be febrile with a mild tachycardia.  EKG showed TWI in I and AVL along with prolonged PR interval.  Initial Blood work was relatively normal with mildly low protein, normal WBC, mild lymphopenia. COVID 19 labs were drawn showing an Elevated D dimer.   Fibrinogen of 440.  Patient is saturating well on room air and/or 1-2 L of oxygen.  Remdesivir and dexamethasone were started.  Other inflammatory labwork is pending.  CXR  did not reveal a pneumonia, but did show some mild edema.  Patients daughter notes that she is not on a fluid restriction.   Review of Systems: As per HPI otherwise all other systems reviewed and are negative.   Past Medical History:  Diagnosis Date  . Abnormal EKG   . Allergic rhinitis   . Alzheimer's disease ([place]) 05/17/07  . Arthritis of knee   . Breast cancer ([place])   . Diastolic dysfunction   . Dilated aortic root ([place])   . GERD (gastroesophageal reflux disease)   . Hypertension   . Kidney stones   . Mild aortic insufficiency   . Morbid obesity ([place])   . Sleep apnea    with insomnia  . Venous (peripheral) insufficiency     Past Surgical History:  Procedure Laterality Date  . CESAREAN SECTION    . DILATION AND CURETTAGE OF UTERUS    . ECTOPIC PREGNANCY SURGERY    . KIDNEY STONE SURGERY     REMOVAL  . MASTECTOMY     R breast   . TRACHEOSTOMY      Social History  reports that she has never smoked. She has never used smokeless tobacco. She reports that she does not drink alcohol and does not use drugs.  No Known Allergies  Family History  Problem Relation Age of Onset  . Hypertension Mother   . Hypertension Sister   .  Cancer Sister        Unknown cancer     Prior to Admission medications   Medication Sig Start Date End Date Taking? Authorizing Provider  acetaminophen (TYLENOL) 500 MG tablet Take 1,000 mg by mouth daily as needed for mild pain.    [provider]  aspirin 81 MG tablet Take 81 mg by mouth daily.    [provider]         lisinopril-hydrochlorothiazide (PRINZIDE,ZESTORETIC) 20-12.5 MG tablet Take 1 tablet by mouth daily.    [provider]         potassium chloride (K-DUR,KLOR-CON) 10 MEQ tablet Take 10 mEq by mouth daily.    [provider]                       triamcinolone (KENALOG) 0.025 % cream Apply 1 application topically daily. 03/08/18   [provider]  verapamil (CALAN-SR) 180 MG CR  tablet Take 180 mg by mouth daily.  08/10/17   [provider]    Physical Exam: Vitals:   06/23/20 1700 06/23/20 1708 06/23/20 1900 06/23/20 1950  BP: (!) 144/89  (!) 166/88 (!) 167/85  Pulse: (!) 107  (!) 101 (!) 102  Resp: (!) 29  (!) 26 (!) 24  Temp: (!) 102.7 F (39.3 C)     TempSrc: Oral     SpO2: 100% 100% 100% 95%    Constitutional: Lying in bed, appears uncomfortable, oxygen in place, alert to voice Eyes: Lids normal, no conjunctival pallor or icterus  ENMT: MM are dry.  NCAT Respiratory: CTAB, no wheezing or rales Cardiovascular: Tachycardic, regular, normal pulses in radial arteries bilaterally.  She has non pitting chronic edema to bilateral LE with chronic skin changes.  Abdomen: Obese, +BS, NT, ND Musculoskeletal: no clubbing / cyanosis. No joint deformities, enlarged Lower extremities Skin: no rashes, lesions, ulcers on exposed skin Neurologic: She will awake to voice, moves slowly in bed, no gross strength abnormality.  She is hard of hearing.  Psychiatric: Oriented X 3, affect is flat   Labs on Admission: I have personally reviewed following labs and imaging studies  CBC: Recent Labs  Lab 06/23/20 1717  WBC 7.9  NEUTROABS 6.8  HGB 14.2  HCT 44.2  MCV 95.5  PLT 696    Basic Metabolic Panel: Recent Labs  Lab 06/23/20 1717  NA 140  K 3.6  CL 103  CO2 24  GLUCOSE 119*  BUN 7*  CREATININE 0.84  CALCIUM 8.9  MG 1.9    GFR: CrCl cannot be calculated (Unknown ideal weight.).  Liver Function Tests: Recent Labs  Lab 06/23/20 1717  AST 26  ALT 12  ALKPHOS 87  BILITOT 1.1  PROT 6.7  ALBUMIN 3.4*    Urine analysis:    Component Value Date/Time   COLORURINE YELLOW 06/23/2020 1717   APPEARANCEUR CLEAR 06/23/2020 1717   LABSPEC 1.010 06/23/2020 1717   PHURINE 7.0 06/23/2020 1717   GLUCOSEU NEGATIVE 06/23/2020 1717   HGBUR SMALL (A) 06/23/2020 1717   BILIRUBINUR NEGATIVE 06/23/2020 1717   KETONESUR NEGATIVE 06/23/2020 1717    PROTEINUR NEGATIVE 06/23/2020 1717   UROBILINOGEN 0.2 05/23/2014 1548   NITRITE NEGATIVE 06/23/2020 1717   LEUKOCYTESUR NEGATIVE 06/23/2020 1717    Radiological Exams on Admission: CT Head Wo Contrast  Result Date: 06/23/2020 CLINICAL DATA:  Status post fall.  Altered mental status. EXAM: CT HEAD WITHOUT CONTRAST TECHNIQUE: Contiguous axial images were obtained from the base of  the skull through the vertex without intravenous contrast. COMPARISON:  Head CT 05/23/2014.  Brain MRI 06/12/2014. FINDINGS: Brain: No evidence of acute infarction, hemorrhage, hydrocephalus, extra-axial collection or mass lesion/mass effect. 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 Vascular: Atherosclerosis noted. Skull: Intact.  No focal lesion. Sinuses/Orbits: Orbits and paranasal sinuses are unremarkable. Chronic bilateral mastoid effusions are unchanged. Other: None. IMPRESSION: No acute abnormality. Chronic bilateral mastoid effusions. Electronically Signed   By: Inge Rise M.D.   On: 06/23/2020 18:17   DG Chest Portable 1 View  Result Date: 06/23/2020 CLINICAL DATA:  [place], altered level of consciousness EXAM: PORTABLE CHEST 1 VIEW COMPARISON:  None. FINDINGS: Single frontal view of the chest demonstrates an enlarged cardiac silhouette. There is ectasia of the thoracic aorta. There is background vascular congestion, with scattered linear opacities at the lung bases consistent with scarring or atelectasis. Eventration of the left hemidiaphragm. No airspace disease, effusion, or pneumothorax. IMPRESSION: 1. Vascular congestion without overt edema. 2. Scattered areas of scarring or subsegmental atelectasis. Electronically Signed   By: Randa Ngo M.D.   On: 06/23/2020 18:21    EKG: Independently reviewed. TWI in leads I and AvL.  Tachycardic, prolonged PR.  No sign of ST elevation.   Assessment/Plan COVID-19 Dyspnea - No sign of pneumonia on CXR - Requiring 2L of Oxygen, but does relatively well on RA as well - Tylenol for  fever/pain - Telemetry - Trend Troponin, initial was 18 - Remdesivir Day 1 of 5 today - Dexamethasone Day 1 of 10 today - Awaiting further inflammatory lab work which is pending - Blood culture X 2 - Procalcitonin pending - Given apparent dehydration, will give IVF at 75cc/hr for 10 hours, monitoring respiratory status.  She has chronic CHF as noted below.  - Airborne and contact precautions - Liquid diet - D dimer elevated, if not improving as expected consider CT of the chest - PT/OT when patient able    Hypertension - Continue home lisinopril-hctz and verapamil - Monitor for worsening BP    Alzheimer's disease ([place]) - Not currently on medication, requires only occasional redirection.     Chronic diastolic CHF (congestive heart failure) ([facility]) - Currently without overt volume overload.  She does have a mildly elevated BNP and chronic interstitial changes on CXR - Continue home torsemide and potassium - Monitor for changes - Oxygen as noted above    Chronic pain of both knees - Tylenol for pain    H/O Seizures and possible post-ictal state - Will order EEG for monitoring - Seizure precautions  DVT prophylaxis: Lovenox  Code Status:   Full, confirmed with family  Family Communication:  Daughter [name] in room  Disposition Plan:   Patient is from:  Home  Anticipated DC to:  Home  Anticipated DC date:  06/28/20  Anticipated DC barriers: Weakness, respiratory status  Consults called:  None Admission status:  INP, telemetry  Severity of Illness: The appropriate patient status for this patient is INPATIENT. Inpatient status is judged to be reasonable and necessary in order to provide the required intensity of service to ensure the patient's safety. The patient's presenting symptoms, physical exam findings, and initial radiographic and laboratory data in the context of their chronic comorbidities is felt to place them at high risk for further clinical deterioration. Furthermore,  it is not anticipated that the patient will be medically stable for discharge from the hospital within 2 midnights of admission. The following factors support the patient status of inpatient.   " The patient's presenting  symptoms include dyspnea, hypoxia, fever. " The worrisome physical exam findings include confusion, dyspnea. " The initial radiographic and laboratory data are worrisome because of increased inflammatory markers, COVID +. " The chronic co-morbidities include HFpEF, HTN, alzheimer's disease.   * I certify that at the point of admission it is my clinical judgment that the patient will require inpatient hospital care spanning beyond 2 midnights from the point of admission due to high intensity of service, high risk for further deterioration and high frequency of surveillance required.Gilles Chiquito MD [facility]  How to contact the [facility] Attending or Consulting provider [place] or covering provider during after hours [place], for this patient?   1. Check the care team in [facility] and look for a) attending/consulting [facility] provider listed and b) the [facility] team listed 2. Log into [email or web address] and use [place]'s universal password to access. If you do not have the password, please contact the hospital operator. 3. Locate the [facility] provider you are looking for under [facility] and page to a number that you can be directly reached. 4. If you still have difficulty reaching the provider, please page the [facility] (Director on Call) for the Hospitalists listed on amion for assistance.  06/23/2020, 11:04 PM

## 2020-06-23 NOTE — ED Notes (Signed)
Pt was assisted with myself and another RN to ambulate around the room. The pt and her family requested a walker and the pt was provided a walker. Pt struggled ambulating but her spo2 stayed within normal limits. Pt had to be given direction on ambulating multiple times. Pt's family was at bedside and was very concerned that her mother seemed "out of it," and not her normal.

## 2020-06-23 NOTE — ED Triage Notes (Signed)
Pt presents to ED via EMS from home after a fall with sob, AMS, twitching, fever, tachycardia, tachypneia. Covid exposure on Monday   CBG 131 150/80 Hr 110 rr 40 90% RA- 95%3lpm ETCO2 35-40 Sinus tach with pvc 20G LAC No meds pta

## 2020-06-23 NOTE — ED Provider Notes (Signed)
[facility] EMERGENCY DEPARTMENT Provider Note   CSN: [id number] Arrival date & time: 06/23/20  1656     History Chief Complaint  Patient presents with  . Altered Mental Status  . Shortness of Breath    Carol Martin is a 79 y.o. female.   Altered Mental Status Presenting symptoms: confusion and disorientation   Severity:  Severe Most recent episode:  Today Episode history:  Single Duration:  1 hour Timing:  Constant Progression:  Unchanged Chronicity:  New Associated symptoms: fever   Shortness of Breath Associated symptoms: fever       LEVEL 5 CAVEAT SECONDARY TO AMS Past Medical History:  Diagnosis Date  . Abnormal EKG   . Allergic rhinitis   . Alzheimer's disease ([place]) 05/17/07  . Arthritis of knee   . Breast cancer ([place])   . Diastolic dysfunction   . Dilated aortic root ([place])   . GERD (gastroesophageal reflux disease)   . Hypertension   . Kidney stones   . Mild aortic insufficiency   . Morbid obesity ([place])   . Sleep apnea    with insomnia  . Venous (peripheral) insufficiency     Patient Active Problem List   Diagnosis Date Noted  . Chronic pain of both knees 02/11/2018  . Bilateral hip pain 02/11/2018  . Unilateral primary osteoarthritis, left knee 02/11/2018  . Unilateral primary osteoarthritis, right knee 02/11/2018  . Tinea corporis 04/29/2016  . Cellulitis, abdominal wall 04/29/2016  . Dilated aortic root ([place])   . Chronic diastolic CHF (congestive heart failure) ([place])   . Mild aortic insufficiency   . Sleep apnea   . Breast cancer ([place]) 02/08/2013  . Abnormal EKG 05/18/2011  . Dyspnea 05/18/2011  . Hypertension 05/18/2011  . Healthcare maintenance 05/18/2011  . Alzheimer's disease ([place]) 05/17/2007    Past Surgical History:  Procedure Laterality Date  . CESAREAN SECTION    . DILATION AND CURETTAGE OF UTERUS    . ECTOPIC PREGNANCY SURGERY    . KIDNEY STONE SURGERY     REMOVAL  . MASTECTOMY     R breast   .  TRACHEOSTOMY       OB History   No obstetric history on file.     Family History  Problem Relation Age of Onset  . Hypertension Mother   . Hypertension Sister   . Cancer Sister        Unknown cancer    Social History   Tobacco Use  . Smoking status: Never Smoker  . Smokeless tobacco: Never Used  Vaping Use  . Vaping Use: Never used  Substance Use Topics  . Alcohol use: No  . Drug use: No    Home Medications Prior to Admission medications   Medication Sig Start Date End Date Taking? Authorizing Provider  acetaminophen (TYLENOL) 500 MG tablet Take 1,000 mg by mouth daily as needed for mild pain.    [provider]  aspirin 81 MG tablet Take 81 mg by mouth daily.    [provider]  cephALEXin (KEFLEX) 500 MG capsule Take 2 capsules (1,000 mg total) by mouth 3 (three) times daily. Patient not taking: Reported on 09/15/2019 05/05/18   Drenda Freeze, MD  lisinopril-hydrochlorothiazide (PRINZIDE,ZESTORETIC) 20-12.5 MG tablet Take 1 tablet by mouth daily.    [provider]  metroNIDAZOLE (FLAGYL) 500 MG tablet Take 1 tablet (500 mg total) by mouth 2 (two) times daily. 09/17/19   Shelly Bombard, MD  potassium chloride (K-DUR,KLOR-CON) 10  MEQ tablet Take 10 mEq by mouth daily.    [provider]  Potassium Chloride (KLOR-CON PO) Take by mouth.    [provider]  torsemide (DEMADEX) 20 MG tablet Take 20 mg by mouth daily.    [provider]  traMADol (ULTRAM) 50 MG tablet Take 1 tablet (50 mg total) by mouth every 6 (six) hours as needed. Patient not taking: Reported on 05/05/2018 08/09/17   Dorie Rank, MD  triamcinolone (KENALOG) 0.025 % cream Apply 1 application topically daily. 03/08/18   [provider]  verapamil (CALAN-SR) 180 MG CR tablet Take 180 mg by mouth daily.  08/10/17   [provider]    Allergies    Patient has no known allergies.  Review of Systems   Review of Systems  Unable to  perform ROS: Mental status change  Constitutional: Positive for fever.  Respiratory: Positive for shortness of breath.   Psychiatric/Behavioral: Positive for confusion.    Physical Exam Updated Vital Signs BP (!) 167/85 (BP Location: Right Arm)   Pulse (!) 102   Temp (!) 102.7 F (39.3 C) (Oral)   Resp (!) 24   SpO2 95%   Physical Exam Vitals and nursing note reviewed.  Constitutional:      General: She is not in acute distress.    Appearance: She is well-developed. She is obese.  HENT:     Head: Normocephalic and atraumatic.  Eyes:     Conjunctiva/sclera: Conjunctivae normal.  Cardiovascular:     Rate and Rhythm: Normal rate and regular rhythm.     Heart sounds: No murmur heard.   Pulmonary:     Effort: Pulmonary effort is normal. No respiratory distress.     Breath sounds: Normal breath sounds. No decreased breath sounds, wheezing, rhonchi or rales.  Chest:     Chest wall: No tenderness.  Abdominal:     Palpations: Abdomen is soft.     Tenderness: There is no abdominal tenderness.  Musculoskeletal:     Cervical back: Neck supple.     Right lower leg: Edema present.     Left lower leg: Edema present.     Comments: bl LE edema.  LLE slightly warm to touch.  Skin:    General: Skin is warm and dry.     Capillary Refill: Capillary refill takes less than 2 seconds.  Neurological:     Mental Status: She is alert.     Comments: Oriented to self only.     ED Results / Procedures / Treatments   Labs (all labs ordered are listed, but only abnormal results are displayed) Labs Reviewed  SARS CORONAVIRUS 2 BY RT PCR ([facility]) - Abnormal; Notable for the following components:      Result Value   SARS Coronavirus 2 POSITIVE (*)    All other components within normal limits  BRAIN NATRIURETIC PEPTIDE - Abnormal; Notable for the following components:   B Natriuretic Peptide 105.9 (*)    All other components within normal limits    COMPREHENSIVE METABOLIC PANEL - Abnormal; Notable for the following components:   Glucose, Bld 119 (*)    BUN 7 (*)    Albumin 3.4 (*)    All other components within normal limits  CBC WITH DIFFERENTIAL/PLATELET - Abnormal; Notable for the following components:   Lymphs Abs 0.3 (*)    All other components within normal limits  URINALYSIS, ROUTINE W REFLEX MICROSCOPIC - Abnormal; Notable for the following  components:   Hgb urine dipstick SMALL (*)    All other components within normal limits  TROPONIN I (HIGH SENSITIVITY) - Abnormal; Notable for the following components:   Troponin I (High Sensitivity) 18 (*)    All other components within normal limits  CULTURE, BLOOD (ROUTINE X 2)  CULTURE, BLOOD (ROUTINE X 2)  URINE CULTURE  LIPASE, BLOOD  LACTIC ACID, PLASMA  MAGNESIUM  AMMONIA  D-DIMER, QUANTITATIVE (NOT AT [facility])  PROCALCITONIN  LACTATE DEHYDROGENASE  FERRITIN  TRIGLYCERIDES  FIBRINOGEN  C-REACTIVE PROTEIN  CBG MONITORING, ED  TROPONIN I (HIGH SENSITIVITY)    EKG EKG Interpretation  Date/Time:  Saturday June 23 2020 17:35:11 EDT Ventricular Rate:  102 PR Interval:    QRS Duration: 95 QT Interval:  335 QTC Calculation: 437 R Axis:   -19 Text Interpretation: Sinus tachycardia Prolonged PR interval Abnormal R-wave progression, early transition LVH with secondary repolarization abnormality No significant change since last tracing Confirmed by Dorie Rank [phone]) on 06/23/2020 5:41:15 PM   Radiology CT Head Wo Contrast  Result Date: 06/23/2020 CLINICAL DATA:  Status post fall.  Altered mental status. EXAM: CT HEAD WITHOUT CONTRAST TECHNIQUE: Contiguous axial images were obtained from the base of the skull through the vertex without intravenous contrast. COMPARISON:  Head CT 05/23/2014.  Brain MRI 06/12/2014. FINDINGS: Brain: No evidence of acute infarction, hemorrhage, hydrocephalus, extra-axial collection or mass lesion/mass effect. 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  Vascular: Atherosclerosis noted. Skull: Intact.  No focal lesion. Sinuses/Orbits: Orbits and paranasal sinuses are unremarkable. Chronic bilateral mastoid effusions are unchanged. Other: None. IMPRESSION: No acute abnormality. Chronic bilateral mastoid effusions. Electronically Signed   By: Inge Rise M.D.   On: 06/23/2020 18:17   DG Chest Portable 1 View  Result Date: 06/23/2020 CLINICAL DATA:  [place], altered level of consciousness EXAM: PORTABLE CHEST 1 VIEW COMPARISON:  None. FINDINGS: Single frontal view of the chest demonstrates an enlarged cardiac silhouette. There is ectasia of the thoracic aorta. There is background vascular congestion, with scattered linear opacities at the lung bases consistent with scarring or atelectasis. Eventration of the left hemidiaphragm. No airspace disease, effusion, or pneumothorax. IMPRESSION: 1. Vascular congestion without overt edema. 2. Scattered areas of scarring or subsegmental atelectasis. Electronically Signed   By: Randa Ngo M.D.   On: 06/23/2020 18:21    Procedures Procedures (including critical care time)  Medications Ordered in ED Medications  dexamethasone (DECADRON) injection 6 mg (has no administration in time range)  remdesivir 200 mg in sodium chloride 0.9% 250 mL IVPB (200 mg Intravenous New Bag/Given 06/23/20 2129)    Followed by  remdesivir 100 mg in sodium chloride 0.9 % 100 mL IVPB (has no administration in time range)  acetaminophen (TYLENOL) suppository 650 mg (650 mg Rectal Given 06/23/20 1825)    ED Course  I have reviewed the triage vital signs and the nursing notes.  Pertinent labs & imaging results that were available during my care of the patient were reviewed by me and considered in my medical decision making (see chart for details).    MDM Rules/Calculators/A&P                          79 year old female with history of Alzheimer's disease, chronic diastolic heart failure presents with altered mental status and a  fall.  Patient was reportedly found by family minimally responsive on the floor by her bed.  Patient was reportedly normal prior to the fall and is normally alert  and oriented x4. Patient had a Covid exposure on Monday and patient's vaccination status is unclear.  Per EMS patient is oriented to self only and was requiring 3 L of oxygen to keep saturations above 85%.  Patient is not currently on oxygen.  Remainder vital signs any blood glucose normal per EMS.  Patient is not currently on blood thinners per EMS.  Temperature 102.7.  Patient slightly tachycardic in the 100s.  Remainder vital signs stable.  Patient is currently on 3 L of oxygen satting 100% and mildly tachypneic.  Patient is oriented to self only and is currently denying any symptoms though is unable to give a comprehensive history.  Patient has a large amount of lower extremity edema with mild warmth to the left lower extremity.  No other focal signs of infection on physical exam.  Will work patient up for possible sepsis as well as Covid and reevaluate the patient.  Patient's labs notable for positive Covid.  Normal lactate.  Chest x-ray showed scattered areas of scarring or subsegmental atelectasis with vascular congestion.  CT head without acute abnormality.  Spoke with patient's daughter who stated that patient reported that she was not feeling well earlier today.  Patient's daughter then put her to bed and then later found her on the ground.  Daughter states that she thought she saw some gentle twitching which patient may have when she has seizures.  Patient was previously being treated for seizures however recently she has been refusing to take her medication.  Patient was satting 100% on 3 L of oxygen so her oxygen was turned off and watched.  Patient did not do below 95% while on room air.  Patient was ambulated by nursing staff at the bedside however patient stated that she felt very weak and nursing staff had difficulty getting patient  out of the bed.  She maintained her oxygen during this time however patient's daughter states that this was not normal for her do not feel comfortable taking patient home with her weakness.  Given patient's new weakness in setting, diagnosis will admit patient.  Patient received Decadron as well as remdesivir and the hospitalist was consulted for admission.  Hospitalist evaluated the patient and agreed with admission to their service.  Patient was admitted to the hospital service in stable condition without further events.  No further events occurred during the duration of my shift.  Final Clinical Impression(s) / ED Diagnoses Final diagnoses:  [id number]    Rx / DC Orders ED Discharge Orders    None       Silvestre Gunner, MD 06/23/20 2241    Dorie Rank, MD 06/24/20 1725

## 2020-06-24 DIAGNOSIS — U071 COVID-19: Principal | ICD-10-CM

## 2020-06-24 DIAGNOSIS — F0280 Dementia in other diseases classified elsewhere without behavioral disturbance: Secondary | ICD-10-CM

## 2020-06-24 DIAGNOSIS — G301 Alzheimer's disease with late onset: Secondary | ICD-10-CM

## 2020-06-24 LAB — CBC WITH DIFFERENTIAL/PLATELET
Abs Immature Granulocytes: 0.04 10*3/uL (ref 0.00–0.07)
Basophils Absolute: 0 10*3/uL (ref 0.0–0.1)
Basophils Relative: 0 %
Eosinophils Absolute: 0 10*3/uL (ref 0.0–0.5)
Eosinophils Relative: 0 %
HCT: 43.4 % (ref 36.0–46.0)
Hemoglobin: 14.2 g/dL (ref 12.0–15.0)
Immature Granulocytes: 1 %
Lymphocytes Relative: 4 %
Lymphs Abs: 0.3 10*3/uL — ABNORMAL LOW (ref 0.7–4.0)
MCH: 31.4 pg (ref 26.0–34.0)
MCHC: 32.7 g/dL (ref 30.0–36.0)
MCV: 96 fL (ref 80.0–100.0)
Monocytes Absolute: 0.4 10*3/uL (ref 0.1–1.0)
Monocytes Relative: 6 %
Neutro Abs: 6.6 10*3/uL (ref 1.7–7.7)
Neutrophils Relative %: 89 %
Platelets: 162 10*3/uL (ref 150–400)
RBC: 4.52 MIL/uL (ref 3.87–5.11)
RDW: 13.2 % (ref 11.5–15.5)
WBC: 7.5 10*3/uL (ref 4.0–10.5)
nRBC: 0 % (ref 0.0–0.2)

## 2020-06-24 LAB — COMPREHENSIVE METABOLIC PANEL
ALT: 9 U/L (ref 0–44)
AST: 28 U/L (ref 15–41)
Albumin: 2.9 g/dL — ABNORMAL LOW (ref 3.5–5.0)
Alkaline Phosphatase: 82 U/L (ref 38–126)
Anion gap: 10 (ref 5–15)
BUN: 8 mg/dL (ref 8–23)
CO2: 25 mmol/L (ref 22–32)
Calcium: 8.6 mg/dL — ABNORMAL LOW (ref 8.9–10.3)
Chloride: 105 mmol/L (ref 98–111)
Creatinine, Ser: 0.7 mg/dL (ref 0.44–1.00)
GFR calc Af Amer: >60 mL/min (ref >60)
GFR calc non Af Amer: >60 mL/min (ref >60)
Glucose, Bld: 135 mg/dL — ABNORMAL HIGH (ref 70–99)
Potassium: 3.9 mmol/L (ref 3.5–5.1)
Sodium: 140 mmol/L (ref 135–145)
Total Bilirubin: 0.9 mg/dL (ref 0.3–1.2)
Total Protein: 6.2 g/dL — ABNORMAL LOW (ref 6.5–8.1)

## 2020-06-24 LAB — URINE CULTURE: Culture: NO GROWTH

## 2020-06-24 LAB — D-DIMER, QUANTITATIVE: D-Dimer, Quant: 1.44 ug/mL-FEU — ABNORMAL HIGH (ref 0.00–0.50)

## 2020-06-24 LAB — PROCALCITONIN: Procalcitonin: 0.35 ng/mL

## 2020-06-24 LAB — CBG MONITORING, ED
Glucose-Capillary: 124 mg/dL — ABNORMAL HIGH (ref 70–99)
Glucose-Capillary: 137 mg/dL — ABNORMAL HIGH (ref 70–99)
Glucose-Capillary: 89 mg/dL (ref 70–99)

## 2020-06-24 LAB — CREATININE, SERUM
Creatinine, Ser: 0.7 mg/dL (ref 0.44–1.00)
GFR calc Af Amer: >60 mL/min (ref >60)
GFR calc non Af Amer: >60 mL/min (ref >60)

## 2020-06-24 LAB — ABO/RH: ABO/RH(D): O POS

## 2020-06-24 LAB — FERRITIN: Ferritin: 85 ng/mL (ref 11–307)

## 2020-06-24 LAB — C-REACTIVE PROTEIN: CRP: 9.6 mg/dL — ABNORMAL HIGH (ref <1.0)

## 2020-06-24 NOTE — TOC Initial Note (Signed)
Transition of Care [facility]) - Initial/Assessment Note    Patient Details  Name: Carol Martin MRN: [id number] Date of Birth: 23-Dec-1940  [facility] CM/SW Contact:    [name], RN Phone Number: 06/24/2020, 2:46 PM  Clinical Narrative:                 Patient admitted with COVID pneumonia wi HPTN,  [place], known sick exposure Monday. Has had one dose of vaccine.  May need home oxygen. Nurse asked to do oxygen qualifications  To assess need/ lliter flow. CM will be following for additional needs  Expected Discharge Plan: [place] Barriers to Discharge: Continued Medical Work up   Patient Goals and CMS Choice        Expected Discharge Plan and Services Expected Discharge Plan: [place]                                              Prior Living Arrangements/Services     Patient language and need for interpreter reviewed:: Yes        Need for Family Participation in Patient Care: Yes (Comment) Care giver support system in place?: Yes (comment)   Criminal Activity/Legal Involvement Pertinent to Current Situation/Hospitalization: No - Comment as needed  Activities of Daily Living      Permission Sought/Granted Permission sought to share information with : Case Manager    Share Information with NAME: Daughter           Emotional Assessment         Alcohol / Substance Use: Not Applicable Psych Involvement: No (comment)  Admission diagnosis:  COVID-19 [U07.1] Patient Active Problem List   Diagnosis Date Noted  . COVID-19 06/23/2020  . Chronic pain of both knees 02/11/2018  . Bilateral hip pain 02/11/2018  . Unilateral primary osteoarthritis, left knee 02/11/2018  . Unilateral primary osteoarthritis, right knee 02/11/2018  . Tinea corporis 04/29/2016  . Cellulitis, abdominal wall 04/29/2016  . Dilated aortic root ([place])   . Chronic diastolic CHF (congestive heart failure) ([place])   .  Mild aortic insufficiency   . Sleep apnea   . Breast cancer ([place]) 02/08/2013  . Abnormal EKG 05/18/2011  . Dyspnea 05/18/2011  . Hypertension 05/18/2011  . Healthcare maintenance 05/18/2011  . Alzheimer's disease ([place]) 05/17/2007   PCP:  Nolene Ebbs, MD Pharmacy:   [facility] [place], [place] - [address] [place] [address] Phone: [phone] Fax: [phone]     Social Determinants of Health (SDOH) Interventions    Readmission Risk Interventions No flowsheet data found.

## 2020-06-24 NOTE — Progress Notes (Signed)
Discussed with patient's daughter, Ms. [place].  Covid: First dose of pfizer vaccine taken on 7/26, scheduled for second dose tomorrow.  Advised to finish it after patient becomes clinically stable and symptoms improved.  Seizure: Patient does have history of seizures.  She had been resistant to take any medications.  She was seen by neurologist in the past at [facility].  We will do EEG.  We will put her back on seizure medicine that she was prescribed in the past.  Family to call and find out the name of the medicine. She does have underlying dementia that makes seizure disorder likely.

## 2020-06-24 NOTE — ED Notes (Signed)
Pt placed on bedpan

## 2020-06-24 NOTE — Progress Notes (Signed)
PROGRESS NOTE    Carol Martin  DZH:[id number] DOB: Jun 24, 1941 DOA: 06/23/2020 PCP: Nolene Ebbs, MD    Brief Narrative:  79 year old female with history of hypertension, GERD, chronic diastolic heart failure, history of seizures not on medications, mild Alzheimer's disease and arthritis, history of breast cancer presented with encephalopathy.  Patient has exposure to her sick daughter with COVID-19 last Monday.  She was found fallen from the bed and confused. In the emergency room, she was febrile and mildly tachycardic.Elevated inflammatory markers.  COVID-19 positive.   Assessment & Plan:   Active Problems:   Dyspnea   Hypertension   Alzheimer's disease (HCC)   Chronic diastolic CHF (congestive heart failure) (HCC)   Chronic pain of both knees   COVID-19  COVID-19 viral infection with hypoxia: Continue to monitor due to significant symptoms , oxygenation is better today. chest physiotherapy, incentive spirometry, deep breathing exercises, sputum induction, mucolytic's and bronchodilators. Supplemental oxygen to keep saturations more than 90%. Covid directed therapy with , steroids, dexamethasone remdesivir, day 2/5 antibiotics, not indicated. Procalcitonin 0.35, will not start on antibiotics.  Blood cultures were drawn.  Urinalysis was normal. Due to severity of symptoms, patient will need daily inflammatory markers, chest x-rays, liver function test to monitor and direct COVID-19 therapies.  COVID-19 Labs  Recent Labs    06/23/20 2219 06/24/20 0450  DDIMER 1.63* 1.44*  FERRITIN 66 85  LDH 177  --   CRP 4.3* 9.6*    Lab Results  Component Value Date   SARSCOV2NAA POSITIVE (A) 06/23/2020   SpO2: 96 % O2 Flow Rate (L/min): 3 L/min  Hypertension: Blood pressure stable on lisinopril hydrochlorothiazide and verapamil.  Continue.  Chronic diastolic congestive heart failure: Euvolemic.  On torsemide, hydrochlorothiazide and potassium that is continued.  History  of seizure: Seizure precautions.  Not on treatment.  Questionable history of seizure, however very infrequent seizure not demanding treatment.  If she has recurrence, will treat.  Mobilize.  Work with PT OT when able.  DVT prophylaxis: enoxaparin (LOVENOX) injection 40 mg Start: 06/23/20 2330   Code Status: Full code Family Communication: Called patient's daughter, unable to talk Disposition Plan: Status is: Inpatient  Remains inpatient appropriate because:IV treatments appropriate due to intensity of illness or inability to take PO and Inpatient level of care appropriate due to severity of illness   Dispo: The patient is from: Home              Anticipated d/c is to: Home              Anticipated d/c date is: 2 days              Patient currently is not medically stable to d/c.         Consultants:   None  Procedures:   EEG, pending  Antimicrobials:  Antibiotics Given (last 72 hours)    Date/Time Action Medication Dose Rate   06/23/20 2129 New Bag/Given   remdesivir 200 mg in sodium chloride 0.9% 250 mL IVPB 200 mg 580 mL/hr   06/24/20 1028 New Bag/Given   remdesivir 100 mg in sodium chloride 0.9 % 100 mL IVPB 100 mg 200 mL/hr         Subjective: Patient seen and examined.  In the emergency room waiting for inpatient bed assignment.  Mostly on room air.  She is poor historian.  She just tells me that she cannot hear and whether I can fix it.  She does not know why she  is here.  She tells me to ask her daughter about everything.  Objective: Vitals:   06/24/20 0715 06/24/20 0730 06/24/20 1100 06/24/20 1115  BP: 132/85 (!) 146/84 (!) 142/79 128/81  Pulse: 81 82 74 72  Resp: 17 18 (!) 25 15  Temp:  98.6 F (37 C)    TempSrc:  Oral    SpO2: 97% 97% 100% 96%    Intake/Output Summary (Last 24 hours) at 06/24/2020 1151 Last data filed at 06/24/2020 1144 Gross per 24 hour  Intake 250 ml  Output 900 ml  Net -650 ml   There were no vitals filed for this  visit.  Examination:  General exam: Appears calm and comfortable  Slightly anxious.  Alert oriented x1.  Pleasantly confused.  Not in any distress. Respiratory system: Clear to auscultation. Respiratory effort normal. Cardiovascular system: S1 & S2 heard, RRR. No JVD, murmurs, rubs, gallops or clicks. No pedal edema. Gastrointestinal system: Abdomen is nondistended, soft and nontender. No organomegaly or masses felt. Normal bowel sounds heard. Extremities: Symmetric 5 x 5 power.     Data Reviewed: I have personally reviewed following labs and imaging studies  CBC: Recent Labs  Lab 06/23/20 1717 06/24/20 0450  WBC 7.9 7.5  NEUTROABS 6.8 6.6  HGB 14.2 14.2  HCT 44.2 43.4  MCV 95.5 96.0  PLT 187 465   Basic Metabolic Panel: Recent Labs  Lab 06/23/20 1717 06/24/20 0114 06/24/20 0450  NA 140  --  140  K 3.6  --  3.9  CL 103  --  105  CO2 24  --  25  GLUCOSE 119*  --  135*  BUN 7*  --  8  CREATININE 0.84 0.70 0.70  CALCIUM 8.9  --  8.6*  MG 1.9  --   --    GFR: CrCl cannot be calculated (Unknown ideal weight.). Liver Function Tests: Recent Labs  Lab 06/23/20 1717 06/24/20 0450  AST 26 28  ALT 12 9  ALKPHOS 87 82  BILITOT 1.1 0.9  PROT 6.7 6.2*  ALBUMIN 3.4* 2.9*   Recent Labs  Lab 06/23/20 1717  LIPASE 19   Recent Labs  Lab 06/23/20 1732  AMMONIA 11   Coagulation Profile: No results for input(s): INR, PROTIME in the last 168 hours. Cardiac Enzymes: No results for input(s): CKTOTAL, CKMB, CKMBINDEX, TROPONINI in the last 168 hours. BNP (last 3 results) No results for input(s): PROBNP in the last 8760 hours. HbA1C: No results for input(s): HGBA1C in the last 72 hours. CBG: Recent Labs  Lab 06/23/20 1700 06/24/20 0802  GLUCAP 89 124*   Lipid Profile: Recent Labs    06/23/20 2219  TRIG 34   Thyroid Function Tests: No results for input(s): TSH, T4TOTAL, FREET4, T3FREE, THYROIDAB in the last 72 hours. Anemia Panel: Recent Labs     06/23/20 2219 06/24/20 0450  FERRITIN 66 85   Sepsis Labs: Recent Labs  Lab 06/23/20 1717 06/23/20 2219  PROCALCITON  --  0.35  LATICACIDVEN 1.4  --     Recent Results (from the past 240 hour(s))  SARS Coronavirus 2 by RT PCR (hospital order, performed in [facility] hospital lab) Nasopharyngeal Nasopharyngeal Swab     Status: Abnormal   Collection Time: 06/23/20  5:07 PM   Specimen: Nasopharyngeal Swab  Result Value Ref Range Status   SARS Coronavirus 2 POSITIVE (A) NEGATIVE Final    Comment: RESULT CALLED TO, READ BACK BY AND VERIFIED WITH: RN Lelon Huh [phone] FCP (NOTE)  SARS-CoV-2 target nucleic acids are DETECTED  SARS-CoV-2 RNA is generally detectable in upper respiratory specimens  during the acute phase of infection.  Positive results are indicative  of the presence of the identified virus, but do not rule out bacterial infection or co-infection with other pathogens not detected by the test.  Clinical correlation with patient history and  other diagnostic information is necessary to determine patient infection status.  The expected result is negative.  Fact Sheet for Patients:   StrictlyIdeas.no   Fact Sheet for Healthcare Providers:   BankingDealers.co.za    This test is not yet approved or cleared by the [place] FDA and  has been authorized for detection and/or diagnosis of SARS-CoV-2 by FDA under an Emergency Use Authorization (EUA).  This EUA will remain in effect (meaning this test can b e used) for the duration of  the COVID-19 declaration under Section 564(b)(1) of the Act, 21 U.S.C. section 360-bbb-3(b)(1), unless the authorization is terminated or revoked sooner.  Performed at [facility], [place] [address], [place], [place] [address]   Blood culture (routine x 2)     Status: None (Preliminary result)   Collection Time: 06/23/20  5:18 PM   Specimen: BLOOD  Result Value Ref Range Status    Specimen Description BLOOD RIGHT ANTECUBITAL  Final   Special Requests   Final    BOTTLES DRAWN AEROBIC AND ANAEROBIC Blood Culture adequate volume   Culture   Final    NO GROWTH < 24 HOURS Performed at [facility], [place] [address], [place], [place] [address]    Report Status PENDING  Incomplete  Blood culture (routine x 2)     Status: None (Preliminary result)   Collection Time: 06/23/20  5:19 PM   Specimen: BLOOD  Result Value Ref Range Status   Specimen Description BLOOD SITE NOT SPECIFIED  Final   Special Requests   Final    BOTTLES DRAWN AEROBIC AND ANAEROBIC Blood Culture adequate volume   Culture   Final    NO GROWTH < 24 HOURS Performed at [facility], [place] [address] [place], [place] [address]    Report Status PENDING  Incomplete         Radiology Studies: CT Head Wo Contrast  Result Date: 06/23/2020 CLINICAL DATA:  Status post fall.  Altered mental status. EXAM: CT HEAD WITHOUT CONTRAST TECHNIQUE: Contiguous axial images were obtained from the base of the skull through the vertex without intravenous contrast. COMPARISON:  Head CT 05/23/2014.  Brain MRI 06/12/2014. FINDINGS: Brain: No evidence of acute infarction, hemorrhage, hydrocephalus, extra-axial collection or mass lesion/mass effect. 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 Vascular: Atherosclerosis noted. Skull: Intact.  No focal lesion. Sinuses/Orbits: Orbits and paranasal sinuses are unremarkable. Chronic bilateral mastoid effusions are unchanged. Other: None. IMPRESSION: No acute abnormality. Chronic bilateral mastoid effusions. Electronically Signed   By: Inge Rise M.D.   On: 06/23/2020 18:17   DG Chest Portable 1 View  Result Date: 06/23/2020 CLINICAL DATA:  [place], altered level of consciousness EXAM: PORTABLE CHEST 1 VIEW COMPARISON:  None. FINDINGS: Single frontal view of the chest demonstrates an enlarged cardiac silhouette. There is ectasia of the thoracic aorta. There is background vascular congestion,  with scattered linear opacities at the lung bases consistent with scarring or atelectasis. Eventration of the left hemidiaphragm. No airspace disease, effusion, or pneumothorax. IMPRESSION: 1. Vascular congestion without overt edema. 2. Scattered areas of scarring or subsegmental atelectasis. Electronically Signed   By: Legrand Como  Owens Shark M.D.   On: 06/23/2020 18:21        Scheduled Meds:  albuterol  2 puff Inhalation Q6H   dexamethasone (DECADRON) injection  6 mg Intravenous Q24H   enoxaparin (LOVENOX) injection  40 mg Subcutaneous QHS   lisinopril  20 mg Oral Daily   And   hydrochlorothiazide  12.5 mg Oral Daily   insulin aspart  0-15 Units Subcutaneous TID WC   potassium chloride  10 mEq Oral Daily   sodium chloride flush  3 mL Intravenous Q12H   torsemide  20 mg Oral Daily   verapamil  180 mg Oral Daily   Continuous Infusions:  remdesivir 100 mg in NS 100 mL Stopped (06/24/20 1058)     LOS: 1 day    Time spent: 30 minutes    Barb Merino, MD [facility] Pager [phone]

## 2020-06-25 ENCOUNTER — Inpatient Hospital Stay (HOSPITAL_COMMUNITY): Payer: Medicare Other

## 2020-06-25 DIAGNOSIS — R4182 Altered mental status, unspecified: Secondary | ICD-10-CM

## 2020-06-25 DIAGNOSIS — R569 Unspecified convulsions: Secondary | ICD-10-CM

## 2020-06-25 LAB — COMPREHENSIVE METABOLIC PANEL
ALT: 15 U/L (ref 0–44)
AST: 47 U/L — ABNORMAL HIGH (ref 15–41)
Albumin: 2.8 g/dL — ABNORMAL LOW (ref 3.5–5.0)
Alkaline Phosphatase: 67 U/L (ref 38–126)
Anion gap: 11 (ref 5–15)
BUN: 12 mg/dL (ref 8–23)
CO2: 24 mmol/L (ref 22–32)
Calcium: 8.4 mg/dL — ABNORMAL LOW (ref 8.9–10.3)
Chloride: 103 mmol/L (ref 98–111)
Creatinine, Ser: 0.68 mg/dL (ref 0.44–1.00)
GFR calc Af Amer: >60 mL/min (ref >60)
GFR calc non Af Amer: >60 mL/min (ref >60)
Glucose, Bld: 143 mg/dL — ABNORMAL HIGH (ref 70–99)
Potassium: 3.7 mmol/L (ref 3.5–5.1)
Sodium: 138 mmol/L (ref 135–145)
Total Bilirubin: 0.8 mg/dL (ref 0.3–1.2)
Total Protein: 5.9 g/dL — ABNORMAL LOW (ref 6.5–8.1)

## 2020-06-25 LAB — CBC WITH DIFFERENTIAL/PLATELET
Abs Immature Granulocytes: 0.01 10*3/uL (ref 0.00–0.07)
Basophils Absolute: 0 10*3/uL (ref 0.0–0.1)
Basophils Relative: 0 %
Eosinophils Absolute: 0 10*3/uL (ref 0.0–0.5)
Eosinophils Relative: 0 %
HCT: 42.6 % (ref 36.0–46.0)
Hemoglobin: 13.5 g/dL (ref 12.0–15.0)
Immature Granulocytes: 0 %
Lymphocytes Relative: 15 %
Lymphs Abs: 0.6 10*3/uL — ABNORMAL LOW (ref 0.7–4.0)
MCH: 30.3 pg (ref 26.0–34.0)
MCHC: 31.7 g/dL (ref 30.0–36.0)
MCV: 95.5 fL (ref 80.0–100.0)
Monocytes Absolute: 0.3 10*3/uL (ref 0.1–1.0)
Monocytes Relative: 7 %
Neutro Abs: 3.2 10*3/uL (ref 1.7–7.7)
Neutrophils Relative %: 78 %
Platelets: 152 10*3/uL (ref 150–400)
RBC: 4.46 MIL/uL (ref 3.87–5.11)
RDW: 12.9 % (ref 11.5–15.5)
WBC: 4.2 10*3/uL (ref 4.0–10.5)
nRBC: 0 % (ref 0.0–0.2)

## 2020-06-25 LAB — CBG MONITORING, ED
Glucose-Capillary: 146 mg/dL — ABNORMAL HIGH (ref 70–99)
Glucose-Capillary: 128 mg/dL — ABNORMAL HIGH (ref 70–99)
Glucose-Capillary: 95 mg/dL (ref 70–99)
Glucose-Capillary: 124 mg/dL — ABNORMAL HIGH (ref 70–99)

## 2020-06-25 LAB — C-REACTIVE PROTEIN: CRP: 8.3 mg/dL — ABNORMAL HIGH (ref <1.0)

## 2020-06-25 LAB — D-DIMER, QUANTITATIVE: D-Dimer, Quant: 1.25 ug/mL-FEU — ABNORMAL HIGH (ref 0.00–0.50)

## 2020-06-25 LAB — FERRITIN: Ferritin: 138 ng/mL (ref 11–307)

## 2020-06-25 NOTE — ED Notes (Signed)
Accucheck not crossing over- IT notified, checked pt bgl at [place] [address], 2 units insulin given subq per [facility]

## 2020-06-25 NOTE — Progress Notes (Signed)
Occupational Therapy Evaluation  PTA, pt lived with daughter who assisted with LB ADL. Pt able to ambulate with use of rollator. Unablet o safely mobilize off stretcher with +1 at this time. Pt would most likely do better with mobility from regular hospital bed. Pt complaining of L knee pain which is also limiting mobility. Noted Moisture associated skin damage; macerated with breakdown noted under pannus - recommend keeping area dry and consulting Wound care Nsg for use of Interdry to help wick away moisture and improve skin integrity - nsg notified. At this time will follow acutely to work toward pt's goal of going home, however if pt does not progress, will need rehab at [facility].    06/25/20 1800  OT Visit Information  Assistance Needed +2  History of Present Illness Pt is a 79 y/o female admitted secondary to fall and COVID. Pt was placed on 2L secondary to desaturation upon admission. PMH includes breast CA, dementia, HTN, and dCHF.   Precautions  Precautions Fall  Home Living  Family/patient expects to be discharged to: Private residence  Living Arrangements Children  Available Help at Discharge Available 24 hours/day;Family  Type of Home Apartment  Home Access Level entry  [place] One level  Bathroom Shower/Tub Biochemist, clinical Yes  How Accessible Accessible via Paramedic - 4 wheels  Prior Function  Level of [place] Needs assistance  Gait / Transfers Assistance Needed USed rollator for ambulation  ADL's / [place] Daughter assists with bathing  Communication  Communication HOH  Pain Assessment  Pain Assessment Faces  Faces Pain Scale 6  Pain Location L knee with movement   Pain Descriptors / Indicators Aching;Grimacing;Guarding  Pain Intervention(s) Limited activity within patient's tolerance  Cognition  Arousal/Alertness Awake/alert  Behavior During Therapy WFL for  tasks assessed/performed  Overall Cognitive Status No family/caregiver present to determine baseline cognitive functioning  General Comments Dementia at baseline. Noted slow processing  Upper Extremity Assessment  Upper Extremity Assessment Generalized weakness  Lower Extremity Assessment  Lower Extremity Assessment Defer to PT evaluation  LLE Deficits / Details Reports increased knee pain and had limited ROM  Cervical / Trunk Assessment  Cervical / Trunk Assessment Normal (increased body habitus)  ADL  Overall ADL's  Needs assistance/impaired  Grooming Set up;Sitting  Upper Body Bathing Set up;Sitting  Lower Body Bathing Moderate assistance;Sitting/lateral leans  Upper Body Dressing  Minimal assistance;Sitting  Lower Body Dressing Maximal assistance;Sitting/lateral leans  Toileting- Clothing Manipulation and Hygiene Total assistance (incontinent)  Functional mobility during ADLs +2 for physical assistance  General ADL Comments daughter has to assist wtih sock/shoes due to being unable to reach  Bed Mobility  Rolling Min assist (rolling for pericare adn linen change)  Transfers  General transfer comment mod-max A +2 per PT note; unable this session due to being unsafe with 1 person off stretcher  Balance  Overall balance assessment Needs assistance  Sitting-balance support No upper extremity supported;Feet supported  Sitting balance-Leahy Scale Fair  General Comments  General comments (skin integrity, edema, etc.) desat with exertion into upper 80s on RA  Exercises  Exercises Other exercises  Other Exercises  Other Exercises Began education on level 1 theraband exericses  OT - End of Session  Activity Tolerance Patient tolerated treatment well  Patient left in bed;with call bell/phone within reach  Nurse Communication Mobility status;Other (comment) (MASD; use of Interdry)  OT Assessment  OT Recommendation/Assessment Patient needs continued OT  Services  OT Visit Diagnosis  Other abnormalities of gait and mobility (R26.89);Muscle weakness (generalized) (M62.81);Other symptoms and signs involving cognitive function;Pain  Pain - Right/Left Left  Pain - part of body Knee  OT Problem List Decreased strength;Decreased activity tolerance;Impaired balance (sitting and/or standing);Decreased safety awareness;Decreased knowledge of use of DME or AE;Cardiopulmonary status limiting activity;Obesity;Pain  OT Plan  OT Frequency (ACUTE ONLY) Min 2X/week  OT Treatment/Interventions (ACUTE ONLY) Self-care/ADL training;Therapeutic exercise;Neuromuscular education;Energy conservation;DME and/or AE instruction;Therapeutic activities;Cognitive remediation/compensation;Patient/family education;Balance training  AM-PAC OT "6 Clicks" Daily Activity Outcome Measure (Version 2)  Help from another person eating meals? 4  Help from another person taking care of personal grooming? 3  Help from another person toileting, which includes using toliet, bedpan, or urinal? 1  Help from another person bathing (including washing, rinsing, drying)? 2  Help from another person to put on and taking off regular upper body clothing? 3  Help from another person to put on and taking off regular lower body clothing? 1  6 Click Score 14  OT Recommendation  Recommendations for Other Services Other (comment) (wound care consult for MASD under pannus)  Follow Up Recommendations SNF;Supervision/Assistance - 24 hour  OT Equipment 3 in 1 bedside commode;Tub/shower bench (bariactric)  Individuals Consulted  Consulted and Agree with Results and Recommendations Patient  Acute Rehab OT Goals  Patient Stated Goal to get stronger  OT Goal Formulation With patient  Time For Goal Achievement 07/09/20  Potential to Achieve Goals Good  OT Time Calculation  OT Start Time (ACUTE ONLY) 1733  OT Stop Time (ACUTE ONLY) 1803  OT Time Calculation (min) 30 min  OT General Charges  $OT Visit 1 Visit  OT Evaluation  $OT  Eval Moderate Complexity 1 Mod  OT Treatments  $Self Care/Home Management  8-22 mins  Maurie Boettcher, OT/L   Acute OT Clinical Specialist [place] Pager [phone] Office [phone]

## 2020-06-25 NOTE — Progress Notes (Signed)
PROGRESS NOTE    Carol Martin  FBP:[id number] DOB: [birth year] DOA: 06/23/2020 PCP: Nolene Ebbs, MD    Brief Narrative:  79 year old female with history of hypertension, GERD, chronic diastolic heart failure, history of seizures not on medications, mild Alzheimer's disease and arthritis, history of breast cancer presented with encephalopathy.  Patient has exposure to her sick daughter with COVID-19 last Monday.  She was found fallen from the bed and confused. In the emergency room, she was febrile and mildly tachycardic.Elevated inflammatory markers.  COVID-19 positive.   Assessment & Plan:   Active Problems:   Dyspnea   Hypertension   Alzheimer's disease (HCC)   Chronic diastolic CHF (congestive heart failure) (HCC)   Chronic pain of both knees   COVID-19  COVID-19 viral infection with hypoxia: We will try to wean off the oxygen.  Currently on 2 L. chest physiotherapy, incentive spirometry, deep breathing exercises, sputum induction, mucolytic's and bronchodilators. Supplemental oxygen to keep saturations more than 90%. Covid directed therapy with , steroids, dexamethasone remdesivir, day 3/5 antibiotics, not indicated. Procalcitonin 0.35, no indication for antibiotics.  Blood cultures were drawn.  Urinalysis was normal. Due to severity of symptoms, patient will need daily inflammatory markers, chest x-rays, liver function test to monitor and direct COVID-19 therapies.  COVID-19 Labs  Recent Labs    06/23/20 2219 06/24/20 0450 06/25/20 0611  DDIMER 1.63* 1.44* 1.25*  FERRITIN 66 85 138  LDH 177  --   --   CRP 4.3* 9.6* 8.3*    Lab Results  Component Value Date   SARSCOV2NAA POSITIVE (A) 06/23/2020   SpO2: 92 % O2 Flow Rate (L/min): 2 L/min  Hypertension: Blood pressure stable on lisinopril hydrochlorothiazide and verapamil.  Continue.  Chronic diastolic congestive heart failure: Euvolemic.  On torsemide, hydrochlorothiazide and potassium that is  continued.  History of seizure: Seizure precautions.  Not on treatment.  Probably has recurrent seizure. Previously resistant to treatment.  Now she is okay to start treatment. Discussed with daughter who will touch base with her neurologist, will get records and start on treatment. We will refer her to follow-up with a neurologist.  Mobilize.  Work with PT OT when able.  DVT prophylaxis: enoxaparin (LOVENOX) injection 40 mg Start: 06/23/20 2330   Code Status: Full code Family Communication: Called patient's daughter, Ms. Carol Martin on the phone. Disposition Plan: Status is: Inpatient  Remains inpatient appropriate because:IV treatments appropriate due to intensity of illness or inability to take PO and Inpatient level of care appropriate due to severity of illness   Dispo: The patient is from: Home              Anticipated d/c is to: Home              Anticipated d/c date is: 2 days, anticipate tomorrow.              Patient currently is not medically stable to d/c.         Consultants:   None  Procedures:   EEG, pending  Antimicrobials:  Antibiotics Given (last 72 hours)    Date/Time Action Medication Dose Rate   06/23/20 2129 New Bag/Given   remdesivir 200 mg in sodium chloride 0.9% 250 mL IVPB 200 mg 580 mL/hr   06/24/20 1028 New Bag/Given   remdesivir 100 mg in sodium chloride 0.9 % 100 mL IVPB 100 mg 200 mL/hr   06/25/20 1011 New Bag/Given   remdesivir 100 mg in sodium chloride 0.9 % 100 mL IVPB  100 mg 200 mL/hr         Subjective: Seen and examined.  Poor historian.  She denies any complaints today.  Has some dry cough.  Today she was able to hear better.  Objective: Vitals:   06/25/20 1000 06/25/20 1115 06/25/20 1130 06/25/20 1145  BP: (!) 154/101     Pulse: 75 64 78 66  Resp: (!) 21 19 15 17   Temp:      TempSrc:      SpO2: 91% 95% 94% 92%    Intake/Output Summary (Last 24 hours) at 06/25/2020 1407 Last data filed at 06/24/2020 1652 Gross per 24  hour  Intake --  Output 1000 ml  Net -1000 ml   There were no vitals filed for this visit.  Examination:  General exam: Appears calm and comfortable  Slightly anxious.  Alert oriented x 1-2.  Pleasantly confused.  Not in any distress. Respiratory system: Clear to auscultation. Respiratory effort normal. Cardiovascular system: S1 & S2 heard, RRR. No JVD, murmurs, rubs, gallops or clicks. No pedal edema. Gastrointestinal system: Abdomen is nondistended, soft and nontender. No organomegaly or masses felt. Normal bowel sounds heard. Extremities: Symmetric 5 x 5 power.     Data Reviewed: I have personally reviewed following labs and imaging studies  CBC: Recent Labs  Lab 06/23/20 1717 06/24/20 0450 06/25/20 0611  WBC 7.9 7.5 4.2  NEUTROABS 6.8 6.6 3.2  HGB 14.2 14.2 13.5  HCT 44.2 43.4 42.6  MCV 95.5 96.0 95.5  PLT 187 162 425   Basic Metabolic Panel: Recent Labs  Lab 06/23/20 1717 06/24/20 0114 06/24/20 0450 06/25/20 0611  NA 140  --  140 138  K 3.6  --  3.9 3.7  CL 103  --  105 103  CO2 24  --  25 24  GLUCOSE 119*  --  135* 143*  BUN 7*  --  8 12  CREATININE 0.84 0.70 0.70 0.68  CALCIUM 8.9  --  8.6* 8.4*  MG 1.9  --   --   --    GFR: CrCl cannot be calculated (Unknown ideal weight.). Liver Function Tests: Recent Labs  Lab 06/23/20 1717 06/24/20 0450 06/25/20 0611  AST 26 28 47*  ALT 12 9 15   ALKPHOS 87 82 67  BILITOT 1.1 0.9 0.8  PROT 6.7 6.2* 5.9*  ALBUMIN 3.4* 2.9* 2.8*   Recent Labs  Lab 06/23/20 1717  LIPASE 19   Recent Labs  Lab 06/23/20 1732  AMMONIA 11   Coagulation Profile: No results for input(s): INR, PROTIME in the last 168 hours. Cardiac Enzymes: No results for input(s): CKTOTAL, CKMB, CKMBINDEX, TROPONINI in the last 168 hours. BNP (last 3 results) No results for input(s): PROBNP in the last 8760 hours. HbA1C: No results for input(s): HGBA1C in the last 72 hours. CBG: Recent Labs  Lab 06/24/20 0802 06/24/20 1216  06/24/20 1727 06/25/20 0011 06/25/20 0828  GLUCAP 124* 137* 89 128* 146*   Lipid Profile: Recent Labs    06/23/20 2219  TRIG 34   Thyroid Function Tests: No results for input(s): TSH, T4TOTAL, FREET4, T3FREE, THYROIDAB in the last 72 hours. Anemia Panel: Recent Labs    06/24/20 0450 06/25/20 0611  FERRITIN 85 138   Sepsis Labs: Recent Labs  Lab 06/23/20 1717 06/23/20 2219  PROCALCITON  --  0.35  LATICACIDVEN 1.4  --     Recent Results (from the past 240 hour(s))  Urine culture     Status: None   Collection  Time: 06/23/20  5:05 PM   Specimen: Urine, Catheterized  Result Value Ref Range Status   Specimen Description URINE, CATHETERIZED  Final   Special Requests NONE  Final   Culture   Final    NO GROWTH Performed at [facility], [address] [place], [place] [address]    Report Status 06/24/2020 FINAL  Final  SARS Coronavirus 2 by RT PCR (hospital order, performed in [facility] hospital lab) Nasopharyngeal Nasopharyngeal Swab     Status: Abnormal   Collection Time: 06/23/20  5:07 PM   Specimen: Nasopharyngeal Swab  Result Value Ref Range Status   SARS Coronavirus 2 POSITIVE (A) NEGATIVE Final    Comment: RESULT CALLED TO, READ BACK BY AND VERIFIED WITH: RN Lelon Huh [phone] FCP (NOTE) SARS-CoV-2 target nucleic acids are DETECTED  SARS-CoV-2 RNA is generally detectable in upper respiratory specimens  during the acute phase of infection.  Positive results are indicative  of the presence of the identified virus, but do not rule out bacterial infection or co-infection with other pathogens not detected by the test.  Clinical correlation with patient history and  other diagnostic information is necessary to determine patient infection status.  The expected result is negative.  Fact Sheet for Patients:   StrictlyIdeas.no   Fact Sheet for Healthcare Providers:   BankingDealers.co.za    This test is not  yet approved or cleared by the [place] FDA and  has been authorized for detection and/or diagnosis of SARS-CoV-2 by FDA under an Emergency Use Authorization (EUA).  This EUA will remain in effect (meaning this test can b e used) for the duration of  the COVID-19 declaration under Section 564(b)(1) of the Act, 21 U.S.C. section 360-bbb-3(b)(1), unless the authorization is terminated or revoked sooner.  Performed at [facility], [place] [address], [place], [place] [address]   Blood culture (routine x 2)     Status: None (Preliminary result)   Collection Time: 06/23/20  5:18 PM   Specimen: BLOOD  Result Value Ref Range Status   Specimen Description BLOOD RIGHT ANTECUBITAL  Final   Special Requests   Final    BOTTLES DRAWN AEROBIC AND ANAEROBIC Blood Culture adequate volume   Culture   Final    NO GROWTH 2 DAYS Performed at [facility], [place] [address] [place], [place] [address]    Report Status PENDING  Incomplete  Blood culture (routine x 2)     Status: None (Preliminary result)   Collection Time: 06/23/20  5:19 PM   Specimen: BLOOD  Result Value Ref Range Status   Specimen Description BLOOD SITE NOT SPECIFIED  Final   Special Requests   Final    BOTTLES DRAWN AEROBIC AND ANAEROBIC Blood Culture adequate volume   Culture   Final    NO GROWTH 2 DAYS Performed at [facility], [place] [address], [place], [place] [address]    Report Status PENDING  Incomplete         Radiology Studies: CT Head Wo Contrast  Result Date: 06/23/2020 CLINICAL DATA:  Status post fall.  Altered mental status. EXAM: CT HEAD WITHOUT CONTRAST TECHNIQUE: Contiguous axial images were obtained from the base of the skull through the vertex without intravenous contrast. COMPARISON:  Head CT 05/23/2014.  Brain MRI 06/12/2014. FINDINGS: Brain: No evidence of acute infarction, hemorrhage, hydrocephalus, extra-axial collection or mass lesion/mass effect. 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 Vascular:  Atherosclerosis noted. Skull: Intact.  No focal lesion.  Sinuses/Orbits: Orbits and paranasal sinuses are unremarkable. Chronic bilateral mastoid effusions are unchanged. Other: None. IMPRESSION: No acute abnormality. Chronic bilateral mastoid effusions. Electronically Signed   By: Inge Rise M.D.   On: 06/23/2020 18:17   DG Chest Portable 1 View  Result Date: 06/23/2020 CLINICAL DATA:  [place], altered level of consciousness EXAM: PORTABLE CHEST 1 VIEW COMPARISON:  None. FINDINGS: Single frontal view of the chest demonstrates an enlarged cardiac silhouette. There is ectasia of the thoracic aorta. There is background vascular congestion, with scattered linear opacities at the lung bases consistent with scarring or atelectasis. Eventration of the left hemidiaphragm. No airspace disease, effusion, or pneumothorax. IMPRESSION: 1. Vascular congestion without overt edema. 2. Scattered areas of scarring or subsegmental atelectasis. Electronically Signed   By: Randa Ngo M.D.   On: 06/23/2020 18:21        Scheduled Meds: . albuterol  2 puff Inhalation Q6H  . dexamethasone (DECADRON) injection  6 mg Intravenous Q24H  . enoxaparin (LOVENOX) injection  40 mg Subcutaneous QHS  . lisinopril  20 mg Oral Daily   And  . hydrochlorothiazide  12.5 mg Oral Daily  . insulin aspart  0-15 Units Subcutaneous TID WC  . potassium chloride  10 mEq Oral Daily  . sodium chloride flush  3 mL Intravenous Q12H  . torsemide  20 mg Oral Daily  . verapamil  180 mg Oral Daily   Continuous Infusions: . remdesivir 100 mg in NS 100 mL Stopped (06/25/20 1150)     LOS: 2 days    Time spent: 30 minutes    Barb Merino, MD [facility] Pager [phone]

## 2020-06-25 NOTE — Progress Notes (Signed)
06/25/20 1225  PT Visit Information  Last PT Received On 06/25/20  Assistance Needed +2  History of Present Illness Pt is a 79 y/o female admitted secondary to fall and COVID. Pt was placed on 2L secondary to desaturation upon admission. PMH includes breast CA, dementia, HTN, and dCHF.   Precautions  Precautions Fall  Restrictions  Weight Bearing Restrictions No  Home Living  Family/patient expects to be discharged to: Private residence  Living Arrangements Children  Available Help at Discharge Available 24 hours/day;Family  Type of Home Apartment  Home Access Level entry  Home Layout One level  Bathroom Shower/Tub Tub/shower unit  Camera operator - 4 wheels  Prior Function  Level of Independence Needs assistance  Gait / Transfers Assistance Needed USed rollator for ambulation  ADL's / [place] Daughter assists with bathing  Communication  Communication HOH  Pain Assessment  Pain Assessment Faces  Faces Pain Scale 6  Pain Location L knee with movement   Pain Descriptors / Indicators Aching;Grimacing;Guarding  Pain Intervention(s) Limited activity within patient's tolerance;Monitored during session;Repositioned  Cognition  Arousal/Alertness Awake/alert  Behavior During Therapy WFL for tasks assessed/performed  Overall Cognitive Status No family/caregiver present to determine baseline cognitive functioning  General Comments Dementia at baseline. Noted slow processing  Upper Extremity Assessment  Upper Extremity Assessment Defer to OT evaluation  Lower Extremity Assessment  Lower Extremity Assessment Generalized weakness;LLE deficits/detail  LLE Deficits / Details Reports increased knee pain and had limited ROM  Cervical / Trunk Assessment  Cervical / Trunk Assessment Normal  Bed Mobility  Overal bed mobility Needs Assistance  Bed Mobility Supine to Sit;Sit to Supine;Rolling  Rolling Mod assist  Supine to  sit Mod assist;Max assist  Sit to supine Mod assist;Max assist;+2 for physical assistance  General bed mobility comments Mod-max A for trunk elevation and LE assist throughout. REquired increased time. Mod A to roll from side to side to change bed linen  Transfers  Overall transfer level Needs assistance  Equipment used 2 person hand held assist  Transfers Sit to/from Stand  Sit to Stand Mod assist;+2 physical assistance;Max assist  General transfer comment mod-max A +2  Balance  Overall balance assessment Needs assistance  Sitting-balance support No upper extremity supported;Feet supported  Sitting balance-Leahy Scale Fair  Standing balance support Bilateral upper extremity supported;During functional activity  Standing balance-Leahy Scale Poor  Standing balance comment REliant on UE and external support   General Comments  General comments (skin integrity, edema, etc.) Oxygen sats ranging from 93-95% on 2L throughout.   PT - End of Session  Equipment Utilized During Treatment Oxygen  Activity Tolerance Patient tolerated treatment well  Patient left in bed;with call bell/phone within reach (on stretcher in ED )  Nurse Communication Mobility status  PT Assessment  PT Recommendation/Assessment Patient needs continued PT services  PT Visit Diagnosis Unsteadiness on feet (R26.81);Repeated falls (R29.6);Muscle weakness (generalized) (M62.81);History of falling (Z91.81)  PT Problem List Decreased strength;Decreased balance;Decreased mobility;Decreased activity tolerance;Decreased knowledge of precautions;Decreased knowledge of use of DME;Pain  PT Plan  PT Frequency (ACUTE ONLY) Min 2X/week  PT Treatment/Interventions (ACUTE ONLY) DME instruction;Gait training;Therapeutic activities;Functional mobility training;Therapeutic exercise;Balance training;Patient/family education  AM-PAC PT "6 Clicks" Mobility Outcome Measure (Version 2)  Help needed turning from your back to your side while in a  flat bed without using bedrails? 2  Help needed moving from lying on your back to sitting on the side of a flat bed without using  bedrails? 2  Help needed moving to and from a bed to a chair (including a wheelchair)? 2  Help needed standing up from a chair using your arms (e.g., wheelchair or bedside chair)? 2  Help needed to walk in hospital room? 1  Help needed climbing 3-5 steps with a railing?  1  6 Click Score 10  Consider Recommendation of Discharge To: CIR/SNF/LTACH  PT Recommendation  Follow Up Recommendations SNF;Supervision/Assistance - 24 hour  PT equipment Other (comment) (TBD)  Individuals Consulted  Consulted and Agree with Results and Recommendations Patient  Acute Rehab PT Goals  Patient Stated Goal none stated  PT Goal Formulation With patient  Time For Goal Achievement 07/09/20  Potential to Achieve Goals Good  PT Time Calculation  PT Start Time (ACUTE ONLY) 1225  PT Stop Time (ACUTE ONLY) 1252  PT Time Calculation (min) (ACUTE ONLY) 27 min  PT General Charges  $$ ACUTE PT VISIT 1 Visit  PT Evaluation  $PT Eval Moderate Complexity 1 Mod  PT Treatments  $Therapeutic Activity 8-22 mins   Pt admitted secondary to problem above with deficits above. Pt requiring mod to max A +2 to perform bed mobility and to stand at edge of stretcher. Oxygen sats ranging from 93-95% on 2L throughout; mild SOB noted. Feel pt would benefit from SNF level therapies at d/c given current deficits. Will continue to follow acutely to maximize functional mobility independence and safety.   Reuel Derby, PT, DPT  Acute Rehabilitation Services  Pager: [phone] Office: [phone]

## 2020-06-25 NOTE — ED Notes (Signed)
OT at bedside to work with patient.

## 2020-06-25 NOTE — ED Notes (Signed)
Pt O2 dropping to 86-88%. Went into to pt room to assess pt, pt denies sob or dyspnea, states she was "just sleeping". Put pt on 2L [place], O2 at 98%, pt in NAD, resting comfortably, chest rising and falling

## 2020-06-25 NOTE — ED Notes (Signed)
Received report from previous RN. Pt removed from bedpan, unsuccessful.  EEG tech in room for EEG, pt tolerating well. No distress noted.

## 2020-06-25 NOTE — Progress Notes (Signed)
EEG complete - results pending 

## 2020-06-25 NOTE — Procedures (Signed)
Patient Name: Carol Martin  MRN: [id number]  Epilepsy Attending: Lora Havens  Referring Physician/Provider: Dr Gilles Chiquito Date: 06/25/2020 Duration: 24.57 mins  Patient history: 79yo F with h/o seizure presented with ams. EEG to evaluate for seizure.  Level of alertness: Awake, asleep  AEDs during EEG study: None  Technical aspects: This EEG study was done with scalp electrodes positioned according to the 10-20 International system of electrode placement. Electrical activity was acquired at a sampling rate of 500Hz  and reviewed with a high frequency filter of 70Hz  and a low frequency filter of 1Hz . EEG data were recorded continuously and digitally stored.   Description: The posterior dominant rhythm consists of 8 Hz activity of moderate voltage (25-35 uV) seen predominantly in posterior head regions, symmetric and reactive to eye opening and eye closing. Sleep was characterized by vertex waves, sleep spindles (12 to 14 Hz), maximal frontocentral region.  EEG showed intermittent left> right temporal 3 to 6 Hz theta-delta slowing.  Spike was also seen in left temporal region as well as sharp transients in right temporal region. Hyperventilation and photic stimulation were not performed.     ABNORMALITY -Spike, left temporal region -Intermittent slow, left> right temporal region  IMPRESSION: This study showed evidence of potential epileptogenicity arising from left temporal region as well as non specific cortical dysfunction in left> right temporal region. No seizures were seen throughout the recording.  Mardy Lucier Barbra Sarks

## 2020-06-25 NOTE — ED Notes (Signed)
Ordered breakfast--Carol Martin 

## 2020-06-26 ENCOUNTER — Encounter (HOSPITAL_COMMUNITY): Payer: Self-pay | Admitting: Internal Medicine

## 2020-06-26 LAB — COMPREHENSIVE METABOLIC PANEL
ALT: 15 U/L (ref 0–44)
AST: 43 U/L — ABNORMAL HIGH (ref 15–41)
Albumin: 2.7 g/dL — ABNORMAL LOW (ref 3.5–5.0)
Alkaline Phosphatase: 67 U/L (ref 38–126)
Anion gap: 11 (ref 5–15)
BUN: 14 mg/dL (ref 8–23)
CO2: 28 mmol/L (ref 22–32)
Calcium: 8.5 mg/dL — ABNORMAL LOW (ref 8.9–10.3)
Chloride: 100 mmol/L (ref 98–111)
Creatinine, Ser: 0.68 mg/dL (ref 0.44–1.00)
GFR calc Af Amer: >60 mL/min (ref >60)
GFR calc non Af Amer: >60 mL/min (ref >60)
Glucose, Bld: 142 mg/dL — ABNORMAL HIGH (ref 70–99)
Potassium: 3.6 mmol/L (ref 3.5–5.1)
Sodium: 139 mmol/L (ref 135–145)
Total Bilirubin: 0.6 mg/dL (ref 0.3–1.2)
Total Protein: 6.1 g/dL — ABNORMAL LOW (ref 6.5–8.1)

## 2020-06-26 LAB — CBG MONITORING, ED
Glucose-Capillary: 126 mg/dL — ABNORMAL HIGH (ref 70–99)
Glucose-Capillary: 143 mg/dL — ABNORMAL HIGH (ref 70–99)

## 2020-06-26 LAB — CBC WITH DIFFERENTIAL/PLATELET
Abs Immature Granulocytes: 0.01 10*3/uL (ref 0.00–0.07)
Basophils Absolute: 0 10*3/uL (ref 0.0–0.1)
Basophils Relative: 0 %
Eosinophils Absolute: 0 10*3/uL (ref 0.0–0.5)
Eosinophils Relative: 0 %
HCT: 43.6 % (ref 36.0–46.0)
Hemoglobin: 13.9 g/dL (ref 12.0–15.0)
Immature Granulocytes: 0 %
Lymphocytes Relative: 18 %
Lymphs Abs: 0.8 10*3/uL (ref 0.7–4.0)
MCH: 30.3 pg (ref 26.0–34.0)
MCHC: 31.9 g/dL (ref 30.0–36.0)
MCV: 95 fL (ref 80.0–100.0)
Monocytes Absolute: 0.3 10*3/uL (ref 0.1–1.0)
Monocytes Relative: 7 %
Neutro Abs: 3.4 10*3/uL (ref 1.7–7.7)
Neutrophils Relative %: 75 %
Platelets: 168 10*3/uL (ref 150–400)
RBC: 4.59 MIL/uL (ref 3.87–5.11)
RDW: 12.9 % (ref 11.5–15.5)
WBC: 4.6 10*3/uL (ref 4.0–10.5)
nRBC: 0 % (ref 0.0–0.2)

## 2020-06-26 LAB — GLUCOSE, CAPILLARY
Glucose-Capillary: 117 mg/dL — ABNORMAL HIGH (ref 70–99)
Glucose-Capillary: 117 mg/dL — ABNORMAL HIGH (ref 70–99)

## 2020-06-26 LAB — FERRITIN: Ferritin: 134 ng/mL (ref 11–307)

## 2020-06-26 LAB — D-DIMER, QUANTITATIVE: D-Dimer, Quant: 0.88 ug/mL-FEU — ABNORMAL HIGH (ref 0.00–0.50)

## 2020-06-26 LAB — C-REACTIVE PROTEIN: CRP: 4.1 mg/dL — ABNORMAL HIGH (ref <1.0)

## 2020-06-26 MED ORDER — LEVETIRACETAM 500 MG PO TABS
500.0000 mg | ORAL_TABLET | Freq: Two times a day (BID) | ORAL | Status: DC
  Administered 2020-06-26 – 2020-06-28 (×5): 500 mg via ORAL
  Filled 2020-06-26 (×5): qty 1

## 2020-06-26 MED ORDER — ENOXAPARIN SODIUM 80 MG/0.8ML ~~LOC~~ SOLN
65.0000 mg | SUBCUTANEOUS | Status: DC
  Administered 2020-06-26 – 2020-06-27 (×2): 65 mg via SUBCUTANEOUS
  Filled 2020-06-26 (×2): qty 0.8

## 2020-06-26 NOTE — ED Notes (Signed)
Pt assisted from stretcher to recliner for breakfast, able to do this with 1x assist.

## 2020-06-26 NOTE — Progress Notes (Signed)
PROGRESS NOTE    Carol Martin  HQR:[id number] DOB: 10/28/1941 DOA: 06/23/2020 PCP: Nolene Ebbs, MD    Brief Narrative:  79 year old female with history of hypertension, GERD, chronic diastolic heart failure, history of seizures not on medications, mild Alzheimer's disease and arthritis, history of breast cancer presented with encephalopathy.  Patient has exposure to her sick daughter with COVID-19 last Monday.  She was found fallen from the bed and confused. In the emergency room, she was febrile and mildly tachycardic. Elevated inflammatory markers.  COVID-19 positive.   Assessment & Plan:   Active Problems:   Dyspnea   Hypertension   Alzheimer's disease (HCC)   Chronic diastolic CHF (congestive heart failure) (HCC)   Chronic pain of both knees   COVID-19  COVID-19 viral infection with hypoxia: Patient is weaned off to room air today.  Occasionally on 2 L oxygen on mobility.  Medically stabilizing. chest physiotherapy, incentive spirometry, deep breathing exercises, sputum induction, mucolytic's and bronchodilators. Supplemental oxygen to keep saturations more than 90%. Covid directed therapy with , steroids, dexamethasone remdesivir, day 4/5 antibiotics, not indicated. Procalcitonin 0.35, no indication for antibiotics.  Blood cultures were drawn.  Urinalysis was normal.  COVID-19 Labs  Recent Labs    06/23/20 2219 06/23/20 2219 06/24/20 0450 06/25/20 0611 06/26/20 0500  DDIMER 1.63*   < > 1.44* 1.25* 0.88*  FERRITIN 66   < > 85 138 134  LDH 177  --   --   --   --   CRP 4.3*   < > 9.6* 8.3* 4.1*   < > = values in this interval not displayed.    Lab Results  Component Value Date   SARSCOV2NAA POSITIVE (A) 06/23/2020   SpO2: 98 % O2 Flow Rate (L/min): 2 L/min  Hypertension: Blood pressure stable on lisinopril hydrochlorothiazide and verapamil.  Continue.  Chronic diastolic congestive heart failure: Euvolemic.  On torsemide, hydrochlorothiazide and potassium  that is continued.  History of seizure: Seizure precautions.  Not on treatment.  Probably had recurrent seizure. Previously resistant to treatment.  Now she is okay to start treatment. 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  She was prescribed Keppra 500 mg twice a day in the past but never took it. I discussed in detail with patient and she is agreeable to use the medication.  Her daughter will make appointment with her neurologist.    DVT prophylaxis: enoxaparin (LOVENOX) injection 40 mg Start: 06/23/20 2330   Code Status: Full code Family Communication: Called patient's daughter, Ms. Monica on the phone. Her daughter is also sick and has other issues.  She wants to take her home when able to make arrangements another 24 to 48 hours.  Social worker on board. Disposition Plan: Status is: Inpatient  Remains inpatient appropriate because:IV treatments appropriate due to intensity of illness or inability to take PO and Inpatient level of care appropriate due to severity of illness   Dispo: The patient is from: Home              Anticipated d/c is to: Home              Anticipated d/c date is: 2 days, anticipate next 24 to 48 hours.              Patient currently is not medically stable to d/c.         Consultants:   None  Procedures:   EEG, pending  Antimicrobials:  Antibiotics Given (last 72 hours)  Date/Time Action Medication Dose Rate   06/23/20 2129 New Bag/Given   remdesivir 200 mg in sodium chloride 0.9% 250 mL IVPB 200 mg 580 mL/hr   06/24/20 1028 New Bag/Given   remdesivir 100 mg in sodium chloride 0.9 % 100 mL IVPB 100 mg 200 mL/hr   06/25/20 1011 New Bag/Given   remdesivir 100 mg in sodium chloride 0.9 % 100 mL IVPB 100 mg 200 mL/hr   06/26/20 1108 New Bag/Given   remdesivir 100 mg in sodium chloride 0.9 % 100 mL IVPB 100 mg 200 mL/hr         Subjective: Seen and examined.  Sitting in couch.  Today without complaints.  Wondering  about going home.  Intermittently on 2 L oxygen on mobility.  Denies any cough cold.  No fever. "If you put me on a good seizure medicine I will be ready to take it"  Objective: Vitals:   06/26/20 1300 06/26/20 1345 06/26/20 1400 06/26/20 1501  BP: (!) 141/97   132/84  Pulse: 79 88  73  Resp:    (!) 24  Temp:  98.9 F (37.2 C)  98 F (36.7 C)  TempSrc:  Oral  Oral  SpO2: 96% 96%  98%  Weight:    127.1 kg  Height:   5\' 1"  (1.549 m)     Intake/Output Summary (Last 24 hours) at 06/26/2020 1543 Last data filed at 06/26/2020 1500 Gross per 24 hour  Intake 220 ml  Output 1200 ml  Net -980 ml   Filed Weights   06/26/20 1501  Weight: 127.1 kg    Examination:  General exam: Appears calm and comfortable  Slightly anxious.  Alert oriented x 2.  Pleasant.  Not in any distress. Respiratory system: Clear to auscultation. Respiratory effort normal. Cardiovascular system: S1 & S2 heard, RRR. No JVD, murmurs, rubs, gallops or clicks. No pedal edema. Gastrointestinal system: Abdomen is nondistended, soft and nontender. No organomegaly or masses felt. Normal bowel sounds heard. Extremities: Symmetric 5 x 5 power.     Data Reviewed: I have personally reviewed following labs and imaging studies  CBC: Recent Labs  Lab 06/23/20 1717 06/24/20 0450 06/25/20 0611 06/26/20 0500  WBC 7.9 7.5 4.2 4.6  NEUTROABS 6.8 6.6 3.2 3.4  HGB 14.2 14.2 13.5 13.9  HCT 44.2 43.4 42.6 43.6  MCV 95.5 96.0 95.5 95.0  PLT 187 162 152 681   Basic Metabolic Panel: Recent Labs  Lab 06/23/20 1717 06/24/20 0114 06/24/20 0450 06/25/20 0611 06/26/20 0500  NA 140  --  140 138 139  K 3.6  --  3.9 3.7 3.6  CL 103  --  105 103 100  CO2 24  --  25 24 28   GLUCOSE 119*  --  135* 143* 142*  BUN 7*  --  8 12 14   CREATININE 0.84 0.70 0.70 0.68 0.68  CALCIUM 8.9  --  8.6* 8.4* 8.5*  MG 1.9  --   --   --   --    GFR: Estimated Creatinine Clearance: 72.7 mL/min (by C-G formula based on SCr of 0.68  mg/dL). Liver Function Tests: Recent Labs  Lab 06/23/20 1717 06/24/20 0450 06/25/20 0611 06/26/20 0500  AST 26 28 47* 43*  ALT 12 9 15 15   ALKPHOS 87 82 67 67  BILITOT 1.1 0.9 0.8 0.6  PROT 6.7 6.2* 5.9* 6.1*  ALBUMIN 3.4* 2.9* 2.8* 2.7*   Recent Labs  Lab 06/23/20 1717  LIPASE 19   Recent Labs  Lab 06/23/20 1732  AMMONIA 11   Coagulation Profile: No results for input(s): INR, PROTIME in the last 168 hours. Cardiac Enzymes: No results for input(s): CKTOTAL, CKMB, CKMBINDEX, TROPONINI in the last 168 hours. BNP (last 3 results) No results for input(s): PROBNP in the last 8760 hours. HbA1C: No results for input(s): HGBA1C in the last 72 hours. CBG: Recent Labs  Lab 06/25/20 0828 06/25/20 1713 06/25/20 2225 06/26/20 0806 06/26/20 1138  GLUCAP 146* 95 124* 126* 143*   Lipid Profile: Recent Labs    06/23/20 2219  TRIG 34   Thyroid Function Tests: No results for input(s): TSH, T4TOTAL, FREET4, T3FREE, THYROIDAB in the last 72 hours. Anemia Panel: Recent Labs    06/25/20 0611 06/26/20 0500  FERRITIN 138 134   Sepsis Labs: Recent Labs  Lab 06/23/20 1717 06/23/20 2219  PROCALCITON  --  0.35  LATICACIDVEN 1.4  --     Recent Results (from the past 240 hour(s))  Urine culture     Status: None   Collection Time: 06/23/20  5:05 PM   Specimen: Urine, Catheterized  Result Value Ref Range Status   Specimen Description URINE, CATHETERIZED  Final   Special Requests NONE  Final   Culture   Final    NO GROWTH Performed at [facility], [place] [address] [place], [place] [address]    Report Status 06/24/2020 FINAL  Final  SARS Coronavirus 2 by RT PCR (hospital order, performed in [facility] hospital lab) Nasopharyngeal Nasopharyngeal Swab     Status: Abnormal   Collection Time: 06/23/20  5:07 PM   Specimen: Nasopharyngeal Swab  Result Value Ref Range Status   SARS Coronavirus 2 POSITIVE (A) NEGATIVE Final    Comment: RESULT CALLED TO, READ BACK BY  AND VERIFIED WITH: RN Lelon Huh [phone] FCP (NOTE) SARS-CoV-2 target nucleic acids are DETECTED  SARS-CoV-2 RNA is generally detectable in upper respiratory specimens  during the acute phase of infection.  Positive results are indicative  of the presence of the identified virus, but do not rule out bacterial infection or co-infection with other pathogens not detected by the test.  Clinical correlation with patient history and  other diagnostic information is necessary to determine patient infection status.  The expected result is negative.  Fact Sheet for Patients:   StrictlyIdeas.no   Fact Sheet for Healthcare Providers:   BankingDealers.co.za    This test is not yet approved or cleared by the [place] FDA and  has been authorized for detection and/or diagnosis of SARS-CoV-2 by FDA under an Emergency Use Authorization (EUA).  This EUA will remain in effect (meaning this test can b e used) for the duration of  the COVID-19 declaration under Section 564(b)(1) of the Act, 21 U.S.C. section 360-bbb-3(b)(1), unless the authorization is terminated or revoked sooner.  Performed at [facility], [place] [address], [place], [place] [address]   Blood culture (routine x 2)     Status: None (Preliminary result)   Collection Time: 06/23/20  5:18 PM   Specimen: BLOOD  Result Value Ref Range Status   Specimen Description BLOOD RIGHT ANTECUBITAL  Final   Special Requests   Final    BOTTLES DRAWN AEROBIC AND ANAEROBIC Blood Culture adequate volume   Culture   Final    NO GROWTH 3 DAYS Performed at [facility], [place] [address], [place], [place] [address]    Report Status PENDING  Incomplete  Blood culture (routine x 2)  Status: None (Preliminary result)   Collection Time: 06/23/20  5:19 PM   Specimen: BLOOD  Result Value Ref Range Status   Specimen Description BLOOD SITE NOT SPECIFIED  Final   Special Requests   Final     BOTTLES DRAWN AEROBIC AND ANAEROBIC Blood Culture adequate volume   Culture   Final    NO GROWTH 3 DAYS Performed at [facility], [address], [place],  [address]    Report Status PENDING  Incomplete         Radiology Studies: EEG adult  Result Date: 01-Jul-2020 Lora Havens, MD     07/01/2020  5:08 PM Patient Name: JOAN HERSCHBERGER MRN: [id number] Epilepsy Attending: Lora Havens Referring Physician/Provider: Dr Gilles Chiquito Date: 07-01-20 Duration: 24.57 mins Patient history: 79yo F with h/o seizure presented with ams. EEG to evaluate for seizure. Level of alertness: Awake, asleep AEDs during EEG study: None Technical aspects: This EEG study was done with scalp electrodes positioned according to the 10-20 International system of electrode placement. Electrical activity was acquired at a sampling rate of 500Hz  and reviewed with a high frequency filter of 70Hz  and a low frequency filter of 1Hz . EEG data were recorded continuously and digitally stored. Description: The posterior dominant rhythm consists of 8 Hz activity of moderate voltage (25-35 uV) seen predominantly in posterior head regions, symmetric and reactive to eye opening and eye closing. Sleep was characterized by vertex waves, sleep spindles (12 to 14 Hz), maximal frontocentral region.  EEG showed intermittent left> right temporal 3 to 6 Hz theta-delta slowing.  Spike was also seen in left temporal region as well as sharp transients in right temporal region. Hyperventilation and photic stimulation were not performed.   ABNORMALITY -Spike, left temporal region -Intermittent slow, left> right temporal region IMPRESSION: This study showed evidence of potential epileptogenicity arising from left temporal region as well as non specific cortical dysfunction in left> right temporal region. No seizures were seen throughout the recording. Priyanka O Yadav        Scheduled Meds: . albuterol  2 puff Inhalation Q6H  .  dexamethasone (DECADRON) injection  6 mg Intravenous Q24H  . enoxaparin (LOVENOX) injection  40 mg Subcutaneous QHS  . lisinopril  20 mg Oral Daily   And  . hydrochlorothiazide  12.5 mg Oral Daily  . insulin aspart  0-15 Units Subcutaneous TID WC  . levETIRAcetam  500 mg Oral BID  . potassium chloride  10 mEq Oral Daily  . sodium chloride flush  3 mL Intravenous Q12H  . torsemide  20 mg Oral Daily  . verapamil  180 mg Oral Daily   Continuous Infusions: . remdesivir 100 mg in NS 100 mL Stopped (06/26/20 1319)     LOS: 3 days    Time spent: 30 minutes    Barb Merino, MD [facility] Pager [phone]

## 2020-06-26 NOTE — TOC Progression Note (Addendum)
Transition of Care [facility]) - Progression Note    Patient Details  Name: Carol Martin MRN: [id number] Date of Birth: 06/29/1941  Transition of Care [facility]) CM/SW [place], RN Phone Number: 06/26/2020, 2:44 PM  Clinical Narrative:    Patient daughter called back, she will be taking patient to her home in [place] , [place] When ready for Home health, would like to look at [facility] and Caryl Comes [phone]. She will not be available tomorrow as she is taking her daughter to surgery, but will be on Thursday.    Expected Discharge Plan: [place] Barriers to Discharge: Continued Medical Work up  Expected Discharge Plan and Services Expected Discharge Plan: [place]                                               Social Determinants of Health (SDOH) Interventions    Readmission Risk Interventions No flowsheet data found.

## 2020-06-26 NOTE — Plan of Care (Signed)
Received patient on unit at 1447, Patient pivoted to the bed and is resting well.   Problem: Education: Goal: Knowledge of risk factors and measures for prevention of condition will improve Outcome: Progressing   Problem: Coping: Goal: Psychosocial and spiritual needs will be supported Outcome: Progressing   Problem: Respiratory: Goal: Will maintain a patent airway Outcome: Progressing Goal: Complications related to the disease process, condition or treatment will be avoided or minimized Outcome: Progressing

## 2020-06-26 NOTE — TOC Progression Note (Signed)
Transition of Care [facility]) - Progression Note    Patient Details  Name: Carol Martin MRN: [id number] Date of Birth: 04/11/1941  Transition of Care [facility]) CM/SW [place], RN Phone Number: 06/26/2020, 10:47 AM  Clinical Narrative:     Damaris Schooner to daughter [name] regarding recommendation from PT and OT about Rehabilitation SNF vs Home health with supervision. She understands and will look at options with other > and speak to her mother regarding all options so they can make this decision together.  If goes home will need  PT, OT Aide and will need qualifications for oxygen.   Expected Discharge Plan: [place] Barriers to Discharge: Continued Medical Work up  Expected Discharge Plan and Services Expected Discharge Plan: [place]                                               Social Determinants of Health (SDOH) Interventions    Readmission Risk Interventions No flowsheet data found.

## 2020-06-26 NOTE — TOC Progression Note (Signed)
Transition of Care [facility]) - Progression Note    Patient Details  Name: Carol Martin MRN: [id number] Date of Birth: [birth year]  Transition of Care [facility]) CM/SW [place], RN Phone Number: 06/26/2020, 1:12 PM  Clinical Narrative:    Daughter called and stated she would like to take mother back to her house in [place] with [facility].Marland Kitchen She has Walker, Bedside commode. And lift at her house. The only issue is her daughter is having surgery tomorrow and will need to be with her. She will call this CM back later to let me know the times of that.    Expected Discharge Plan: [place] Barriers to Discharge: Continued Medical Work up  Expected Discharge Plan and Services Expected Discharge Plan: [place]                                               Social Determinants of Health (SDOH) Interventions    Readmission Risk Interventions No flowsheet data found.

## 2020-06-26 NOTE — Progress Notes (Signed)
Ok to increase her lovenox to 0.5mg /kg/day due to BMI 52 per Dr. Raelyn Mora.  Change lovenox to 65mg  SQ qday  Onnie Boer, PharmD, [place], AAHIVP, CPP Infectious Disease Pharmacist 06/26/2020 4:42 PM

## 2020-06-27 LAB — CBC WITH DIFFERENTIAL/PLATELET
Abs Immature Granulocytes: 0.03 10*3/uL (ref 0.00–0.07)
Basophils Absolute: 0 10*3/uL (ref 0.0–0.1)
Basophils Relative: 0 %
Eosinophils Absolute: 0 10*3/uL (ref 0.0–0.5)
Eosinophils Relative: 0 %
HCT: 46.8 % — ABNORMAL HIGH (ref 36.0–46.0)
Hemoglobin: 14.8 g/dL (ref 12.0–15.0)
Immature Granulocytes: 1 %
Lymphocytes Relative: 27 %
Lymphs Abs: 1.1 10*3/uL (ref 0.7–4.0)
MCH: 30.5 pg (ref 26.0–34.0)
MCHC: 31.6 g/dL (ref 30.0–36.0)
MCV: 96.5 fL (ref 80.0–100.0)
Monocytes Absolute: 0.3 10*3/uL (ref 0.1–1.0)
Monocytes Relative: 7 %
Neutro Abs: 2.6 10*3/uL (ref 1.7–7.7)
Neutrophils Relative %: 65 %
Platelets: 200 10*3/uL (ref 150–400)
RBC: 4.85 MIL/uL (ref 3.87–5.11)
RDW: 12.7 % (ref 11.5–15.5)
WBC: 3.9 10*3/uL — ABNORMAL LOW (ref 4.0–10.5)
nRBC: 0 % (ref 0.0–0.2)

## 2020-06-27 LAB — COMPREHENSIVE METABOLIC PANEL
ALT: 19 U/L (ref 0–44)
AST: 40 U/L (ref 15–41)
Albumin: 3 g/dL — ABNORMAL LOW (ref 3.5–5.0)
Alkaline Phosphatase: 66 U/L (ref 38–126)
Anion gap: 15 (ref 5–15)
BUN: 15 mg/dL (ref 8–23)
CO2: 25 mmol/L (ref 22–32)
Calcium: 8.6 mg/dL — ABNORMAL LOW (ref 8.9–10.3)
Chloride: 99 mmol/L (ref 98–111)
Creatinine, Ser: 0.74 mg/dL (ref 0.44–1.00)
GFR calc Af Amer: >60 mL/min (ref >60)
GFR calc non Af Amer: >60 mL/min (ref >60)
Glucose, Bld: 97 mg/dL (ref 70–99)
Potassium: 3.2 mmol/L — ABNORMAL LOW (ref 3.5–5.1)
Sodium: 139 mmol/L (ref 135–145)
Total Bilirubin: 0.8 mg/dL (ref 0.3–1.2)
Total Protein: 6.5 g/dL (ref 6.5–8.1)

## 2020-06-27 LAB — D-DIMER, QUANTITATIVE: D-Dimer, Quant: 0.61 ug/mL-FEU — ABNORMAL HIGH (ref 0.00–0.50)

## 2020-06-27 LAB — C-REACTIVE PROTEIN: CRP: 1.8 mg/dL — ABNORMAL HIGH (ref <1.0)

## 2020-06-27 LAB — GLUCOSE, CAPILLARY
Glucose-Capillary: 138 mg/dL — ABNORMAL HIGH (ref 70–99)
Glucose-Capillary: 186 mg/dL — ABNORMAL HIGH (ref 70–99)
Glucose-Capillary: 80 mg/dL (ref 70–99)
Glucose-Capillary: 119 mg/dL — ABNORMAL HIGH (ref 70–99)

## 2020-06-27 LAB — FERRITIN: Ferritin: 137 ng/mL (ref 11–307)

## 2020-06-27 MED ORDER — DICLOFENAC SODIUM 1 % EX GEL
2.0000 g | Freq: Four times a day (QID) | CUTANEOUS | Status: DC
  Administered 2020-06-28 (×2): 2 g via TOPICAL
  Filled 2020-06-27: qty 100

## 2020-06-27 MED ORDER — POTASSIUM CHLORIDE CRYS ER 20 MEQ PO TBCR
40.0000 meq | EXTENDED_RELEASE_TABLET | Freq: Once | ORAL | Status: AC
  Administered 2020-06-27: 40 meq via ORAL
  Filled 2020-06-27: qty 2

## 2020-06-27 NOTE — Progress Notes (Signed)
PROGRESS NOTE    Carol Martin  DPO:[id number] DOB: October 30, 1941 DOA: 06/23/2020 PCP: Nolene Ebbs, MD   Chief Complaint  Patient presents with  . Altered Mental Status  . Shortness of Breath   Brief Narrative:  79 year old female with history of hypertension, GERD, chronic diastolic heart failure, history of seizures not on medications, mild Alzheimer's disease and arthritis, history of breast cancer presented with encephalopathy.  Patient has exposure to her sick daughter with COVID-19 last Monday.  She was found fallen from the bed and confused. In the emergency room, she was febrile and mildly tachycardic. Elevated inflammatory markers.  COVID-19 positive.  Assessment & Plan:   Active Problems:   Dyspnea   Hypertension   Alzheimer's disease (HCC)   Chronic diastolic CHF (congestive heart failure) (HCC)   Chronic pain of both knees   COVID-19  COVID-19 viral infection with hypoxia: CXR with vascular congestion without overt edema Improving, doing well on RA, ambulated without desaturation 8/25 remedsivir completed 8/25.  Continue steroids. Afebrile, WBC improving   Improving inflammatory labs Strict I/O, daily weights Prone as able, OOB, flutter, IS Blood and urine cx NGTD  COVID-19 Labs  Recent Labs    06/25/20 0611 06/26/20 0500 06/27/20 0958  DDIMER 1.25* 0.88* 0.61*  FERRITIN 138 134 137  CRP 8.3* 4.1* 1.8*    Lab Results  Component Value Date   SARSCOV2NAA POSITIVE (Bessie Boyte) 06/23/2020   Hypertension: Blood pressure stable on lisinopril hydrochlorothiazide and verapamil.  Continue. Pt on loop diuretic and thiazide -> will discuss with pt daughter prior to discharge.  Chronic diastolic congestive heart failure: Euvolemic.  On torsemide, hydrochlorothiazide and potassium that is continued. Again, will need to discuss loop + thiazide prior to d/c  History of seizure: Seizure precautions.  Not on treatment.  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 Previously resistant to treatment.  Now she is okay to start treatment. 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  She was prescribed Keppra 500 mg twice Henchy Mccauley day in the past but never took it. Needs neurology follow up Seizure precautions  DVT prophylaxis: lovenox Code Status: full  Family Communication: none at bedside - daughter over phone  Disposition:   Status is: Inpatient  Remains inpatient appropriate because:Inpatient level of care appropriate due to severity of illness   Dispo: The patient is from: Home              Anticipated d/c is to: Home              Anticipated d/c date is: 1 day              Patient currently is not medically stable to d/c.   Consultants:   none  Procedures:  EEG IMPRESSION: This study showed evidence of potential epileptogenicity arising from left temporal region as well as non specific cortical dysfunction in left> right temporal region. No seizures were seen throughout the recording.   Antimicrobials: Anti-infectives (From admission, onward)   Start     Dose/Rate Route Frequency Ordered Stop   06/24/20 1000  remdesivir 100 mg in sodium chloride 0.9 % 100 mL IVPB       "Followed by" Linked Group Details   100 mg 200 mL/hr over 30 Minutes Intravenous Every 24 hours 06/23/20 1909 06/27/20 0943   06/23/20 1915  remdesivir 200 mg in sodium chloride 0.9% 250 mL IVPB       "Followed  by" Linked Group Details   200 mg 580 mL/hr over 30 Minutes Intravenous Once 06/23/20 1909 06/24/20 0001      Subjective: No new complaints  Objective: Vitals:   06/27/20 0507 06/27/20 0800 06/27/20 1239 06/27/20 1424  BP: (!) 141/83   127/81  Pulse: 61   78  Resp: 15  15 (!) 21  Temp: 98.4 F (36.9 C)   98.4 F (36.9 C)  TempSrc: Oral   Oral  SpO2: 93% 95% 98% 95%  Weight:      Height:        Intake/Output Summary (Last 24 hours) at 06/27/2020 1707 Last data filed at 06/27/2020 1408 Gross per  24 hour  Intake 940 ml  Output 2250 ml  Net -1310 ml   Filed Weights   06/26/20 1501  Weight: 127.1 kg    Examination:  General exam: Appears calm and comfortable  Respiratory system: Clear to auscultation. Respiratory effort normal. Cardiovascular system: S1 & S2 heard, RRR. Gastrointestinal system: Abdomen is nondistended, soft and nontender. Central nervous system: Alert and oriented. No focal neurological deficits. Extremities: no LEE Skin: No rashes, lesions or ulcers Psychiatry: Judgement and insight appear normal. Mood & affect appropriate.     Data Reviewed: I have personally reviewed following labs and imaging studies  CBC: Recent Labs  Lab 06/23/20 1717 06/24/20 0450 06/25/20 0611 06/26/20 0500 06/27/20 0958  WBC 7.9 7.5 4.2 4.6 3.9*  NEUTROABS 6.8 6.6 3.2 3.4 2.6  HGB 14.2 14.2 13.5 13.9 14.8  HCT 44.2 43.4 42.6 43.6 46.8*  MCV 95.5 96.0 95.5 95.0 96.5  PLT 187 162 152 168 195    Basic Metabolic Panel: Recent Labs  Lab 06/23/20 1717 06/23/20 1717 06/24/20 0114 06/24/20 0450 06/25/20 0611 06/26/20 0500 06/27/20 0958  NA 140  --   --  140 138 139 139  K 3.6  --   --  3.9 3.7 3.6 3.2*  CL 103  --   --  105 103 100 99  CO2 24  --   --  25 24 28 25   GLUCOSE 119*  --   --  135* 143* 142* 97  BUN 7*  --   --  8 12 14 15   CREATININE 0.84   < > 0.70 0.70 0.68 0.68 0.74  CALCIUM 8.9  --   --  8.6* 8.4* 8.5* 8.6*  MG 1.9  --   --   --   --   --   --    < > = values in this interval not displayed.    GFR: Estimated Creatinine Clearance: 72.7 mL/min (by C-G formula based on SCr of 0.74 mg/dL).  Liver Function Tests: Recent Labs  Lab 06/23/20 1717 06/24/20 0450 06/25/20 0611 06/26/20 0500 06/27/20 0958  AST 26 28 47* 43* 40  ALT 12 9 15 15 19   ALKPHOS 87 82 67 67 66  BILITOT 1.1 0.9 0.8 0.6 0.8  PROT 6.7 6.2* 5.9* 6.1* 6.5  ALBUMIN 3.4* 2.9* 2.8* 2.7* 3.0*    CBG: Recent Labs  Lab 06/26/20 1705 06/26/20 2118 06/27/20 0716  06/27/20 1215 06/27/20 1605  GLUCAP 117* 117* 138* 186* 80     Recent Results (from the past 240 hour(s))  Urine culture     Status: None   Collection Time: 06/23/20  5:05 PM   Specimen: Urine, Catheterized  Result Value Ref Range Status   Specimen Description URINE, CATHETERIZED  Final   Special Requests NONE  Final   Culture  Final    NO GROWTH Performed at [facility], [place] [address], [place], [place] [address]    Report Status 06/24/2020 FINAL  Final  SARS Coronavirus 2 by RT PCR (hospital order, performed in [facility] hospital lab) Nasopharyngeal Nasopharyngeal Swab     Status: Abnormal   Collection Time: 06/23/20  5:07 PM   Specimen: Nasopharyngeal Swab  Result Value Ref Range Status   SARS Coronavirus 2 POSITIVE (Elbert Polyakov) NEGATIVE Final    Comment: RESULT CALLED TO, READ BACK BY AND VERIFIED WITH: RN Lelon Huh [phone] FCP (NOTE) SARS-CoV-2 target nucleic acids are DETECTED  SARS-CoV-2 RNA is generally detectable in upper respiratory specimens  during the acute phase of infection.  Positive results are indicative  of the presence of the identified virus, but do not rule out bacterial infection or co-infection with other pathogens not detected by the test.  Clinical correlation with patient history and  other diagnostic information is necessary to determine patient infection status.  The expected result is negative.  Fact Sheet for Patients:   StrictlyIdeas.no   Fact Sheet for Healthcare Providers:   BankingDealers.co.za    This test is not yet approved or cleared by the [place] FDA and  has been authorized for detection and/or diagnosis of SARS-CoV-2 by FDA under an Emergency Use Authorization (EUA).  This EUA will remain in effect (meaning this test can b e used) for the duration of  the COVID-19 declaration under Section 564(b)(1) of the Act, 21 U.S.C. section 360-bbb-3(b)(1), unless the authorization  is terminated or revoked sooner.  Performed at [facility], [place] [address] [place], [place] [address]   Blood culture (routine x 2)     Status: None (Preliminary result)   Collection Time: 06/23/20  5:18 PM   Specimen: BLOOD  Result Value Ref Range Status   Specimen Description BLOOD RIGHT ANTECUBITAL  Final   Special Requests   Final    BOTTLES DRAWN AEROBIC AND ANAEROBIC Blood Culture adequate volume   Culture   Final    NO GROWTH 4 DAYS Performed at [facility], [place] [address] [place], [place] [address]    Report Status PENDING  Incomplete  Blood culture (routine x 2)     Status: None (Preliminary result)   Collection Time: 06/23/20  5:19 PM   Specimen: BLOOD  Result Value Ref Range Status   Specimen Description BLOOD SITE NOT SPECIFIED  Final   Special Requests   Final    BOTTLES DRAWN AEROBIC AND ANAEROBIC Blood Culture adequate volume   Culture   Final    NO GROWTH 4 DAYS Performed at [facility], [place] [address] [place], [place] [address]    Report Status PENDING  Incomplete         Radiology Studies: No results found.      Scheduled Meds: . albuterol  2 puff Inhalation Q6H  . dexamethasone (DECADRON) injection  6 mg Intravenous Q24H  . enoxaparin (LOVENOX) injection  65 mg Subcutaneous Q24H  . lisinopril  20 mg Oral Daily   And  . hydrochlorothiazide  12.5 mg Oral Daily  . insulin aspart  0-15 Units Subcutaneous TID WC  . levETIRAcetam  500 mg Oral BID  . potassium chloride  10 mEq Oral Daily  . sodium chloride flush  3 mL Intravenous Q12H  . torsemide  20 mg Oral Daily  . verapamil  180 mg Oral Daily   Continuous Infusions:  LOS: 4 days    Time spent: over 30 min    Fayrene Helper, MD [facility]   To contact the attending provider between 7A-7P or the covering provider during after hours 7P-7A, please log into the web site [email or web address] and access using universal [place] password for that web site. If  you do not have the password, please call the hospital operator.  06/27/2020, 5:07 PM

## 2020-06-27 NOTE — Progress Notes (Signed)
Occupational Therapy Treatment Patient Details Name: Carol Martin MRN: [id number] DOB: Oct 28, 1941 Today's Date: 06/27/2020    History of present illness Pt is a 79 y/o female admitted secondary to fall and COVID. Pt was placed on 2L secondary to desaturation upon admission. PMH includes breast CA, dementia, HTN, and dCHF.    OT comments  Difficulty registering pleth however pt completed session on RA without SOB. Mod A to boost to stand then able to take several pivotal steps. Pt complaining of L knee pain and may benefit from a pain patch to help with mobility. Bariatric commode placed in room. Encouraged nsg techs to mobilize to [facility] rather than use purewick to help mobilize pt to facilitate DC home. Pt may need wc for home use pending distance she can ambulate. Will further assess.   Follow Up Recommendations  Home health OT;Supervision/Assistance - 24 hour;SNF (pending progress)    Equipment Recommendations  3 in 1 bedside commode;Wheelchair (measurements OT);Wheelchair cushion (measurements OT) (wide; wide wc)    Recommendations for Other Services      Precautions / Restrictions Precautions Precautions: Fall       Mobility Bed Mobility               General bed mobility comments: OOB in chair  Transfers Overall transfer level: Needs assistance   Transfers: Sit to/from Stand Sit to Stand: Mod assist         General transfer comment: mod A to boost up from chair. Would most likely do better from higher surface; able to take several steps with min guard A    Balance     Sitting balance-Leahy Scale: Fair       Standing balance-Leahy Scale: Poor                             ADL either performed or assessed with clinical judgement   ADL Overall ADL's : Needs assistance/impaired     Grooming: Set up;Sitting   Upper Body Bathing: Set up;Sitting                   Toileting- Clothing Manipulation and Hygiene: Maximal assistance        Functional mobility during ADLs: Moderate assistance;Rolling walker;Cueing for safety General ADL Comments: May benefit form toilet aid     Vision       Perception     Praxis      Cognition Arousal/Alertness: Awake/alert Behavior During Therapy: WFL for tasks assessed/performed Overall Cognitive Status: History of cognitive impairments - at baseline                                          Exercises     Shoulder Instructions       General Comments      Pertinent Vitals/ Pain       Pain Assessment: Faces Faces Pain Scale: Hurts little more Pain Location: L knee with movement  Pain Descriptors / Indicators: Discomfort Pain Intervention(s): Limited activity within patient's tolerance  Home Living                                          Prior Functioning/Environment  Frequency  Min 2X/week        Progress Toward Goals  OT Goals(current goals can now be found in the care plan section)  Progress towards OT goals: Progressing toward goals  Acute Rehab OT Goals Patient Stated Goal: to go home OT Goal Formulation: With patient Time For Goal Achievement: 07/09/20 Potential to Achieve Goals: Good ADL Goals Pt Will Perform Lower Body Bathing: with min assist;with adaptive equipment;sit to/from stand Pt Will Perform Lower Body Dressing: with min assist;sit to/from stand;with adaptive equipment Pt Will Transfer to Toilet: with min assist;bedside commode;stand pivot transfer Pt Will Perform Toileting - Clothing Manipulation and hygiene: with min assist;with adaptive equipment;sitting/lateral leans;sit to/from stand Pt/caregiver will Perform Home Exercise Program: Increased strength;Both right and left upper extremity;With theraband;With written HEP provided;With Supervision Additional ADL Goal #1: Pt will independently verbalzie 3 energy conservation strategies  Plan Discharge plan remains appropriate     Co-evaluation                 AM-PAC OT "6 Clicks" Daily Activity     Outcome Measure   Help from another person eating meals?: None Help from another person taking care of personal grooming?: A Little Help from another person toileting, which includes using toliet, bedpan, or urinal?: A Lot Help from another person bathing (including washing, rinsing, drying)?: A Lot Help from another person to put on and taking off regular upper body clothing?: A Little Help from another person to put on and taking off regular lower body clothing?: A Lot 6 Click Score: 16    End of Session Equipment Utilized During Treatment: Rolling walker  OT Visit Diagnosis: Other abnormalities of gait and mobility (R26.89);Muscle weakness (generalized) (M62.81);Other symptoms and signs involving cognitive function;Pain Pain - Right/Left: Left Pain - part of body: Knee   Activity Tolerance Patient tolerated treatment well   Patient Left in chair;with call bell/phone within reach   Nurse Communication Mobility status;Other (comment) (need to practice sit - stand/use BSC)        Time: 7425-9563 OT Time Calculation (min): 30 min  Charges: OT General Charges $OT Visit: 1 Visit OT Treatments $Self Care/Home Management : 23-37 mins  Maurie Boettcher, OT/L   Acute OT Clinical Specialist [place] Pager [phone] Office [phone]    [facility] 06/27/2020, 6:39 PM

## 2020-06-27 NOTE — Progress Notes (Signed)
SATURATION QUALIFICATIONS: (This note is used to comply with regulatory documentation for home oxygen)  Patient Saturations on Room Air at Rest = 95%  Patient Saturations on Room Air while Ambulating = 92%  Patient Saturations on 0 Liters of oxygen while Ambulating = 92%  Please briefly explain why patient needs home oxygen: 

## 2020-06-28 ENCOUNTER — Inpatient Hospital Stay (HOSPITAL_COMMUNITY): Payer: Medicare Other

## 2020-06-28 DIAGNOSIS — U071 COVID-19: Secondary | ICD-10-CM

## 2020-06-28 DIAGNOSIS — R7989 Other specified abnormal findings of blood chemistry: Secondary | ICD-10-CM

## 2020-06-28 LAB — CULTURE, BLOOD (ROUTINE X 2)
Culture: NO GROWTH
Culture: NO GROWTH
Special Requests: ADEQUATE
Special Requests: ADEQUATE

## 2020-06-28 LAB — CBC WITH DIFFERENTIAL/PLATELET
Abs Immature Granulocytes: 0.01 10*3/uL (ref 0.00–0.07)
Basophils Absolute: 0 10*3/uL (ref 0.0–0.1)
Basophils Relative: 0 %
Eosinophils Absolute: 0 10*3/uL (ref 0.0–0.5)
Eosinophils Relative: 0 %
HCT: 48.4 % — ABNORMAL HIGH (ref 36.0–46.0)
Hemoglobin: 15.6 g/dL — ABNORMAL HIGH (ref 12.0–15.0)
Immature Granulocytes: 0 %
Lymphocytes Relative: 27 %
Lymphs Abs: 1.5 10*3/uL (ref 0.7–4.0)
MCH: 30 pg (ref 26.0–34.0)
MCHC: 32.2 g/dL (ref 30.0–36.0)
MCV: 93.1 fL (ref 80.0–100.0)
Monocytes Absolute: 0.4 10*3/uL (ref 0.1–1.0)
Monocytes Relative: 7 %
Neutro Abs: 3.5 10*3/uL (ref 1.7–7.7)
Neutrophils Relative %: 66 %
Platelets: 218 10*3/uL (ref 150–400)
RBC: 5.2 MIL/uL — ABNORMAL HIGH (ref 3.87–5.11)
RDW: 12.5 % (ref 11.5–15.5)
WBC: 5.4 10*3/uL (ref 4.0–10.5)
nRBC: 0 % (ref 0.0–0.2)

## 2020-06-28 LAB — C-REACTIVE PROTEIN: CRP: 1.1 mg/dL — ABNORMAL HIGH (ref <1.0)

## 2020-06-28 LAB — D-DIMER, QUANTITATIVE: D-Dimer, Quant: 1.13 ug/mL-FEU — ABNORMAL HIGH (ref 0.00–0.50)

## 2020-06-28 LAB — COMPREHENSIVE METABOLIC PANEL
ALT: 19 U/L (ref 0–44)
AST: 30 U/L (ref 15–41)
Albumin: 2.9 g/dL — ABNORMAL LOW (ref 3.5–5.0)
Alkaline Phosphatase: 64 U/L (ref 38–126)
Anion gap: 12 (ref 5–15)
BUN: 18 mg/dL (ref 8–23)
CO2: 28 mmol/L (ref 22–32)
Calcium: 8.8 mg/dL — ABNORMAL LOW (ref 8.9–10.3)
Chloride: 96 mmol/L — ABNORMAL LOW (ref 98–111)
Creatinine, Ser: 0.71 mg/dL (ref 0.44–1.00)
GFR calc Af Amer: >60 mL/min (ref >60)
GFR calc non Af Amer: >60 mL/min (ref >60)
Glucose, Bld: 149 mg/dL — ABNORMAL HIGH (ref 70–99)
Potassium: 4.1 mmol/L (ref 3.5–5.1)
Sodium: 136 mmol/L (ref 135–145)
Total Bilirubin: 0.9 mg/dL (ref 0.3–1.2)
Total Protein: 6.2 g/dL — ABNORMAL LOW (ref 6.5–8.1)

## 2020-06-28 LAB — FERRITIN: Ferritin: 110 ng/mL (ref 11–307)

## 2020-06-28 LAB — GLUCOSE, CAPILLARY
Glucose-Capillary: 117 mg/dL — ABNORMAL HIGH (ref 70–99)
Glucose-Capillary: 193 mg/dL — ABNORMAL HIGH (ref 70–99)
Glucose-Capillary: 128 mg/dL — ABNORMAL HIGH (ref 70–99)

## 2020-06-28 MED ORDER — ENOXAPARIN SODIUM 60 MG/0.6ML ~~LOC~~ SOLN: 60.0000 mg | SUBCUTANEOUS | Status: DC

## 2020-06-28 MED ORDER — DEXAMETHASONE 6 MG PO TABS: 6.0000 mg | ORAL_TABLET | Freq: Every day | ORAL | 0 refills | Status: AC

## 2020-06-28 NOTE — TOC Progression Note (Addendum)
Transition of Care [facility]) - Progression Note    Patient Details  Name: Carol Martin MRN: [id number] Date of Birth: Jul 24, 1941  Transition [facility]) CM/SW [place], RN Phone Number: 06/28/2020, 9:15 AM  Clinical Narrative:    Spoke to Jonetta Speak, [facility] all do not have staffing to accept. [facility], but will not have availability until the 31st.  , Asked MD for [facility] orders and wheelchair order on recommendation of OT PT. Awaiting orders. Spoke to daughter over phone. She will have someone ready to pick patient up should she be discharged today.  OT PT and H&P [facility] to [facility]   Expected Discharge Plan: [place] Barriers to Discharge: Continued Medical Work up  Expected Discharge Plan and Services Expected Discharge Plan: [place]                                               Social Determinants of Health (SDOH) Interventions    Readmission Risk Interventions No flowsheet data found.

## 2020-06-28 NOTE — Progress Notes (Signed)
Physical Therapy Treatment Patient Details Name: Carol Martin MRN: [id number] DOB: 10-Feb-1941 Today's Date: 06/28/2020    History of Present Illness Pt is a 79 y/o female admitted secondary to fall and COVID. Pt was placed on 2L secondary to desaturation upon admission. PMH includes breast CA, dementia, HTN, and dCHF.    PT Comments    Pt progressing with mobility. Requires varying amount of assist standing from different surface heights, but reports daughter has to help boost her to stand at home. Ambulatory in room with bariatric rollator (which pt uses at baseline) and min guard. Limited mobility secondary to generalized weakness, decreased activity tolerance and SOB. Difficult getting reliable pulse ox reading. Per chart, daughter declining SNF, plans to take daughter home with support.    Follow Up Recommendations  Home health PT;Supervision/Assistance - 24 hour (daughter declined SNF)     Equipment Recommendations  Wheelchair (measurements PT);Wheelchair cushion (measurements PT) (bariatric-sized)    Recommendations for Other Services       Precautions / Restrictions Precautions Precautions: Fall;Other (comment) Precaution Comments: Unable to get pulse ox read Restrictions Weight Bearing Restrictions: No    Mobility  Bed Mobility Overal bed mobility: Needs Assistance Bed Mobility: Supine to Sit     Supine to sit: Supervision;HOB elevated     General bed mobility comments: No assist required to come to EOB  Transfers Overall transfer level: Needs assistance Equipment used: 2 person hand held assist Transfers: Sit to/from Stand Sit to Stand: Mod assist;+2 physical assistance;Min guard         General transfer comment: ModA+2 for bilateral HHA to pull into standing from EOB (pt reports this is how daughter assists at home); min guard to stand from [facility] over toilet with pt utilizing grab bar to pull to stand  Ambulation/Gait Ambulation/Gait assistance: Min  guard;+2 safety/equipment Gait Distance (Feet): 30 Feet Assistive device: 4-wheeled walker Gait Pattern/deviations: Step-to pattern;Wide base of support;Trunk flexed Gait velocity: Decreased   General Gait Details: Slow gait with wide BOS, rollator and min guard for balance; seated rest to void on toilet   Stairs             Wheelchair Mobility    Modified Rankin (Stroke Patients Only)       Balance Overall balance assessment: Needs assistance Sitting-balance support: No upper extremity supported;Feet supported Sitting balance-Leahy Scale: Fair     Standing balance support: Bilateral upper extremity supported;During functional activity Standing balance-Leahy Scale: Poor Standing balance comment: Reliant on single UE support when performing standing pericare/washup                            Cognition Arousal/Alertness: Awake/alert Behavior During Therapy: WFL for tasks assessed/performed Overall Cognitive Status: History of cognitive impairments - at baseline                                 General Comments: Dementia at baseline. Noted slow processing      Exercises      General Comments General comments (skin integrity, edema, etc.): SOB with exertion walking and performing ADL tasks      Pertinent Vitals/Pain Pain Assessment: Faces Faces Pain Scale: Hurts a little bit Pain Location: Knees Pain Descriptors / Indicators: Discomfort Pain Intervention(s): Monitored during session    Home Living  Prior Function            PT Goals (current goals can now be found in the care plan section) Acute Rehab PT Goals Patient Stated Goal: to go home Progress towards PT goals: Progressing toward goals    Frequency    Min 3X/week      PT Plan Discharge plan needs to be updated;Frequency needs to be updated    Co-evaluation PT/OT/SLP Co-Evaluation/Treatment: Yes Reason for Co-Treatment: For  patient/therapist safety;To address functional/ADL transfers PT goals addressed during session: Mobility/safety with mobility;Balance;Proper use of DME        AM-PAC PT "6 Clicks" Mobility   Outcome Measure  Help needed turning from your back to your side while in a flat bed without using bedrails?: A Little Help needed moving from lying on your back to sitting on the side of a flat bed without using bedrails?: A Little Help needed moving to and from a bed to a chair (including a wheelchair)?: A Lot Help needed standing up from a chair using your arms (e.g., wheelchair or bedside chair)?: A Lot Help needed to walk in hospital room?: A Little Help needed climbing 3-5 steps with a railing? : A Lot 6 Click Score: 15    End of Session   Activity Tolerance: Patient tolerated treatment well Patient left: in chair;with call bell/phone within reach Nurse Communication: Mobility status PT Visit Diagnosis: Unsteadiness on feet (R26.81);Repeated falls (R29.6);Muscle weakness (generalized) (M62.81);History of falling (Z91.81)     Time: 0813-8871 PT Time Calculation (min) (ACUTE ONLY): 31 min  Charges:  $Therapeutic Activity: 8-22 mins                     Mabeline Caras, PT, DPT Acute Rehabilitation Services  Pager [phone] Office [place] 06/28/2020, 9:29 AM

## 2020-06-28 NOTE — Progress Notes (Signed)
Patient was discharged home with home health by MD order; discharged instructions review and give to patient and her daughter with care notes; IV DIC; wheelchair with cushion and 3:1 BSC were delivered to patient's room. Patient will be escorted to the car by nurse tech via wheelchair.

## 2020-06-28 NOTE — TOC Transition Note (Addendum)
Transition of Care [facility]) - CM/SW Discharge Note   Patient Details  Name: Carol Martin MRN: [id number] Date of Birth: [birth year]  Transition of Care [facility] CM/SW Contact:  [name], RN Phone Number: 06/28/2020, 1:26 PM   Clinical Narrative:     DME 3:1 and wheelchair with cushion ordered from [employer]. Will be delivering it to the room today. [facility] faxed over the order for [place] attn [name]. [phone]. They stated they had received all other notes.  Called daughter [name]. Patient will be staying with daughter [name] number [phone]. [facility] made aware of street address and this phone number for [name].  [name] will be the one picking up patient though call her when patient ready for discharge and has all DME. She is aware that [facility] cannot come see patient until Monday or Tuesday.   Final next level of care: [place] Barriers to Discharge: No Barriers Identified   Patient Goals and CMS Choice     Choice offered to / list presented to : Patient, Adult Children  Discharge Placement                       Discharge Plan and Services                DME Arranged: 3-N-1 DME Agency: [employer] Date DME Agency Contacted: 06/28/20 Time DME Agency Contacted: 1300 Representative spoke with at DME Agency: [name]: PT, OT, Nurse's Aide [name] Agency: [facility] [employer] Date [place]: 06/28/20 Time [place]: 1000 Representative spoke with at [place]: [name] ([place]) Interventions     Readmission Risk Interventions No flowsheet data found.

## 2020-06-28 NOTE — Discharge Summary (Signed)
Physician Discharge Summary  Carol Martin WRU:[id number] DOB: 08-23-1941 DOA: 06/23/2020  PCP: Nolene Ebbs, MD  Admit date: 06/23/2020 Discharge date: 06/28/2020  Time spent: 40 minutes  Recommendations for Outpatient Follow-up:  1. Follow outpatient CBC/CMP 2. Continue quarantine per CDC guidelines (September 11) 3. Follow with neurology outpatient - resumed keppra she'd previously discontinued  4. Follow abnormal EEG with neurology outpatient - keppra resumed  5. Follow with PCP thiazide and loop diuretic, family notes torsemide is only prn   Discharge Diagnoses:  Active Problems:   Dyspnea   Hypertension   Alzheimer's disease (HCC)   Chronic diastolic CHF (congestive heart failure) (HCC)   Chronic pain of both knees   COVID-19   Discharge Condition: stable  Diet recommendation: heart healthy  Filed Weights   06/26/20 1501  Weight: 127.1 kg    History of present illness:  79 year old female with history of hypertension, GERD, chronic diastolic heart failure, history of seizures not on medications, mild Alzheimer's disease and arthritis, history of breast cancer presented with encephalopathy. Patient has exposure to her sick daughter with COVID-19 last Monday. She was found fallen from the bed and confused. In the emergency room, she was febrile and mildly tachycardic. Elevated inflammatory markers. COVID-19 positive.  She was admitted with COVID pneumonia.  She's improved on steroids and remdesivir.  She's now being discharged on steroids.  Concern for seizure at presentation, EEG with epileptogenic focus.  Started back on keppra, which was previously prescribed, but she hadn't taken in several months.    See below for additional details   Hospital Course:  COVID-19 viral infection with hypoxia: CXR with vascular congestion without overt edema Improving, doing well on RA, ambulated without desaturation 8/25 remedsivir completed 8/25.  Continue steroids at  discharge. Afebrile, WBC improving   Improving inflammatory labs - elevated d dimer (negative [place] for DVT) Strict I/O, daily weights Prone as able, OOB, flutter, IS Blood and urine cx NGTD  COVID-19 Labs  Recent Labs    06/26/20 0500 06/27/20 0958 06/28/20 0351  DDIMER 0.88* 0.61* 1.13*  FERRITIN 134 137 110  CRP 4.1* 1.8* 1.1*    Lab Results  Component Value Date   SARSCOV2NAA POSITIVE (Justice Aguirre) 06/23/2020    Hypertension: Blood pressure stable on lisinopril hydrochlorothiazide and verapamil. Continue. Pt on loop diuretic and thiazide -> only takes loop prn, infrequently, discussed need to discuss with PCP  Chronic diastolic congestive heart failure: Euvolemic. On torsemide, hydrochlorothiazide and potassium that is continued. Again, will need to discuss loop + thiazide prior to d/c (discussed with daughter, should discuss with PCP)  History of seizure: Seizure precautions. Not on treatment. Some concern that she could have had Freedom Lopezperez seizure leading to fall and post ictal state. Previously resistant to treatment. Now she is okay to start treatment. 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 She was prescribed Keppra 500 mg twice Kaelum Kissick day in the past but never took it. Encouraged compliance with keppra Needs neurology follow up Discussed with daughter Seizure precautions  Procedures: LE [place] Summary:  RIGHT:  - There is no evidence of deep vein thrombosis in the lower extremity.    - No cystic structure found in the popliteal fossa.    LEFT:  - There is no evidence of deep vein thrombosis in the lower extremity.    - No cystic structure found in the popliteal fossa.   Consultations:  none  Discharge Exam: Vitals:   06/28/20 1300 06/28/20 1456  BP: (!) 141/76  127/62  Pulse:  67  Resp: 19 18  Temp: 98.6 F (37 C) 98.3 F (36.8 C)  SpO2: 95% 100%   No new complaints, feeling better overall  Comfortable with d/c today  General: No acute  distress. Cardiovascular: RRR Lungs: Clear to auscultation bilaterally  Abdomen: Soft, nontender, nondistended  Neurological: Alert and oriented 3. Moves all extremities 4. Cranial nerves II through XII grossly intact. Skin: Warm and dry. No rashes or lesions. Extremities: No clubbing or cyanosis. No edema.   Discharge Instructions   Discharge Instructions    Call MD for:  difficulty breathing, headache or visual disturbances   Complete by: As directed    Call MD for:  extreme fatigue   Complete by: As directed    Call MD for:  hives   Complete by: As directed    Call MD for:  persistant dizziness or light-headedness   Complete by: As directed    Call MD for:  persistant nausea and vomiting   Complete by: As directed    Call MD for:  redness, tenderness, or signs of infection (pain, swelling, redness, odor or green/yellow discharge around incision site)   Complete by: As directed    Call MD for:  severe uncontrolled pain   Complete by: As directed    Call MD for:  temperature >100.4   Complete by: As directed    Diet - low sodium heart healthy   Complete by: As directed    Discharge instructions   Complete by: As directed    You were seen for COVID pneumonia.  You've improved with steroids and remdesivir.  We'll send you home with steroids to complete Abrian Hanover 10 day course.   You should continue to quarantine for 21 days from your positive test.  Your quarantine can be discontinued after September 11th if you continue to improve without the need for tylenol or other fever reducing medicines.  Please take your keppra and follow up with neurology outpatient.  Avoid situations which could be dangerous if you were to have Worth Kober seizure.   Please follow up with your PCP regarding your diuretics (HCTZ and torsemide).  You can have complications sometimes if you take these together.   Return for new, recurrent, or worsening symptoms.  Please ask your PCP to request records from this  hospitalization so they know what was done and what the next steps will be.   Increase activity slowly   Complete by: As directed    Wheelchair   Complete by: As directed    Bariatric sized     Allergies as of 06/28/2020   No Known Allergies     Medication List    STOP taking these medications   naproxen sodium 220 MG tablet Commonly known as: ALEVE     TAKE these medications   acetaminophen 500 MG tablet Commonly known as: TYLENOL Take 1,000 mg by mouth daily as needed for mild pain.   Artificial Tears 1.4 % ophthalmic solution Generic drug: polyvinyl alcohol Place 1 drop into both eyes daily as needed for dry eyes.   dexamethasone 6 MG tablet Commonly known as: Decadron Take 1 tablet (6 mg total) by mouth daily for 5 days.   levETIRAcetam 500 MG tablet Commonly known as: KEPPRA Take 500 mg by mouth 2 (two) times daily.   lisinopril-hydrochlorothiazide 20-12.5 MG tablet Commonly known as: ZESTORETIC Take 1 tablet by mouth daily.   loratadine 10 MG tablet Commonly known as: CLARITIN Take 10 mg by mouth **Note De-Identified vi Obfusction** dily.   multivitmin with minerls Tbs tblet Tke 1 tblet by mouth dily.   nysttin powder Generic drug: nysttin pply 1 ppliction topiclly dily s needed (itching/rsh).   potssium chloride 10 MEQ tblet Commonly known s: KLOR-CON Tke 10 mEq by mouth dily s needed (with ech dose of torsemide).   torsemide 20 MG tblet Commonly known s: DEMDEX Tke 20-40 mg by mouth dily s needed (leg swelling).   trimcinolone 0.025 % crem Commonly known s: KENLOG pply 1 ppliction topiclly dily s needed (rsh/itching).   verpmil 180 MG CR tblet Commonly known s: CLN-SR Tke 180 mg by mouth dily.   Vitmin D (Ergoclciferol) 1.25 MG (50000 UNIT) Cps cpsule Commonly known s: DRISDOL Tke 50,000 Units by mouth every Sundy.            [facility] Equipment  (From dmission, onwrd)         Strt     Ordered    06/28/20 1316  For home use only DME lightweight mnul wheelchir with set cushion  Once       Comments: Ptient suffers from  wekness which impirs their bility to perform dily ctivities like DLs  in the home.    wlker  will not resolve  issue with performing ctivities of dily living.  wheelchir will llow ptient to sfely perform dily ctivities. Ptient is not ble to propel themselves in the home using  stndrd weight wheelchir due to wekness . Ptient cn self propel in the lightweight wheelchir. Length of need  is 12 months  ccessories: elevting leg rests (ELRs), wheel locks, extensions nd nti-tippers.   06/28/20 1316   06/28/20 1256  DME 3-in-1  Once        08 /26/21 1258         No Known llergies  Follow-up Informtion    [facility]lc, [facility] Follow up.   Contct informtion: [address] [address] [phone]        [facility] Follow up.   Contct informtion:  [facility] [phone]               The results of significnt dignostics from this hospitliztion (including imging, microbiology, ncillry nd lbortory) re listed below for reference.    Significnt Dignostic Studies: CT Hed Wo Contrst  Result Dte: 06/23/2020 CLINICL DT:  Sttus post fll.  ltered mentl sttus. EXM: CT HED WITHOUT CONTRST TECHNIQUE: Contiguous xil imges were obtined from the bse of the skull through the vertex without intrvenous contrst. COMPRISON:  Hed CT 05/23/2014.  Brin MRI 06/12/2014. FINDINGS: Brin: No evidence of cute infrction, hemorrhge, hydrocephlus, extr-xil collection or mss lesion/mss effect. Empty sell is unchnged. Vsculr: therosclerosis noted. Skull: Intct.  No focl lesion. Sinuses/Orbits: Orbits nd prnsl sinuses re unremrkble. Chronic bilterl mstoid effusions re unchnged. Other: None. IMPRESSION: No cute bnormlity. Chronic bilterl mstoid  effusions. Electroniclly Signed   By: Inge Rise M.D.   On: 06/23/2020 18:17   DG Chest Portble 1 View  Result Dte: 06/23/2020 CLINICL DT:  [place], ltered level of consciousness EXM: PORTBLE CHEST 1 VIEW COMPRISON:  None. FINDINGS: Single frontl view of the chest demonstrtes n enlrged crdic silhouette. There is ectsi of the thorcic ort. There is bckground vsculr congestion, with scttered liner opcities t the lung bses consistent with scrring or telectsis. Eventrtion of the left hemidiphrgm. No irspce disese, effusion, or pneumothorx. IMPRESSION: 1. Vsculr congestion without overt edem. 2. Scttered res of scrring  or subsegmental atelectasis. Electronically Signed   By: Randa Ngo M.D.   On: 06/23/2020 18:21   EEG adult  Result Date: 06/25/2020 Lora Havens, MD     06/25/2020  5:08 PM Patient Name: HARLYN RATHMANN MRN: [id number] Epilepsy Attending: Lora Havens Referring Physician/Provider: Dr Gilles Chiquito Date: 06/25/2020 Duration: 24.57 mins Patient history: 78yo F with h/o seizure presented with ams. EEG to evaluate for seizure. Level of alertness: Awake, asleep AEDs during EEG study: None Technical aspects: This EEG study was done with scalp electrodes positioned according to the 10-20 International system of electrode placement. Electrical activity was acquired at Meena Barrantes sampling rate of 500Hz  and reviewed with Kellin Bartling high frequency filter of 70Hz  and Sedale Jenifer low frequency filter of 1Hz . EEG data were recorded continuously and digitally stored. Description: The posterior dominant rhythm consists of 8 Hz activity of moderate voltage (25-35 uV) seen predominantly in posterior head regions, symmetric and reactive to eye opening and eye closing. Sleep was characterized by vertex waves, sleep spindles (12 to 14 Hz), maximal frontocentral region.  EEG showed intermittent left> right temporal 3 to 6 Hz theta-delta slowing.  Spike was also seen in left temporal region  as well as sharp transients in right temporal region. Hyperventilation and photic stimulation were not performed.   ABNORMALITY -Spike, left temporal region -Intermittent slow, left> right temporal region IMPRESSION: This study showed evidence of potential epileptogenicity arising from left temporal region as well as non specific cortical dysfunction in left> right temporal region. No seizures were seen throughout the recording. Priyanka O Yadav   VAS [place] LOWER EXTREMITY VENOUS (DVT)  Result Date: 06/28/2020  Lower Venous DVTStudy Indications: Elevated D dimer, + covid.  Risk Factors: Obesity. Performing Technologist: Griffin Basil RCT RDMS  Examination Guidelines: Naysha Sholl complete evaluation includes B-mode imaging, spectral Doppler, color Doppler, and power Doppler as needed of all accessible portions of each vessel. Bilateral testing is considered an integral part of Sangeeta Youse complete examination. Limited examinations for reoccurring indications may be performed as noted. The reflux portion of the exam is performed with the patient in reverse Trendelenburg.  +---------+---------------+---------+-----------+----------+------------------+ RIGHT    CompressibilityPhasicitySpontaneityPropertiesThrombus Aging     +---------+---------------+---------+-----------+----------+------------------+ CFV      Full           Yes      Yes                                     +---------+---------------+---------+-----------+----------+------------------+ SFJ      Full                                                            +---------+---------------+---------+-----------+----------+------------------+ FV Prox  Full                                                            +---------+---------------+---------+-----------+----------+------------------+ FV Mid   Full                                                            +---------+---------------+---------+-----------+----------+------------------+  FV DistalFull                                                            +---------+---------------+---------+-----------+----------+------------------+ PFV      Full                                                            +---------+---------------+---------+-----------+----------+------------------+ POP      Full           Yes      Yes                                     +---------+---------------+---------+-----------+----------+------------------+ PTV      Full                                                            +---------+---------------+---------+-----------+----------+------------------+ PERO                                                  Limited                                                                  visualization      +---------+---------------+---------+-----------+----------+------------------+   +---------+---------------+---------+-----------+----------+------------------+ LEFT     CompressibilityPhasicitySpontaneityPropertiesThrombus Aging     +---------+---------------+---------+-----------+----------+------------------+ CFV      Full           Yes      Yes                                     +---------+---------------+---------+-----------+----------+------------------+ SFJ      Full                                                            +---------+---------------+---------+-----------+----------+------------------+ FV Prox  Full                                                            +---------+---------------+---------+-----------+----------+------------------+ FV Mid   Full                                                            +---------+---------------+---------+-----------+----------+------------------+  FV DistalFull                                                            +---------+---------------+---------+-----------+----------+------------------+ PFV      Full                                                             +---------+---------------+---------+-----------+----------+------------------+ POP      Full           Yes      Yes                                     +---------+---------------+---------+-----------+----------+------------------+ PTV      Full                                                            +---------+---------------+---------+-----------+----------+------------------+ PERO                                                  Limted                                                                   visualization      +---------+---------------+---------+-----------+----------+------------------+     Summary: RIGHT: - There is no evidence of deep vein thrombosis in the lower extremity.  - No cystic structure found in the popliteal fossa.  LEFT: - There is no evidence of deep vein thrombosis in the lower extremity.  - No cystic structure found in the popliteal fossa.  *See table(s) above for measurements and observations. Electronically signed by Monica Martinez MD on 06/28/2020 at 3:25:44 PM.    Final     Microbiology: Recent Results (from the past 240 hour(s))  Urine culture     Status: None   Collection Time: 06/23/20  5:05 PM   Specimen: Urine, Catheterized  Result Value Ref Range Status   Specimen Description URINE, CATHETERIZED  Final   Special Requests NONE  Final   Culture   Final    NO GROWTH Performed at [facility], [address] [place], [place] [address]    Report Status 06/24/2020 FINAL  Final  SARS Coronavirus 2 by RT PCR (hospital order, performed in [facility] hospital lab) Nasopharyngeal Nasopharyngeal Swab     Status: Abnormal   Collection Time: 06/23/20  5:07 PM   Specimen: Nasopharyngeal Swab  Result Value Ref Range Status   SARS Coronavirus 2 POSITIVE (Danny Yackley) NEGATIVE Final  Comment: RESULT CALLED TO, READ BACK BY AND VERIFIED WITH: RN Lelon Huh [phone] FCP (NOTE) SARS-CoV-2  target nucleic acids are DETECTED  SARS-CoV-2 RNA is generally detectable in upper respiratory specimens  during the acute phase of infection.  Positive results are indicative  of the presence of the identified virus, but do not rule out bacterial infection or co-infection with other pathogens not detected by the test.  Clinical correlation with patient history and  other diagnostic information is necessary to determine patient infection status.  The expected result is negative.  Fact Sheet for Patients:   StrictlyIdeas.no   Fact Sheet for Healthcare Providers:   BankingDealers.co.za    This test is not yet approved or cleared by the [place] FDA and  has been authorized for detection and/or diagnosis of SARS-CoV-2 by FDA under an Emergency Use Authorization (EUA).  This EUA will remain in effect (meaning this test can b e used) for the duration of  the COVID-19 declaration under Section 564(b)(1) of the Act, 21 U.S.C. section 360-bbb-3(b)(1), unless the authorization is terminated or revoked sooner.  Performed at [facility], [place] [address] [place], [place] [address]   Blood culture (routine x 2)     Status: None   Collection Time: 06/23/20  5:18 PM   Specimen: BLOOD  Result Value Ref Range Status   Specimen Description BLOOD RIGHT ANTECUBITAL  Final   Special Requests   Final    BOTTLES DRAWN AEROBIC AND ANAEROBIC Blood Culture adequate volume   Culture   Final    NO GROWTH 5 DAYS Performed at [facility], [place] [address] [place], [place] [address]    Report Status 06/28/2020 FINAL  Final  Blood culture (routine x 2)     Status: None   Collection Time: 06/23/20  5:19 PM   Specimen: BLOOD  Result Value Ref Range Status   Specimen Description BLOOD SITE NOT SPECIFIED  Final   Special Requests   Final    BOTTLES DRAWN AEROBIC AND ANAEROBIC Blood Culture adequate volume   Culture   Final    NO GROWTH 5  DAYS Performed at [facility], [place] [address], [place], [place] [address]    Report Status 06/28/2020 FINAL  Final     Labs: Basic Metabolic Panel: Recent Labs  Lab 06/23/20 1717 06/24/20 0114 06/24/20 0450 06/25/20 0611 06/26/20 0500 06/27/20 0958 06/28/20 0351  NA 140  --  140 138 139 139 136  K 3.6  --  3.9 3.7 3.6 3.2* 4.1  CL 103  --  105 103 100 99 96*  CO2 24  --  25 24 28 25 28   GLUCOSE 119*  --  135* 143* 142* 97 149*  BUN 7*  --  8 12 14 15 18   CREATININE 0.84   < > 0.70 0.68 0.68 0.74 0.71  CALCIUM 8.9  --  8.6* 8.4* 8.5* 8.6* 8.8*  MG 1.9  --   --   --   --   --   --    < > = values in this interval not displayed.   Liver Function Tests: Recent Labs  Lab 06/24/20 0450 06/25/20 0611 06/26/20 0500 06/27/20 0958 06/28/20 0351  AST 28 47* 43* 40 30  ALT 9 15 15 19 19   ALKPHOS 82 67 67 66 64  BILITOT 0.9 0.8 0.6 0.8 0.9  PROT 6.2* 5.9* 6.1* 6.5 6.2*  ALBUMIN 2.9* 2.8* 2.7* 3.0* 2.9*  Recent Labs  Lab 06/23/20 1717  LIPASE 19   Recent Labs  Lab 06/23/20 1732  AMMONIA 11   CBC: Recent Labs  Lab 06/24/20 0450 06/25/20 0611 06/26/20 0500 06/27/20 0958 06/28/20 1056  WBC 7.5 4.2 4.6 3.9* 5.4  NEUTROABS 6.6 3.2 3.4 2.6 3.5  HGB 14.2 13.5 13.9 14.8 15.6*  HCT 43.4 42.6 43.6 46.8* 48.4*  MCV 96.0 95.5 95.0 96.5 93.1  PLT 162 152 168 200 218   Cardiac Enzymes: No results for input(s): CKTOTAL, CKMB, CKMBINDEX, TROPONINI in the last 168 hours. BNP: BNP (last 3 results) Recent Labs    06/23/20 1717  BNP 105.9*    ProBNP (last 3 results) No results for input(s): PROBNP in the last 8760 hours.  CBG: Recent Labs  Lab 06/27/20 1605 06/27/20 2122 06/28/20 0814 06/28/20 1142 06/28/20 1620  GLUCAP 80 119* 117* 193* 128*       Signed:  Fayrene Helper MD.  [facility] 06/28/2020, 5:28 PM

## 2020-06-28 NOTE — Progress Notes (Signed)
Occupational Therapy Treatment Patient Details Name: Carol Martin MRN: [id number] DOB: Apr 17, 1941 Today's Date: 06/28/2020    History of present illness Pt is a 79 y/o female admitted secondary to fall and COVID. Pt was placed on 2L secondary to desaturation upon admission. PMH includes breast CA, dementia, HTN, and dCHF.    OT comments  Pt seen for OT follow up session to focus on BADL mobility progression and AE education. Pt completed bed mobility with supervision assist this date. She requires mod A +2 to sit <> stand from EOB with bar rollator (what she uses at home).  Pt was able to complete functional mobility to bathroom for toilet transfer with min guard assist. With bari BSC placed over top of toilet, pt able to complete toilet transfer with min guard assist and use of grab bars. Pt then engaged in LB, pannus, and peri area hygiene at set up > min A level. Educated pt on safe ways to maintain skin integrity with pannus. Issued pt long handled sponge which helped improve pt independence in full body bathing. Then educated pt on sock aide, reacher, and long handled shoe horn for increased independence with LB dressing. Continue to recommend w/c for home to better support long distances. D/c recs are appropriate for HHOT. Will continue to follow while acute.    Follow Up Recommendations  Home health OT;Supervision/Assistance - 24 hour    Equipment Recommendations  3 in 1 bedside commode;Wheelchair (measurements OT);Wheelchair cushion (measurements OT) (28 in w/c ok)    Recommendations for Other Services      Precautions / Restrictions Precautions Precautions: Fall;Other (comment) Precaution Comments: Unable to get pulse ox read Restrictions Weight Bearing Restrictions: No       Mobility Bed Mobility Overal bed mobility: Needs Assistance Bed Mobility: Supine to Sit     Supine to sit: Supervision;HOB elevated     General bed mobility comments: No assist required to come to  EOB  Transfers Overall transfer level: Needs assistance Equipment used: 2 person hand held assist Transfers: Sit to/from Stand Sit to Stand: Mod assist;+2 physical assistance;Min guard         General transfer comment: ModA+2 for bilateral HHA to pull into standing from EOB (pt reports this is how daughter assists at home); min guard to stand from [facility] over toilet with pt utilizing grab bar to pull to stand    Balance Overall balance assessment: Needs assistance Sitting-balance support: No upper extremity supported;Feet supported Sitting balance-Leahy Scale: Fair     Standing balance support: Bilateral upper extremity supported;During functional activity Standing balance-Leahy Scale: Poor Standing balance comment: Reliant on single UE support when performing standing pericare/washup, leans over with forearms on rollator                           ADL either performed or assessed with clinical judgement   ADL Overall ADL's : Needs assistance/impaired             Lower Body Bathing: Minimal assistance;Sitting/lateral leans;Sit to/from stand Lower Body Bathing Details (indicate cue type and reason): pt able to bathe pannus area and tops of thighs- requires min A for thorough task completion and to reach feet. Issued pt long handled sponge with improvement of these tasks. Educated pt on safe pannus hygiene     Lower Body Dressing: Minimal assistance;Sit to/from stand;Sitting/lateral leans;With adaptive equipment Lower Body Dressing Details (indicate cue type and reason): educated pt on sock aide, reacher,  and shoe horn to improve LB dressing skills. Pt return demo'd in session Toilet Transfer: Min guard;Ambulation;Comfort height toilet;Grab bars Toilet Transfer Details (indicate cue type and reason): bariatric BSC placed over top of toilet for added leverage. Pt was able to stand up from commode set up with slow, controlled movement with heavy reliance on rollator and  grab bars Toileting- Clothing Manipulation and Hygiene: Minimal assistance;Sitting/lateral lean;Sit to/from stand Toileting - Clothing Manipulation Details (indicate cue type and reason): pt able to engage in pannus hygiene while seated anteriorly and posteriorly. Noted slight skin sloughing in folds. Educated pt on appropriate hygiene and drying to maintain skin at home. Pt performed this action best while seated and leaning to manage body habitus     Functional mobility during ADLs: Min guard;Rolling walker General ADL Comments: pt able to progress in functional mobility and BADL engagement this date from original eval     Vision Baseline Vision/History: Wears glasses Wears Glasses: At all times Patient Visual Report: No change from baseline     Perception     Praxis      Cognition Arousal/Alertness: Awake/alert Behavior During Therapy: WFL for tasks assessed/performed Overall Cognitive Status: History of cognitive impairments - at baseline (per chart review mild dementia at baseline) Area of Impairment: Problem solving                             Problem Solving: Slow processing;Requires verbal cues General Comments: increased time to process with added cues for basic tasks        Exercises     Shoulder Instructions       General Comments SOB with exertion walking and performing ADL tasks    Pertinent Vitals/ Pain       Pain Assessment: Faces Faces Pain Scale: Hurts a little bit Pain Location: Knees Pain Descriptors / Indicators: Discomfort Pain Intervention(s): Monitored during session  Home Living                                          Prior Functioning/Environment              Frequency  Min 2X/week        Progress Toward Goals  OT Goals(current goals can now be found in the care plan section)  Progress towards OT goals: Progressing toward goals  Acute Rehab OT Goals Patient Stated Goal: to go home OT Goal  Formulation: With patient Time For Goal Achievement: 07/09/20 Potential to Achieve Goals: Good  Plan Discharge plan remains appropriate    Co-evaluation    PT/OT/SLP Co-Evaluation/Treatment: Yes Reason for Co-Treatment: For patient/therapist safety;To address functional/ADL transfers   OT goals addressed during session: ADL's and self-care;Proper use of Ada[employer] equipment and DME      AM-PAC OT "6 Clicks" Daily Activity     Outcome Measure   Help from another person eating meals?: None Help from another person taking care of personal grooming?: A Little Help from another person toileting, which includes using toliet, bedpan, or urinal?: A Little Help from another person bathing (including washing, rinsing, drying)?: A Little Help from another person to put on and taking off regular upper body clothing?: A Little Help from another person to put on and taking off regular lower body clothing?: A Little 6 Click Score: 19    End of Session Equipment Utilized  During Treatment: Other (comment) (bari rollator)  OT Visit Diagnosis: Other abnormalities of gait and mobility (R26.89);Muscle weakness (generalized) (M62.81);Other symptoms and signs involving cognitive function;Pain Pain - Right/Left: Left Pain - part of body: Knee   Activity Tolerance Patient tolerated treatment well   Patient Left in chair;with call bell/phone within reach   Nurse Communication Mobility status        Time: [id number] OT Time Calculation (min): 44 min  Charges: OT General Charges $OT Visit: 1 Visit OT Treatments $Self Care/Home Management : 23-37 mins  Zenovia Jarred, MSOT, OTR/L [facility] Office Number: [phone] Pager: [phone]  Zenovia Jarred 06/28/2020, 1:14 PM

## 2020-06-28 NOTE — TOC Transition Note (Signed)
Transition of Care [facility]) - CM/SW Discharge Note   Patient Details  Name: Carol Martin MRN: [id number] Date of Birth: 21-Apr-1941  Transition of Care [facility]) CM/SW Contact:  [name], RN Phone Number: 06/28/2020, 12:00 PM   Clinical Narrative:     Spoke to Dr Florene Glen , He will place order for Home health OT PT Aide nad wheelchair to [place]. [facility] patient [place] in [place]. Faxed information over, will fax order over as well.    Final next level of care: [place] Barriers to Discharge: No Barriers Identified   Patient Goals and CMS Choice     Choice offered to / list presented to : Patient, Adult Children  Discharge Placement                       Discharge Plan and Services                DME Arranged: Wheelchair manual DME Agency: [employer] Date DME Agency Contacted: 06/28/20 Time DME Agency Contacted: [id number] Representative spoke with at DME Agency: [place]: PT, OT, Nurse's Aide [place] Agency: [facility] [employer]) Date [place]: 06/28/20 Time [place]: 1000 Representative spoke with at [place]: [place] ([place]) Interventions     Readmission Risk Interventions No flowsheet data found.

## 2020-06-28 NOTE — Discharge Instructions (Signed)
Person Under Monitoring Name: Carol Martin  Location: [address] [place] [address]   Infection Prevention Recommendations for Individuals Confirmed to have, or Being Evaluated for, 2019 Novel Coronavirus (COVID-19) Infection Who Receive Care at Home  Individuals who are confirmed to have, or are being evaluated for, COVID-19 should follow the prevention steps below until Hikaru Delorenzo healthcare provider or local or state health department says they can return to normal activities.  Stay home except to get medical care You should restrict activities outside your home, except for getting medical care. Do not go to work, school, or public areas, and do not use public transportation or taxis.  Call ahead before visiting your doctor Before your medical appointment, call the healthcare provider and tell them that you have, or are being evaluated for, COVID-19 infection. This will help the healthcare provider's office take steps to keep other people from getting infected. Ask your healthcare provider to call the local or state health department.  Monitor your symptoms Seek prompt medical attention if your illness is worsening (e.g., difficulty breathing). Before going to your medical appointment, call the healthcare provider and tell them that you have, or are being evaluated for, COVID-19 infection. Ask your healthcare provider to call the local or state health department.  Wear Halen Antenucci facemask You should wear Chihiro Frey facemask that covers your nose and mouth when you are in the same room with other people and when you visit Ricahrd Schwager healthcare provider. People who live with or visit you should also wear Juandedios Dudash facemask while they are in the same room with you.  Separate yourself from other people in your home As much as possible, you should stay in Matias Thurman different room from other people in your home. Also, you should use Lovell Roe separate bathroom, if available.  Avoid sharing household items You should  not share dishes, drinking glasses, cups, eating utensils, towels, bedding, or other items with other people in your home. After using these items, you should wash them thoroughly with soap and water.  Cover your coughs and sneezes Cover your mouth and nose with Caidence Higashi tissue when you cough or sneeze, or you can cough or sneeze into your sleeve. Throw used tissues in Missey Hasley lined trash can, and immediately wash your hands with soap and water for at least 20 seconds or use an alcohol-based hand rub.  Wash your [facility] your hands often and thoroughly with soap and water for at least 20 seconds. You can use an alcohol-based hand sanitizer if soap and water are not available and if your hands are not visibly dirty. Avoid touching your eyes, nose, and mouth with unwashed hands.   Prevention Steps for Caregivers and Household Members of Individuals Confirmed to have, or Being Evaluated for, COVID-19 Infection Being Cared for in the Home  If you live with, or provide care at home for, Tanazia Achee person confirmed to have, or being evaluated for, COVID-19 infection please follow these guidelines to prevent infection:  Follow healthcare provider's instructions Make sure that you understand and can help the patient follow any healthcare provider instructions for all care.  Provide for the patient's basic needs You should help the patient with basic needs in the home and provide support for getting groceries, prescriptions, and other personal needs.  Monitor the patient's symptoms If they are getting sicker, call his or her medical provider and tell them that the patient has, or is being evaluated for, COVID-19 infection. This will help the healthcare  provider's office take steps to keep other people from getting infected. Ask the healthcare provider to call the local or state health department.  Limit the number of people who have contact with the patient  If possible, have only one caregiver for the  patient.  Other household members should stay in another home or place of residence. If this is not possible, they should stay  in another room, or be separated from the patient as much as possible. Use Tymeer Vaquera separate bathroom, if available.  Restrict visitors who do not have an essential need to be in the home.  Keep older adults, very young children, and other sick people away from the patient Keep older adults, very young children, and those who have compromised immune systems or chronic health conditions away from the patient. This includes people with chronic heart, lung, or kidney conditions, diabetes, and cancer.  Ensure good ventilation Make sure that shared spaces in the home have good air flow, such as from an air conditioner or an opened window, weather permitting.  Wash your hands often  Wash your hands often and thoroughly with soap and water for at least 20 seconds. You can use an alcohol based hand sanitizer if soap and water are not available and if your hands are not visibly dirty.  Avoid touching your eyes, nose, and mouth with unwashed hands.  Use disposable paper towels to dry your hands. If not available, use dedicated cloth towels and replace them when they become wet.  Wear Daelyn Pettaway facemask and gloves  Wear Estanislao Harmon disposable facemask at all times in the room and gloves when you touch or have contact with the patient's blood, body fluids, and/or secretions or excretions, such as sweat, saliva, sputum, nasal mucus, vomit, urine, or feces.  Ensure the mask fits over your nose and mouth tightly, and do not touch it during use.  Throw out disposable facemasks and gloves after using them. Do not reuse.  Wash your hands immediately after removing your facemask and gloves.  If your personal clothing becomes contaminated, carefully remove clothing and launder. Wash your hands after handling contaminated clothing.  Place all used disposable facemasks, gloves, and other waste in Chelsae Zanella lined  container before disposing them with other household waste.  Remove gloves and wash your hands immediately after handling these items.  Do not share dishes, glasses, or other household items with the patient  Avoid sharing household items. You should not share dishes, drinking glasses, cups, eating utensils, towels, bedding, or other items with Kaytelynn Scripter patient who is confirmed to have, or being evaluated for, COVID-19 infection.  After the person uses these items, you should wash them thoroughly with soap and water.  Wash laundry thoroughly  Immediately remove and wash clothes or bedding that have blood, body fluids, and/or secretions or excretions, such as sweat, saliva, sputum, nasal mucus, vomit, urine, or feces, on them.  Wear gloves when handling laundry from the patient.  Read and follow directions on labels of laundry or clothing items and detergent. In general, wash and dry with the warmest temperatures recommended on the label.  Clean all areas the individual has used often  Clean all touchable surfaces, such as counters, tabletops, doorknobs, bathroom fixtures, toilets, phones, keyboards, tablets, and bedside tables, every day. Also, clean any surfaces that may have blood, body fluids, and/or secretions or excretions on them.  Wear gloves when cleaning surfaces the patient has come in contact with.  Use Tylesha Gibeault diluted bleach solution (e.g., dilute bleach with  1 part bleach and 10 parts water) or Aman Bonet household disinfectant with Gehrig Patras label that says EPA-registered for coronaviruses. To make Jevaughn Degollado bleach solution at home, add 1 tablespoon of bleach to 1 quart (4 cups) of water. For Denton Derks larger supply, add  cup of bleach to 1 gallon (16 cups) of water.  Read labels of cleaning products and follow recommendations provided on product labels. Labels contain instructions for safe and effective use of the cleaning product including precautions you should take when applying the product, such as wearing gloves or  eye protection and making sure you have good ventilation during use of the product.  Remove gloves and wash hands immediately after cleaning.  Monitor yourself for signs and symptoms of illness Caregivers and household members are considered close contacts, should monitor their health, and will be asked to limit movement outside of the home to the extent possible. Follow the monitoring steps for close contacts listed on the symptom monitoring form.   ? If you have additional questions, contact your local health department or call the epidemiologist on call at [phone] (available 24/7). ? This guidance is subject to change. For the most up-to-date guidance from [facility], please refer to their website: [email or web address]

## 2020-06-28 NOTE — TOC Transition Note (Signed)
Transition of Care [facility]) - CM/SW Discharge Note   Patient Details  Name: Carol Martin MRN: [id number] Date of Birth: 1941-03-02  [facility]) CM/SW Contact:  [name], RN Phone Number: 06/28/2020, 3:13 PM   Clinical Narrative:     Damaris Schooner to [employer], patient daughter who she will be staying with. Updated her on all , Home health,[facility]  DME,[employer]  she asked about masks and gloves, told her she would have to order online. Or get from supply company. She voiced understanding at all instructions.   Final next level of care: [place] Barriers to Discharge: No Barriers Identified   Patient Goals and CMS Choice     Choice offered to / list presented to : Patient, Adult Children  Discharge Placement  Home to daughters house in [place] with [facility] and DME.                      Discharge Plan and Services                DME Arranged: 3-N-1 DME Agency: [employer] Date DME Agency Contacted: 06/28/20 Time DME Agency Contacted: 1300 Representative spoke with at DME Agency: [place] Arranged: PT, OT, Nurse's Aide [place] Agency: [facility] [employer] Date [place]: 06/28/20 Time [place]: 1000 Representative spoke with at [place]: [place] ([place]) Interventions     Readmission Risk Interventions No flowsheet data found.

## 2020-07-11 ENCOUNTER — Other Ambulatory Visit: Payer: Self-pay

## 2020-07-12 LAB — CBC WITH DIFFERENTIAL/PLATELET
Basophils Absolute: 0 10*3/uL (ref 0.0–0.2)
Basos: 1 %
EOS (ABSOLUTE): 0.1 10*3/uL (ref 0.0–0.4)
Eos: 2 %
Hematocrit: 40.4 % (ref 34.0–46.6)
Hemoglobin: 13.7 g/dL (ref 11.1–15.9)
Immature Grans (Abs): 0 10*3/uL (ref 0.0–0.1)
Immature Granulocytes: 0 %
Lymphocytes Absolute: 1.7 10*3/uL (ref 0.7–3.1)
Lymphs: 36 %
MCH: 30.8 pg (ref 26.6–33.0)
MCHC: 33.9 g/dL (ref 31.5–35.7)
MCV: 91 fL (ref 79–97)
Monocytes Absolute: 0.5 10*3/uL (ref 0.1–0.9)
Monocytes: 11 %
Neutrophils Absolute: 2.5 10*3/uL (ref 1.4–7.0)
Neutrophils: 50 %
Platelets: 255 10*3/uL (ref 150–450)
RBC: 4.45 x10E6/uL (ref 3.77–5.28)
RDW: 12.3 % (ref 11.7–15.4)
WBC: 4.8 10*3/uL (ref 3.4–10.8)

## 2020-07-12 LAB — BASIC METABOLIC PANEL
BUN/Creatinine Ratio: 7 — ABNORMAL LOW (ref 12–28)
BUN: 5 mg/dL — ABNORMAL LOW (ref 8–27)
CO2: 18 mmol/L — ABNORMAL LOW (ref 20–29)
Calcium: 8.9 mg/dL (ref 8.7–10.3)
Chloride: 104 mmol/L (ref 96–106)
Creatinine, Ser: 0.72 mg/dL (ref 0.57–1.00)
GFR calc Af Amer: 93 mL/min/{1.73_m2} (ref >59)
GFR calc non Af Amer: 80 mL/min/{1.73_m2} (ref >59)
Glucose: 90 mg/dL (ref 65–99)
Potassium: 4.7 mmol/L (ref 3.5–5.2)
Sodium: 141 mmol/L (ref 134–144)

## 2020-07-12 LAB — SPECIMEN STATUS REPORT

## 2021-04-23 ENCOUNTER — Other Ambulatory Visit: Payer: Self-pay | Admitting: Otolaryngology

## 2021-04-23 DIAGNOSIS — H6523 Chronic serous otitis media, bilateral: Secondary | ICD-10-CM

## 2021-05-01 ENCOUNTER — Ambulatory Visit: Payer: Medicare Other | Admitting: Orthopaedic Surgery

## 2021-05-13 ENCOUNTER — Ambulatory Visit: Payer: Medicare Other | Admitting: Orthopaedic Surgery

## 2021-05-17 ENCOUNTER — Other Ambulatory Visit: Payer: Medicare Other

## 2021-05-24 ENCOUNTER — Ambulatory Visit
Admission: RE | Admit: 2021-05-24 | Discharge: 2021-05-24 | Disposition: A | Discharge status: Home / Self Care | Payer: Medicare Other | Source: Ambulatory Visit | Attending: Otolaryngology | Admitting: Otolaryngology

## 2021-05-24 DIAGNOSIS — H6523 Chronic serous otitis media, bilateral: Secondary | ICD-10-CM

## 2021-05-27 ENCOUNTER — Other Ambulatory Visit: Payer: Self-pay

## 2021-05-27 ENCOUNTER — Ambulatory Visit: Payer: Self-pay

## 2021-05-27 ENCOUNTER — Ambulatory Visit (INDEPENDENT_AMBULATORY_CARE_PROVIDER_SITE_OTHER): Payer: Medicare Other | Admitting: Orthopaedic Surgery

## 2021-05-27 ENCOUNTER — Encounter: Payer: Self-pay | Admitting: Orthopaedic Surgery

## 2021-05-27 DIAGNOSIS — M25561 Pain in right knee: Secondary | ICD-10-CM

## 2021-05-27 DIAGNOSIS — M1712 Unilateral primary osteoarthritis, left knee: Secondary | ICD-10-CM | POA: Diagnosis not present

## 2021-05-27 DIAGNOSIS — M1711 Unilateral primary osteoarthritis, right knee: Secondary | ICD-10-CM | POA: Diagnosis not present

## 2021-05-27 MED ORDER — LIDOCAINE HCL 1 % IJ SOLN
3.0000 mL | INTRAMUSCULAR | Status: AC | PRN
  Administered 2021-05-27: 3 mL

## 2021-05-27 MED ORDER — METHYLPREDNISOLONE ACETATE 40 MG/ML IJ SUSP
40.0000 mg | INTRAMUSCULAR | Status: AC | PRN
  Administered 2021-05-27: 40 mg via INTRA_ARTICULAR

## 2021-05-27 NOTE — Progress Notes (Signed)
Office Visit Note   Patient: Carol Martin           Date of Birth: [birth year]           MRN: [id number] Visit Date: 05/27/2021              Requested by: Nolene Ebbs, MD [address] [place],  [place] [address] PCP: Nolene Ebbs, MD   Assessment & Plan: Visit Diagnoses:  1. Acute pain of both knees   2. Unilateral primary osteoarthritis, left knee   3. Unilateral primary osteoarthritis, right knee     Plan: Family with her today understand that she has severe end-stage arthritis of both knees and she is not a surgical candidate.  Since injections have helped in the past we recommended this again today in terms of the steroid injection in both knees.  She did tolerate them well in both knees.  She can always have these again in 3 to 4 months if needed.  All questions and concerns were answered and addressed.  Follow-Up Instructions: Return if symptoms worsen or fail to improve.   Orders:  Orders Placed This Encounter  Procedures   Large Joint Inj   Large Joint Inj   XR KNEE 3 VIEW RIGHT   XR KNEE 3 VIEW LEFT   No orders of the defined types were placed in this encounter.     Procedures: Large Joint Inj: R knee on 05/27/2021 3:44 PM Indications: diagnostic evaluation and pain Details: 22 G 1.5 in needle, superolateral approach  Arthrogram: No  Medications: 3 mL lidocaine 1 %; 40 mg methylPREDNISolone acetate 40 MG/ML Outcome: tolerated well, no immediate complications Procedure, treatment alternatives, risks and benefits explained, specific risks discussed. Consent was given by the patient. Immediately prior to procedure a time out was called to verify the correct patient, procedure, equipment, support staff and site/side marked as required. Patient was prepped and draped in the usual sterile fashion.    Large Joint Inj: L knee on 05/27/2021 3:44 PM Indications: diagnostic evaluation and pain Details: 22 G 1.5 in needle, superolateral approach  Arthrogram:  No  Medications: 3 mL lidocaine 1 %; 40 mg methylPREDNISolone acetate 40 MG/ML Outcome: tolerated well, no immediate complications Procedure, treatment alternatives, risks and benefits explained, specific risks discussed. Consent was given by the patient. Immediately prior to procedure a time out was called to verify the correct patient, procedure, equipment, support staff and site/side marked as required. Patient was prepped and draped in the usual sterile fashion.      Clinical Data: No additional findings.   Subjective: No chief complaint on file. The patient comes in today with severe bilateral knee pain.  She is someone at 39 who is morbidly obese with a seizure disorder and other medical issues including Alzheimer's disease.  She is listed as new patient because its been just over 3 years since I saw her last.  I placed steroid injections in her knees then.  Her knees hurt whether she is sitting or standing.  She ambulates slowly with a rolling walker.  She weighs around 280 pounds.  She has had no other acute change in her medical status.  She was hospitalized back in August with COVID.  HPI  Review of Systems There is currently listed no headache, chest pain, shortness of breath, fever, chills, nausea, vomiting  Objective: Vital Signs: There were no vitals taken for this visit.  Physical Exam She is alert and follows commands appropriately.  She is in  no acute distress  Ortho Exam It is certainly difficult to examine both knees due to the morbid obesity and soft tissue envelope around both knees.  They are both painful with limited range of motion. Specialty Comments:  No specialty comments available.  Imaging: XR KNEE 3 VIEW LEFT  Result Date: 05/27/2021 X-rays of the left knee show severe end-stage arthritis with complete loss of joint space in all 3 compartments.  There is varus malalignment.  There is no acute findings or evidence of fracture.    PMFS  History: Patient Active Problem List   Diagnosis Date Noted   COVID-19 06/23/2020   Chronic pain of both knees 02/11/2018   Bilateral hip pain 02/11/2018   Unilateral primary osteoarthritis, left knee 02/11/2018   Unilateral primary osteoarthritis, right knee 02/11/2018   Tinea corporis 04/29/2016   Cellulitis, abdominal wall 04/29/2016   Dilated aortic root (HCC)    Chronic diastolic CHF (congestive heart failure) (HCC)    Mild aortic insufficiency    Sleep apnea    Breast cancer ([place]) 02/08/2013   Abnormal EKG 05/18/2011   Dyspnea 05/18/2011   Hypertension 05/18/2011   Healthcare maintenance 05/18/2011   Alzheimer's disease ([place]) 05/17/2007   Past Medical History:  Diagnosis Date   Abnormal EKG    Allergic rhinitis    Alzheimer's disease ([place]) 05/17/07   Arthritis of knee    Breast cancer (HCC)    Diastolic dysfunction    Dilated aortic root (HCC)    GERD (gastroesophageal reflux disease)    Hypertension    Kidney stones    Mild aortic insufficiency    Morbid obesity ([place])    Sleep apnea    with insomnia   Venous (peripheral) insufficiency     Family History  Problem Relation Age of Onset   Hypertension Mother    Hypertension Sister    Cancer Sister        Unknown cancer    Past Surgical History:  Procedure Laterality Date   CESAREAN SECTION     DILATION AND CURETTAGE OF UTERUS     ECTOPIC PREGNANCY SURGERY     KIDNEY STONE SURGERY     REMOVAL   MASTECTOMY     R breast    TRACHEOSTOMY     Social History   Occupational History   Occupation: Retired   Tobacco Use   Smoking status: Never   Smokeless tobacco: Never  Vaping Use   Vaping Use: Never used  Substance and Sexual Activity   Alcohol use: No   Drug use: No   Sexual activity: Not Currently    Birth control/protection: Post-menopausal

## 2021-06-27 ENCOUNTER — Other Ambulatory Visit: Payer: Self-pay | Admitting: Internal Medicine

## 2021-06-28 LAB — LIPID PANEL
Cholesterol: 164 mg/dL (ref <200)
HDL: 54 mg/dL (ref >=50)
LDL Cholesterol (Calc): 82 mg/dL (calc)
Non-HDL Cholesterol (Calc): 110 mg/dL (calc) (ref <130)
Total CHOL/HDL Ratio: 3 (calc) (ref <5.0)
Triglycerides: 184 mg/dL — ABNORMAL HIGH (ref <150)

## 2021-06-28 LAB — COMPLETE METABOLIC PANEL WITH GFR
AG Ratio: 1.2 (calc) (ref 1.0–2.5)
ALT: 7 U/L (ref 6–29)
AST: 14 U/L (ref 10–35)
Albumin: 3.7 g/dL (ref 3.6–5.1)
Alkaline phosphatase (APISO): 98 U/L (ref 37–153)
BUN: 9 mg/dL (ref 7–25)
CO2: 25 mmol/L (ref 20–32)
Calcium: 9 mg/dL (ref 8.6–10.4)
Chloride: 103 mmol/L (ref 98–110)
Creat: 0.75 mg/dL (ref 0.60–1.00)
Globulin: 3.1 g/dL (calc) (ref 1.9–3.7)
Glucose, Bld: 115 mg/dL — ABNORMAL HIGH (ref 65–99)
Potassium: 3.9 mmol/L (ref 3.5–5.3)
Sodium: 141 mmol/L (ref 135–146)
Total Bilirubin: 0.6 mg/dL (ref 0.2–1.2)
Total Protein: 6.8 g/dL (ref 6.1–8.1)
eGFR: 81 mL/min/{1.73_m2} (ref >=60)

## 2021-06-28 LAB — CBC
HCT: 46 % — ABNORMAL HIGH (ref 35.0–45.0)
Hemoglobin: 14.9 g/dL (ref 11.7–15.5)
MCH: 30.5 pg (ref 27.0–33.0)
MCHC: 32.4 g/dL (ref 32.0–36.0)
MCV: 94.1 fL (ref 80.0–100.0)
MPV: 8.9 fL (ref 7.5–12.5)
Platelets: 284 10*3/uL (ref 140–400)
RBC: 4.89 10*6/uL (ref 3.80–5.10)
RDW: 12.8 % (ref 11.0–15.0)
WBC: 8.4 10*3/uL (ref 3.8–10.8)

## 2021-06-28 LAB — TSH: TSH: 0.48 mIU/L (ref 0.40–4.50)

## 2021-06-28 LAB — VITAMIN D 25 HYDROXY (VIT D DEFICIENCY, FRACTURES): Vit D, 25-Hydroxy: 80 ng/mL (ref 30–100)

## 2022-01-20 ENCOUNTER — Other Ambulatory Visit: Payer: Self-pay | Admitting: Obstetrics and Gynecology

## 2022-01-20 DIAGNOSIS — Z1231 Encounter for screening mammogram for malignant neoplasm of breast: Secondary | ICD-10-CM

## 2022-02-06 ENCOUNTER — Other Ambulatory Visit: Payer: Self-pay | Admitting: Internal Medicine

## 2022-03-11 ENCOUNTER — Other Ambulatory Visit: Payer: Self-pay | Admitting: Internal Medicine

## 2022-03-11 DIAGNOSIS — Z1231 Encounter for screening mammogram for malignant neoplasm of breast: Secondary | ICD-10-CM

## 2022-03-21 ENCOUNTER — Ambulatory Visit
Admission: RE | Admit: 2022-03-21 | Discharge: 2022-03-21 | Disposition: A | Discharge status: Home / Self Care | Payer: Medicare Other | Source: Ambulatory Visit | Attending: Internal Medicine | Admitting: Internal Medicine

## 2022-03-21 ENCOUNTER — Other Ambulatory Visit: Payer: Self-pay | Admitting: Internal Medicine

## 2022-03-21 DIAGNOSIS — Z1231 Encounter for screening mammogram for malignant neoplasm of breast: Secondary | ICD-10-CM

## 2022-04-05 ENCOUNTER — Encounter (HOSPITAL_COMMUNITY): Payer: Self-pay

## 2022-04-05 ENCOUNTER — Emergency Department (HOSPITAL_COMMUNITY): Payer: Medicare Other

## 2022-04-05 ENCOUNTER — Inpatient Hospital Stay (HOSPITAL_COMMUNITY)
Admission: EM | Admit: 2022-04-05 | Discharge: 2022-04-10 | DRG: 291 | Disposition: A | Discharge status: Home Health | Payer: Medicare Other | Attending: Family Medicine | Admitting: Family Medicine

## 2022-04-05 DIAGNOSIS — F0280 Dementia in other diseases classified elsewhere without behavioral disturbance: Secondary | ICD-10-CM | POA: Diagnosis present

## 2022-04-05 DIAGNOSIS — I110 Hypertensive heart disease with heart failure: Secondary | ICD-10-CM | POA: Diagnosis present

## 2022-04-05 DIAGNOSIS — J309 Allergic rhinitis, unspecified: Secondary | ICD-10-CM | POA: Diagnosis present

## 2022-04-05 DIAGNOSIS — K219 Gastro-esophageal reflux disease without esophagitis: Secondary | ICD-10-CM | POA: Diagnosis present

## 2022-04-05 DIAGNOSIS — I440 Atrioventricular block, first degree: Secondary | ICD-10-CM | POA: Diagnosis present

## 2022-04-05 DIAGNOSIS — Z87442 Personal history of urinary calculi: Secondary | ICD-10-CM | POA: Diagnosis not present

## 2022-04-05 DIAGNOSIS — Z91148 Patient's other noncompliance with medication regimen for other reason: Secondary | ICD-10-CM | POA: Diagnosis not present

## 2022-04-05 DIAGNOSIS — I77810 Thoracic aortic ectasia: Secondary | ICD-10-CM | POA: Diagnosis present

## 2022-04-05 DIAGNOSIS — R569 Unspecified convulsions: Secondary | ICD-10-CM | POA: Diagnosis present

## 2022-04-05 DIAGNOSIS — Z8249 Family history of ischemic heart disease and other diseases of the circulatory system: Secondary | ICD-10-CM | POA: Diagnosis not present

## 2022-04-05 DIAGNOSIS — R531 Weakness: Secondary | ICD-10-CM | POA: Diagnosis present

## 2022-04-05 DIAGNOSIS — R609 Edema, unspecified: Principal | ICD-10-CM

## 2022-04-05 DIAGNOSIS — Z9011 Acquired absence of right breast and nipple: Secondary | ICD-10-CM

## 2022-04-05 DIAGNOSIS — M79604 Pain in right leg: Secondary | ICD-10-CM | POA: Diagnosis present

## 2022-04-05 DIAGNOSIS — L309 Dermatitis, unspecified: Secondary | ICD-10-CM | POA: Diagnosis present

## 2022-04-05 DIAGNOSIS — Z853 Personal history of malignant neoplasm of breast: Secondary | ICD-10-CM | POA: Diagnosis not present

## 2022-04-05 DIAGNOSIS — G309 Alzheimer's disease, unspecified: Secondary | ICD-10-CM | POA: Diagnosis present

## 2022-04-05 DIAGNOSIS — G4733 Obstructive sleep apnea (adult) (pediatric): Secondary | ICD-10-CM | POA: Diagnosis present

## 2022-04-05 DIAGNOSIS — E8809 Other disorders of plasma-protein metabolism, not elsewhere classified: Secondary | ICD-10-CM | POA: Diagnosis present

## 2022-04-05 DIAGNOSIS — M79605 Pain in left leg: Secondary | ICD-10-CM | POA: Diagnosis present

## 2022-04-05 DIAGNOSIS — I872 Venous insufficiency (chronic) (peripheral): Secondary | ICD-10-CM | POA: Diagnosis present

## 2022-04-05 DIAGNOSIS — Z6841 Body Mass Index (BMI) 40.0 and over, adult: Secondary | ICD-10-CM

## 2022-04-05 DIAGNOSIS — M170 Bilateral primary osteoarthritis of knee: Secondary | ICD-10-CM | POA: Diagnosis present

## 2022-04-05 DIAGNOSIS — H9190 Unspecified hearing loss, unspecified ear: Secondary | ICD-10-CM | POA: Diagnosis present

## 2022-04-05 DIAGNOSIS — Z79899 Other long term (current) drug therapy: Secondary | ICD-10-CM

## 2022-04-05 DIAGNOSIS — R600 Localized edema: Secondary | ICD-10-CM | POA: Diagnosis present

## 2022-04-05 DIAGNOSIS — I10 Essential (primary) hypertension: Secondary | ICD-10-CM | POA: Diagnosis present

## 2022-04-05 DIAGNOSIS — R601 Generalized edema: Secondary | ICD-10-CM | POA: Diagnosis not present

## 2022-04-05 DIAGNOSIS — I5033 Acute on chronic diastolic (congestive) heart failure: Secondary | ICD-10-CM | POA: Diagnosis present

## 2022-04-05 DIAGNOSIS — G4700 Insomnia, unspecified: Secondary | ICD-10-CM | POA: Diagnosis present

## 2022-04-05 LAB — CBC WITH DIFFERENTIAL/PLATELET
Abs Immature Granulocytes: 0.02 10*3/uL (ref 0.00–0.07)
Basophils Absolute: 0.1 10*3/uL (ref 0.0–0.1)
Basophils Relative: 1 %
Eosinophils Absolute: 0.2 10*3/uL (ref 0.0–0.5)
Eosinophils Relative: 4 %
HCT: 45.1 % (ref 36.0–46.0)
Hemoglobin: 14.6 g/dL (ref 12.0–15.0)
Immature Granulocytes: 0 %
Lymphocytes Relative: 35 %
Lymphs Abs: 2.2 10*3/uL (ref 0.7–4.0)
MCH: 31.5 pg (ref 26.0–34.0)
MCHC: 32.4 g/dL (ref 30.0–36.0)
MCV: 97.2 fL (ref 80.0–100.0)
Monocytes Absolute: 0.6 10*3/uL (ref 0.1–1.0)
Monocytes Relative: 10 %
Neutro Abs: 3.1 10*3/uL (ref 1.7–7.7)
Neutrophils Relative %: 50 %
Platelets: 205 10*3/uL (ref 150–400)
RBC: 4.64 MIL/uL (ref 3.87–5.11)
RDW: 12.8 % (ref 11.5–15.5)
WBC: 6.2 10*3/uL (ref 4.0–10.5)
nRBC: 0 % (ref 0.0–0.2)

## 2022-04-05 LAB — COMPREHENSIVE METABOLIC PANEL
ALT: 9 U/L (ref 0–44)
AST: 22 U/L (ref 15–41)
Albumin: 3.4 g/dL — ABNORMAL LOW (ref 3.5–5.0)
Alkaline Phosphatase: 81 U/L (ref 38–126)
Anion gap: 6 (ref 5–15)
BUN: 5 mg/dL — ABNORMAL LOW (ref 8–23)
CO2: 26 mmol/L (ref 22–32)
Calcium: 8.9 mg/dL (ref 8.9–10.3)
Chloride: 108 mmol/L (ref 98–111)
Creatinine, Ser: 0.68 mg/dL (ref 0.44–1.00)
GFR, Estimated: >60 mL/min (ref >60)
Glucose, Bld: 102 mg/dL — ABNORMAL HIGH (ref 70–99)
Potassium: 4.1 mmol/L (ref 3.5–5.1)
Sodium: 140 mmol/L (ref 135–145)
Total Bilirubin: 0.6 mg/dL (ref 0.3–1.2)
Total Protein: 6.9 g/dL (ref 6.5–8.1)

## 2022-04-05 LAB — BRAIN NATRIURETIC PEPTIDE: B Natriuretic Peptide: 81.2 pg/mL (ref 0.0–100.0)

## 2022-04-05 LAB — MAGNESIUM: Magnesium: 2.4 mg/dL (ref 1.7–2.4)

## 2022-04-05 MED ORDER — HYDROCERIN EX CREA
TOPICAL_CREAM | Freq: Two times a day (BID) | CUTANEOUS | Status: DC | PRN
  Administered 2022-04-05: 1 via TOPICAL
  Filled 2022-04-05: qty 113

## 2022-04-05 MED ORDER — FUROSEMIDE 10 MG/ML IJ SOLN
40.0000 mg | Freq: Once | INTRAMUSCULAR | Status: AC
  Administered 2022-04-05: 40 mg via INTRAVENOUS
  Filled 2022-04-05: qty 4

## 2022-04-05 MED ORDER — FUROSEMIDE 10 MG/ML IJ SOLN
40.0000 mg | Freq: Two times a day (BID) | INTRAMUSCULAR | Status: DC
  Administered 2022-04-06 – 2022-04-08 (×5): 40 mg via INTRAVENOUS
  Filled 2022-04-05 (×5): qty 4

## 2022-04-05 MED ORDER — ACETAMINOPHEN 650 MG RE SUPP: 650.0000 mg | Freq: Four times a day (QID) | RECTAL | Status: DC | PRN

## 2022-04-05 MED ORDER — ENOXAPARIN SODIUM 40 MG/0.4ML IJ SOSY
40.0000 mg | PREFILLED_SYRINGE | Freq: Every day | INTRAMUSCULAR | Status: DC
  Administered 2022-04-05 – 2022-04-09 (×5): 40 mg via SUBCUTANEOUS
  Filled 2022-04-05 (×5): qty 0.4

## 2022-04-05 MED ORDER — ACETAMINOPHEN 325 MG PO TABS
650.0000 mg | ORAL_TABLET | Freq: Four times a day (QID) | ORAL | Status: DC | PRN
  Administered 2022-04-07 – 2022-04-10 (×4): 650 mg via ORAL
  Filled 2022-04-05 (×5): qty 2

## 2022-04-05 NOTE — ED Provider Notes (Signed)
[facility] DEPT Provider Note   CSN: [id number] Arrival date & time: 04/05/22  1812     History  Chief Complaint  Patient presents with   Foot Swelling    Carol Martin is a 81 y.o. female.  81 year old female presents with bilateral lower extremities swelling x1.5 weeks.  Notes slight increasing dyspnea.  Denies any chest pain or chest pressure.  No fever or chills.  Has had decreased ambulation.  Has been prescribed Lasix to take as needed when she has edema but has not been taking it recently.  No new medications started      Home Medications Prior to Admission medications   Medication Sig Start Date End Date Taking? Authorizing Provider  acetaminophen (TYLENOL) 500 MG tablet Take 1,000 mg by mouth daily as needed for mild pain. Patient not taking: Reported on 06/24/2020    [provider]  levETIRAcetam (KEPPRA) 500 MG tablet Take 500 mg by mouth 2 (two) times daily.    [provider]  lisinopril-hydrochlorothiazide (PRINZIDE,ZESTORETIC) 20-12.5 MG tablet Take 1 tablet by mouth daily.    [provider]  loratadine (CLARITIN) 10 MG tablet Take 10 mg by mouth daily. 04/16/20   [provider]  Multiple Vitamin (MULTIVITAMIN WITH MINERALS) TABS tablet Take 1 tablet by mouth daily.    [provider]  nystatin (NYSTATIN) powder Apply 1 application topically daily as needed (itching/rash).    [provider]  polyvinyl alcohol (ARTIFICIAL TEARS) 1.4 % ophthalmic solution Place 1 drop into both eyes daily as needed for dry eyes.    [provider]  potassium chloride (K-DUR,KLOR-CON) 10 MEQ tablet Take 10 mEq by mouth daily as needed (with each dose of torsemide).     [provider]  torsemide (DEMADEX) 20 MG tablet Take 20-40 mg by mouth daily as needed (leg swelling).     [provider]  triamcinolone (KENALOG) 0.025 % cream Apply 1 application topically daily as  needed (rash/itching).  03/08/18   [provider]  verapamil (CALAN-SR) 180 MG CR tablet Take 180 mg by mouth daily.  08/10/17   [provider]  Vitamin D, Ergocalciferol, (DRISDOL) 1.25 MG (50000 UNIT) CAPS capsule Take 50,000 Units by mouth every Sunday. 05/09/20   [provider]      Allergies    Patient has no known allergies.    Review of Systems   Review of Systems  All other systems reviewed and are negative.  Physical Exam Updated Vital Signs BP 133/76 (BP Location: Left Arm)   Pulse 81   Temp 98.2 F (36.8 C) (Oral)   Resp 16   SpO2 100%  Physical Exam Vitals and nursing note reviewed.  Constitutional:      General: She is not in acute distress.    Appearance: Normal appearance. She is well-developed. She is not toxic-appearing.  HENT:     Head: Normocephalic and atraumatic.  Eyes:     General: Lids are normal.     Conjunctiva/sclera: Conjunctivae normal.     Pupils: Pupils are equal, round, and reactive to light.  Neck:     Thyroid: No thyroid mass.     Trachea: No tracheal deviation.  Cardiovascular:     Rate and Rhythm: Normal rate and regular rhythm.     Heart sounds: Normal heart sounds. No murmur heard.   No gallop.  Pulmonary:     Effort: Pulmonary effort is normal. No respiratory distress.     Breath  sounds: Normal breath sounds. No stridor. No decreased breath sounds, wheezing, rhonchi or rales.  Abdominal:     General: There is no distension.     Palpations: Abdomen is soft.     Tenderness: There is no abdominal tenderness. There is no rebound.  Musculoskeletal:        General: No tenderness. Normal range of motion.     Cervical back: Normal range of motion and neck supple.     Comments: 3+ bilateral lower extremity pitting edema.  Neuro vas intact at both feet.  Skin:    General: Skin is warm and dry.     Findings: No abrasion or rash.  Neurological:     Mental Status: She is alert and oriented to person, place, and  time. Mental status is at baseline.     GCS: GCS eye subscore is 4. GCS verbal subscore is 5. GCS motor subscore is 6.     Cranial Nerves: No cranial nerve deficit.     Sensory: No sensory deficit.     Motor: Motor function is intact.  Psychiatric:        Attention and Perception: Attention normal.        Speech: Speech normal.        Behavior: Behavior normal.    ED Results / Procedures / Treatments   Labs (all labs ordered are listed, but only abnormal results are displayed) Labs Reviewed  CBC WITH DIFFERENTIAL/PLATELET  COMPREHENSIVE METABOLIC PANEL  URINALYSIS, ROUTINE W REFLEX MICROSCOPIC  BRAIN NATRIURETIC PEPTIDE    EKG None  Radiology No results found.  Procedures Procedures    Medications Ordered in ED Medications - No data to display  ED Course/ Medical Decision Making/ A&P                           Medical Decision Making Amount and/or Complexity of Data Reviewed Radiology: ordered. ECG/medicine tests: ordered.  Risk Prescription drug management. Decision regarding hospitalization.   Patient here with peripheral edema.  Concern for possible congestive heart failure.  Also considered kidney disease.  Patient's electrolytes including renal function without abnormality.  Chest x-ray without evidence of failure.  BNP normal.  Patient with severe edema to the point where she cannot walk.  Patient given Lasix 40 mg IV push.  Will be admitted to the hospital service.        Final Clinical Impression(s) / ED Diagnoses Final diagnoses:  None    Rx / DC Orders ED Discharge Orders     None         Lacretia Leigh, MD 04/05/22 2221

## 2022-04-05 NOTE — ED Provider Triage Note (Signed)
Emergency Medicine Provider Triage Evaluation Note  Carol Martin , a 81 y.o. female  was evaluated in triage.  Pt complains of bilateral leg and abdominal swelling. She states that same has been worsening for the past few weeks. She states she is not currently taking Lasix, she was told to only take it if she needs it. Denies any known history of CHF. Denies any chest pain or shortness of breath. States that she has had this happen before but is unsure of what the cause is. She does not wear compression stockings. Denies fevers, chills, nausea, vomiting, or diarrhea.  Upon chart review, it appears that her swelling in the past has been attributed to lymphedema.  Review of Systems  Positive:  Negative: See above  Physical Exam  BP (!) 146/87 (BP Location: Left Arm)   Pulse 95   Temp 98.2 F (36.8 C) (Oral)   Resp 18   SpO2 95%  Gen:   Awake, no distress   Resp:  Normal effort  MSK:   Moves extremities without difficulty  Other:  Significant bilateral lower extremity nonpitting edema.  Significant abdominal distention as well.  No associated erythema or warmth.  Medical Decision Making  Medically screening exam initiated at 7:06 PM.  Appropriate orders placed.  DERRY ARBOGAST was informed that the remainder of the evaluation will be completed by another provider, this initial triage assessment does not replace that evaluation, and the importance of remaining in the ED until their evaluation is complete.     Nestor Lewandowsky 04/05/22 1910

## 2022-04-05 NOTE — ED Triage Notes (Signed)
Pt presents with c/o bilateral feet swelling and abdominal swelling. Family at beside report that this has been present for several weeks and getting worse.

## 2022-04-06 ENCOUNTER — Inpatient Hospital Stay (HOSPITAL_COMMUNITY): Payer: Medicare Other

## 2022-04-06 ENCOUNTER — Encounter (HOSPITAL_COMMUNITY): Payer: Self-pay | Admitting: Internal Medicine

## 2022-04-06 DIAGNOSIS — I5033 Acute on chronic diastolic (congestive) heart failure: Secondary | ICD-10-CM | POA: Diagnosis not present

## 2022-04-06 DIAGNOSIS — R601 Generalized edema: Secondary | ICD-10-CM

## 2022-04-06 DIAGNOSIS — J309 Allergic rhinitis, unspecified: Secondary | ICD-10-CM | POA: Diagnosis present

## 2022-04-06 DIAGNOSIS — R531 Weakness: Secondary | ICD-10-CM

## 2022-04-06 LAB — CBC WITH DIFFERENTIAL/PLATELET
Abs Immature Granulocytes: 0.01 10*3/uL (ref 0.00–0.07)
Basophils Absolute: 0.1 10*3/uL (ref 0.0–0.1)
Basophils Relative: 1 %
Eosinophils Absolute: 0.3 10*3/uL (ref 0.0–0.5)
Eosinophils Relative: 5 %
HCT: 43.4 % (ref 36.0–46.0)
Hemoglobin: 14 g/dL (ref 12.0–15.0)
Immature Granulocytes: 0 %
Lymphocytes Relative: 34 %
Lymphs Abs: 1.9 10*3/uL (ref 0.7–4.0)
MCH: 31.5 pg (ref 26.0–34.0)
MCHC: 32.3 g/dL (ref 30.0–36.0)
MCV: 97.5 fL (ref 80.0–100.0)
Monocytes Absolute: 0.6 10*3/uL (ref 0.1–1.0)
Monocytes Relative: 11 %
Neutro Abs: 2.9 10*3/uL (ref 1.7–7.7)
Neutrophils Relative %: 49 %
Platelets: 208 10*3/uL (ref 150–400)
RBC: 4.45 MIL/uL (ref 3.87–5.11)
RDW: 12.8 % (ref 11.5–15.5)
WBC: 5.8 10*3/uL (ref 4.0–10.5)
nRBC: 0 % (ref 0.0–0.2)

## 2022-04-06 LAB — URINALYSIS, ROUTINE W REFLEX MICROSCOPIC
Bilirubin Urine: NEGATIVE
Glucose, UA: NEGATIVE mg/dL
Hgb urine dipstick: NEGATIVE
Ketones, ur: NEGATIVE mg/dL
Leukocytes,Ua: NEGATIVE
Nitrite: NEGATIVE
Protein, ur: NEGATIVE mg/dL
Specific Gravity, Urine: 1.009 (ref 1.005–1.030)
pH: 7 (ref 5.0–8.0)

## 2022-04-06 LAB — COMPREHENSIVE METABOLIC PANEL
ALT: 10 U/L (ref 0–44)
AST: 21 U/L (ref 15–41)
Albumin: 3.1 g/dL — ABNORMAL LOW (ref 3.5–5.0)
Alkaline Phosphatase: 69 U/L (ref 38–126)
Anion gap: 6 (ref 5–15)
BUN: <5 mg/dL — ABNORMAL LOW (ref 8–23)
CO2: 29 mmol/L (ref 22–32)
Calcium: 8.6 mg/dL — ABNORMAL LOW (ref 8.9–10.3)
Chloride: 107 mmol/L (ref 98–111)
Creatinine, Ser: 0.74 mg/dL (ref 0.44–1.00)
GFR, Estimated: >60 mL/min (ref >60)
Glucose, Bld: 103 mg/dL — ABNORMAL HIGH (ref 70–99)
Potassium: 3.6 mmol/L (ref 3.5–5.1)
Sodium: 142 mmol/L (ref 135–145)
Total Bilirubin: 1 mg/dL (ref 0.3–1.2)
Total Protein: 6.4 g/dL — ABNORMAL LOW (ref 6.5–8.1)

## 2022-04-06 LAB — MAGNESIUM: Magnesium: 2.3 mg/dL (ref 1.7–2.4)

## 2022-04-06 LAB — ECHOCARDIOGRAM COMPLETE
AR max vel: 2.29 cm2
AV Area VTI: 2.36 cm2
AV Area mean vel: 2.22 cm2
AV Mean grad: 4 mmHg
AV Peak grad: 7.3 mmHg
Ao pk vel: 1.35 m/s
Area-P 1/2: 6.27 cm2
Calc EF: 57.1 %
P 1/2 time: 464 msec
S' Lateral: 3 cm
Single Plane A2C EF: 55.6 %
Single Plane A4C EF: 58.1 %
Weight: 5058.23 oz

## 2022-04-06 LAB — PROTIME-INR
INR: 1.1 (ref 0.8–1.2)
Prothrombin Time: 14.4 seconds (ref 11.4–15.2)

## 2022-04-06 LAB — TSH: TSH: 0.518 u[IU]/mL (ref 0.350–4.500)

## 2022-04-06 MED ORDER — PANTOPRAZOLE SODIUM 40 MG IV SOLR
40.0000 mg | Freq: Every day | INTRAVENOUS | Status: DC
  Administered 2022-04-06: 40 mg via INTRAVENOUS
  Filled 2022-04-06: qty 10

## 2022-04-06 MED ORDER — CHLORHEXIDINE GLUCONATE CLOTH 2 % EX PADS: 6.0000 | MEDICATED_PAD | Freq: Every day | CUTANEOUS | Status: DC

## 2022-04-06 MED ORDER — NYSTATIN 100000 UNIT/GM EX POWD
1.0000 | Freq: Every day | CUTANEOUS | Status: DC | PRN
Start: 2022-04-06 — End: 2022-04-11
  Filled 2022-04-06: qty 15

## 2022-04-06 MED ORDER — PANTOPRAZOLE SODIUM 40 MG PO TBEC
40.0000 mg | DELAYED_RELEASE_TABLET | Freq: Every day | ORAL | Status: DC
  Administered 2022-04-07 – 2022-04-10 (×4): 40 mg via ORAL
  Filled 2022-04-06 (×4): qty 1

## 2022-04-06 MED ORDER — LISINOPRIL 20 MG PO TABS
20.0000 mg | ORAL_TABLET | Freq: Every day | ORAL | Status: DC
  Administered 2022-04-06 – 2022-04-10 (×5): 20 mg via ORAL
  Filled 2022-04-06 (×5): qty 1

## 2022-04-06 MED ORDER — LORATADINE 10 MG PO TABS
10.0000 mg | ORAL_TABLET | Freq: Every day | ORAL | Status: DC
  Administered 2022-04-06 – 2022-04-10 (×5): 10 mg via ORAL
  Filled 2022-04-06 (×5): qty 1

## 2022-04-06 NOTE — Evaluation (Signed)
Physical Therapy Evaluation Patient Details Name: Carol Martin MRN: [id number] DOB: 11-23-1940 Today's Date: 04/06/2022  History of Present Illness  81 y.o. female with admitted with sepsis, edema, CHF exacerbation. History significant for chronic diastolic heart failure, allergic rhinitis, essential hypertension, morbid obesity, falls, COVID, breast ca, dementia  Clinical Impression  On eval, pt required Max A +2 for mobility. She was unable to stand from recliner using RW. Used STEDY for standing and transfer back to bed. Pt presents with general weakness, decreased activity tolerance, and impaired gait and balance. PT recommendation is for ST SNF rehab. Will plan to follow pt during this hospital stay.        Recommendations for follow up therapy are one component of a multi-disciplinary discharge planning process, led by the attending physician.  Recommendations may be updated based on patient status, additional functional criteria and insurance authorization.  Follow Up Recommendations Skilled nursing-short term rehab (<3 hours/day)    Assistance Recommended at Discharge Frequent or constant Supervision/Assistance  Patient can return home with the following  Assist for transportation;Help with stairs or ramp for entrance;Assistance with cooking/housework;Two people to help with bathing/dressing/bathroom;Two people to help with walking and/or transfers    Equipment Recommendations None recommended by PT  Recommendations for Other Services       Functional Status Assessment Patient has had a recent decline in their functional status and demonstrates the ability to make significant improvements in function in a reasonable and predictable amount of time.     Precautions / Restrictions Precautions Precautions: Fall Precaution Comments: frequent urination, frequent belching (assume normal), concern for skin between folds Restrictions Weight Bearing Restrictions: No      Mobility   Bed Mobility Overal bed mobility: Needs Assistance Bed Mobility: Sit to Supine Rolling: +2 for physical assistance, Max assist     Sit to supine: Max assist, +2 for physical assistance   General bed mobility comments: Patient managing upper body - but +2 for LEs. Use of bed mechanics for bed mobility    Transfers Overall transfer level: Needs assistance   Transfers: Sit to/from Stand, Bed to chair/wheelchair/BSC             General transfer comment: Attempted stand with RW-pt unable. Utilized STEDY to stand from recliner. Heavy +2 to rise and to get flaps closed. Cues for pt to put forth max effort. Increased time. Transferred back to bed using STEDY Transfer via Lift Equipment: Stedy  Ambulation/Gait                  Stairs            Wheelchair Mobility    Modified Rankin (Stroke Patients Only)       Balance Overall balance assessment: Needs assistance         Standing balance support: Reliant on assistive device for balance Standing balance-Leahy Scale: Poor                               Pertinent Vitals/Pain Pain Assessment Pain Assessment: Faces Faces Pain Scale: Hurts even more Pain Location: L forearm (IV site) Pain Descriptors / Indicators: Grimacing, Guarding Pain Intervention(s): Monitored during session, Limited activity within patient's tolerance, Repositioned    Home Living Family/patient expects to be discharged to:: Private residence Living Arrangements: Children Available Help at Discharge: Available 24 hours/day;Available PRN/intermittently Type of Home: Apartment Home Access: Level entry       Home Layout: One  level Home Equipment: Air cabin crew (4 wheels)      Prior Function Prior Level of Function : Needs assist             Mobility Comments: rollator. Mostly in her room but can walk to living room at least every other day. ADLs Comments: assistance for bathiing, LB ADLs, says she can  typically go to bathroom and toilet herself. Has BSC in her room.     Hand Dominance   Dominant Hand: Right    Extremity/Trunk Assessment   Upper Extremity Assessment Upper Extremity Assessment: Defer to OT evaluation RUE Deficits / Details: WFL ROM, grossly 4/5 - RUE functional arm use limited by pain at IV site RUE Sensation: WNL RUE Coordination: WNL LUE Deficits / Details: WFL ROM, grossly  4/5 LUE Sensation: WNL LUE Coordination: WNL    Lower Extremity Assessment Lower Extremity Assessment: Generalized weakness (bil edema)    Cervical / Trunk Assessment Cervical / Trunk Assessment:  (pannus)  Communication   Communication: HOH  Cognition Arousal/Alertness: Awake/alert Behavior During Therapy: WFL for tasks assessed/performed Overall Cognitive Status: Within Functional Limits for tasks assessed                                          General Comments      Exercises     Assessment/Plan    PT Assessment Patient needs continued PT services  PT Problem List Decreased strength;Decreased mobility;Decreased range of motion;Decreased activity tolerance;Obesity;Decreased balance;Decreased knowledge of use of DME;Pain;Decreased skin integrity       PT Treatment Interventions DME instruction;Therapeutic activities;Gait training;Therapeutic exercise;Balance training;Functional mobility training;Patient/family education    PT Goals (Current goals can be found in the Care Plan section)  Acute Rehab PT Goals Patient Stated Goal: less pain. PT Goal Formulation: With patient Time For Goal Achievement: 04/20/22 Potential to Achieve Goals: Fair    Frequency Min 2X/week     Co-evaluation   Reason for Co-Treatment: To address functional/ADL transfers           AM-PAC PT "6 Clicks" Mobility  Outcome Measure Help needed turning from your back to your side while in a flat bed without using bedrails?: Total Help needed moving from lying on your back  to sitting on the side of a flat bed without using bedrails?: Total Help needed moving to and from a bed to a chair (including a wheelchair)?: Total Help needed standing up from a chair using your arms (e.g., wheelchair or bedside chair)?: Total Help needed to walk in hospital room?: Total Help needed climbing 3-5 steps with a railing? : Total 6 Click Score: 6    End of Session   Activity Tolerance: Patient limited by fatigue;Patient limited by pain Patient left: in bed;with call bell/phone within reach;with bed alarm set        Time: 1100-1130 PT Time Calculation (min) (ACUTE ONLY): 30 min   Charges:   PT Evaluation $PT Eval Moderate Complexity: 1 Mod             Doreatha Massed, PT Acute Rehabilitation  Office: [phone] Pager: [phone]

## 2022-04-06 NOTE — Assessment & Plan Note (Signed)
  #)   Essential Hypertension: documented h/o such, with outpatient antihypertensive regimen including lisinopril, HCTZ, and verapamil.  SBP's in the ED today: Normotensive.  We will closely monitor ensuing blood pressure, particularly in response to IV diuresis efforts.  Additionally, in the setting of these IV diuresis efforts, will hold home HCTZ for now.  Additionally, in the setting of evidence of first-degree AV block, will hold home verapamil for now.  Plan: Close monitoring of subsequent BP via routine VS. continue home lisinopril will holding home HCTZ and verapamil for now.

## 2022-04-06 NOTE — Progress Notes (Signed)
PHARMACIST - PHYSICIAN COMMUNICATION  DR:   Algis Liming  CONCERNING: IV to Oral Route Change Policy  RECOMMENDATION: This patient is receiving pantoprazole by the intravenous route.  Based on criteria approved by the Pharmacy and Therapeutics Committee, the intravenous medication(s) is/are being converted to the equivalent oral dose form(s).   DESCRIPTION: These criteria include: The patient is eating (either orally or via tube) and/or has been taking other orally administered medications for a least 24 hours The patient has no evidence of active gastrointestinal bleeding or impaired GI absorption (gastrectomy, short bowel, patient on TNA or NPO).  If you have questions about this conversion, please contact the Pharmacy Department  '[]'$   [phone] )  Carol Martin '[]'$   [phone] )  [facility] '[]'$   [phone] )  Carol Martin '[]'$   [phone] )  [facility] '[x]'$   [phone] )  [place], [facility] 04/06/2022 1:02 PM

## 2022-04-06 NOTE — Progress Notes (Signed)
PROGRESS NOTE   Carol Martin  JJH:[id number]    DOB: 1941/10/15    DOA: 04/05/2022  PCP: Nolene Ebbs, MD   I have briefly reviewed patients previous medical records in [facility].  Chief Complaint  Patient presents with   Foot Swelling    Brief Narrative:  81 year old female, lives with her daughter, ambulates with the help of a walker, medical history significant for extreme hard of hearing, very morbid obesity, OSA, not sure if compliant with CPAP, peripheral venous insufficiency, hypertension, mild Alzheimer's disease, GERD, dilated aortic root, seizures not on medications, chronic diastolic CHF, [id number] pneumonia in August 2021, severe end-stage arthritis of both knees and not a surgical candidate, presented to the ED on 04/05/2022 with 1 month history of progressive bilateral lower extremity swelling, weight gain of unknown amount, generalized weakness and ambulatory dysfunction.  Admitted for suspected CHF decompensation.  IV Lasix.   Assessment & Plan:  Principal Problem:   Acute on chronic diastolic heart failure (HCC) Active Problems:   Essential hypertension   Generalized weakness   Allergic rhinitis   Anasarca: DD: acute on chronic diastolic CHF but no dyspnea, lungs clear to auscultation, chest x-ray negative and BNP normal (however very morbidly obese) Vs R heart failure (? Pulm HTN) Vs worsening lower extremity venous insufficiency, complicated by hypoalbuminemia Vs other etiologies.  Follow TTE results.  Low sodium and fluid restriction 1500 mL/day.  Strict intake output and daily weights.  Try lower extremity compressive stockings if can fit.  Continue IV Lasix 40 mg twice daily.  Monitor daily BMP.  Essential hypertension: Controlled.  Continue lisinopril 20 Mg daily.  Verapamil on hold due to EKG with first-degree AV block.  GERD: Continue PPI  Peripheral venous insufficiency: Diurese as above.  Trial of lower extremity compression stockings  OSA: Will  need to verify if patient is compliant with her CPAP and continue same  Extremely hard of hearing:  Generalized weakness/ambulatory dysfunction: Likely multifactorial.  PT and OT input appreciated and recommend SNF.  Body mass index is 59.73 kg/m./Very morbid obesity    DVT prophylaxis: enoxaparin (LOVENOX) injection 40 mg Start: 04/05/22 2230     Code Status: Full Code:  Family Communication: None at bedside Disposition:  Status is: Inpatient IV Lasix.     Consultants:   None  Procedures:     Antimicrobials:      Subjective:  Extremely hard of hearing.  Reports lower extremity swelling of almost 1 month duration.  Denies dyspnea or chest pain.  States that she no longer has "fluid pills" in her pillbox.  Objective:   Vitals:   04/06/22 0434 04/06/22 0757 04/06/22 0954 04/06/22 1207  BP: 127/70 (!) 150/82 130/89 134/73  Pulse: 99 93 99 91  Resp: '19 16 19   '$ Temp: 98.1 F (36.7 C) 98.1 F (36.7 C) 98.4 F (36.9 C) 98.1 F (36.7 C)  TempSrc: Oral Oral Oral Oral  SpO2: 96% 93% 96% 98%  Weight: (!) 143.4 kg       General exam: Elderly female, moderately built, very morbidly obese sitting up comfortably in reclining chair without distress. Respiratory system: Clear to auscultation. Respiratory effort normal. Cardiovascular system: S1 & S2 heard, RRR. No JVD, murmurs, rubs, gallops or clicks.  Bilateral leg 2+ edema extending to lower anterior abdominal wall with some peau d' Orange like looked but without cellulitis features.  No presacral edema. Gastrointestinal system: Abdomen is obese with extensive pannus, soft and nontender. No organomegaly or masses felt.  Normal bowel sounds heard. Central nervous system: Alert and oriented. No focal neurological deficits.  Extremely hard of hearing Extremities: Symmetric 5 x 5 power. Skin: No rashes, lesions or ulcers Psychiatry: Judgement and insight appear normal. Mood & affect appropriate.     Data Reviewed:   I have  personally reviewed following labs and imaging studies   CBC: Recent Labs  Lab 04/05/22 1923 04/06/22 0505  WBC 6.2 5.8  NEUTROABS 3.1 2.9  HGB 14.6 14.0  HCT 45.1 43.4  MCV 97.2 97.5  PLT 205 035    Basic Metabolic Panel: Recent Labs  Lab 04/05/22 1923 04/06/22 0505  NA 140 142  K 4.1 3.6  CL 108 107  CO2 26 29  GLUCOSE 102* 103*  BUN 5* <5*  CREATININE 0.68 0.74  CALCIUM 8.9 8.6*  MG 2.4 2.3    Liver Function Tests: Recent Labs  Lab 04/05/22 1923 04/06/22 0505  AST 22 21  ALT 9 10  ALKPHOS 81 69  BILITOT 0.6 1.0  PROT 6.9 6.4*  ALBUMIN 3.4* 3.1*    CBG: No results for input(s): GLUCAP in the last 168 hours.  Microbiology Studies:  No results found for this or any previous visit (from the past 240 hour(s)).  Radiology Studies:  DG Chest 2 View  Result Date: 04/05/2022 CLINICAL DATA:  History of breast cancer with bilateral feet swelling and abdominal swelling. EXAM: CHEST - 2 VIEW COMPARISON:  June 23, 2020 FINDINGS: The cardiac silhouette is mildly enlarged and unchanged in size. Mild, stable linear scarring and/or atelectasis is seen within the left lung base. There is no evidence of focal consolidation, pleural effusion or pneumothorax. The visualized skeletal structures are unremarkable. IMPRESSION: Stable cardiomegaly with mild left basilar linear scarring and/or atelectasis. Electronically Signed   By: Virgina Norfolk M.D.   On: 04/05/2022 20:49    Scheduled Meds:    enoxaparin (LOVENOX) injection  40 mg Subcutaneous QHS   furosemide  40 mg Intravenous BID   lisinopril  20 mg Oral Daily   loratadine  10 mg Oral Daily   [START ON 04/07/2022] pantoprazole  40 mg Oral Daily    Continuous Infusions:     LOS: 1 day     Vernell Leep, MD,  FACP, [facility], [facility], [facility] (Care Management Physician Certified) [place]  To contact the attending provider between 7A-7P or the covering provider during after  hours 7P-7A, please log into the web site [email or web address] and access using universal [place] password for that web site. If you do not have the password, please call the hospital operator.  04/06/2022, 1:58 PM

## 2022-04-06 NOTE — Assessment & Plan Note (Signed)
 #)   Acute on chronic diastolic heart failure: Suspected dx of acute decompensation on the basis of presenting to 3 weeks of progressive worsening of edema in the bilateral lower extremities associated with weight gain over that timeframe. This is in the context of a known history of chronic diastolic heart failure, with most recent echocardiogram performed June 2015, which is notable for LVEF 55% along with severe LVH, with the latter associated with increased risk for ensuing development of systolic dysfunction.  Echocardiogram in May 2014 was notable for grade 1 diastolic dysfunction, as further detailed above.  Etiology leading to presenting acutely decompensated heart failure is currently unclear, although the patient does convey an element of suboptimal compliance with intermittent diuretic use at home.  Aside from HCTZ, she conveys that she is not currently taking any scheduled diuretics.   Overall, ACS leading to presenting acutely decompensated heart failure appears less likely at this time in the absence of any recent CP, presenting EKG showing no evidence of acute ischemic changes.  No evidence of associated respiratory distress, and chest x-ray shows no evidence of acute cardiopulmonary process.   Of note, patient received Lasix 40 mg IV x1 while in the ED today. Presentation warrants additional IV diuresis, as further detailed below, with close monitoring of ensuing renal function, electrolytes, and volume status, as further noted below.   Given that has been 8 years since most recent echocardiogram, with most recent prior echo showing severe LVH, increasing risk for ensuing development of systolic dysfunction, and in the setting of suspected acutely decompensated heart failure at time of this evening's presentation, will pursue updated echocardiogram, as further detailed below.  Factors that may also be contributing to her worsening edema bilateral extremities is a documented history of chronic  venous insufficiency.  We will also check INR to evaluate synthetic hepatic function.   Plan: monitor strict I's & O's and daily weights. Monitor on telemetry. Monitor continuous pulse oximetry. Repeat BMP in the morning, including for monitoring trend of potassium, bicarbonate, and renal function in response to interval diuresis efforts. Check serum magnesium level. Close monitoring of ensuing blood pressure response to diuresis efforts, including to help guide need for improvement in afterload reduction in order to optimize cardiac output. Lasix 40 mg IV twice daily. Continue outpatient ACE-I .  Hold home HCTZ for now in the setting of IV diuresis efforts. Will also hold outpatient verapamil for now given presenting EKG indicating first-degree AV block.  Echocardiogram in the morning.  Check INR.

## 2022-04-06 NOTE — Evaluation (Signed)
Occupational Therapy Evaluation Patient Details Name: Carol Martin MRN: [id number] DOB: May 01, 1941 Today's Date: 04/06/2022   History of Present Illness Carol Martin is a 81 y.o. female with medical history significant for chronic diastolic heart failure, allergic rhinitis, essential hypertension, who is admitted to [facility] on 04/05/2022 with suspected acute on chronic diastolic heart failure after presenting from home to [facility] ED complaining of worsening of edema in the bilateral lower extremities. Admiited for acute on chronic CHF.   Clinical Impression   Carol Martin is an 81 year old woman who presents with CHF. At baseline she can ambulate in home with rolator but is mostly sedentary. She has assistance for ADLs and IADLs from daughter but can typically perform toileting, toilet transfer and UB ADLs. On evaluation she presents with generalized weakness, decreased activity tolerance, decreased functional use of RUE due to pain at IV site, and decreased cardiopulmonary endurance. Today patient max x 2 to stand using stedy bar to pull up on. She was unable to rise despite two attempts from recliner without the stedy. She was total assist for transfer to bed and max x 2 for Les to return to supine. She is set up for UB ADLs and total assist for toileting, LB dressing, toilet transfer and max assist for LB bathing. Patient will benefit from skilled OT services while in hospital to improve deficits and learn compensatory strategies as needed in order to return to PLOF.  Short term rehab prior to discharge home is recommended.      Recommendations for follow up therapy are one component of a multi-disciplinary discharge planning process, led by the attending physician.  Recommendations may be updated based on patient status, additional functional criteria and insurance authorization.   Follow Up Recommendations  Skilled nursing-short term rehab (<3 hours/day)    Assistance  Recommended at Discharge Frequent or constant Supervision/Assistance  Patient can return home with the following Two people to help with walking and/or transfers;Two people to help with bathing/dressing/bathroom;Assistance with cooking/housework;Assist for transportation;Help with stairs or ramp for entrance    Functional Status Assessment  Patient has had a recent decline in their functional status and demonstrates the ability to make significant improvements in function in a reasonable and predictable amount of time.  Equipment Recommendations  None recommended by OT    Recommendations for Other Services       Precautions / Restrictions Precautions Precautions: Fall Precaution Comments: frequent urination, frequent belching (assume normal), concern for skin between folds Restrictions Weight Bearing Restrictions: No      Mobility Bed Mobility Overal bed mobility: Needs Assistance Bed Mobility: Sit to Supine, Rolling Rolling: +2 for physical assistance, Max assist     Sit to supine: Max assist, +2 for physical assistance   General bed mobility comments: Patient managing upper body - but +2 for LEs. Use of bed mechanics for bed mobility    Transfers Overall transfer level: Needs assistance   Transfers: Sit to/from Stand, Bed to chair/wheelchair/BSC Sit to Stand: Max assist, +2 safety/equipment                  Balance Overall balance assessment: Needs assistance Sitting-balance support: No upper extremity supported, Feet supported Sitting balance-Leahy Scale: Good     Standing balance support: Reliant on assistive device for balance Standing balance-Leahy Scale: Poor  ADL either performed or assessed with clinical judgement   ADL Overall ADL's : Needs assistance/impaired Eating/Feeding: Independent   Grooming: Set up;Sitting   Upper Body Bathing: Minimal assistance;Sitting;Set up   Lower Body Bathing: Maximal  assistance;Sitting/lateral leans Lower Body Bathing Details (indicate cue type and reason): needing assistance for cleaning skin folds due to itching/irritation. Upper Body Dressing : Set up;Sitting   Lower Body Dressing: Total assistance;Sit to/from stand;+2 for physical assistance   Toilet Transfer: Total assistance;BSC/3in1;+2 for physical assistance Toilet Transfer Details (indicate cue type and reason): to transfer to [facility] - is total assist with use of stedy. Also total due to reliance on external catheter Toileting- Clothing Manipulation and Hygiene: Total assistance       Functional mobility during ADLs: Maximal assistance;+2 for physical assistance General ADL Comments: Heavy Max x 2 to stand from recliner and with stedy. Patient pulling on stedy bar.     Vision Patient Visual Report: No change from baseline       Perception     Praxis      Pertinent Vitals/Pain Pain Assessment Pain Assessment: Faces Faces Pain Scale: Hurts even more Pain Location: L forearm (IV site) Pain Descriptors / Indicators: Grimacing, Guarding Pain Intervention(s): Monitored during session     Hand Dominance Right   Extremity/Trunk Assessment Upper Extremity Assessment Upper Extremity Assessment: RUE deficits/detail;LUE deficits/detail RUE Deficits / Details: WFL ROM, grossly 4/5 - RUE functional arm use limited by pain at IV site RUE Sensation: WNL RUE Coordination: WNL LUE Deficits / Details: WFL ROM, grossly  4/5 LUE Sensation: WNL LUE Coordination: WNL   Lower Extremity Assessment Lower Extremity Assessment: Defer to PT evaluation   Cervical / Trunk Assessment Cervical / Trunk Assessment:  (body habitus, panus)   Communication Communication Communication: HOH   Cognition Arousal/Alertness: Awake/alert Behavior During Therapy: WFL for tasks assessed/performed Overall Cognitive Status: Within Functional Limits for tasks assessed                                        General Comments       Exercises     Shoulder Instructions      Home Living Family/patient expects to be discharged to:: Private residence Living Arrangements: Children Available Help at Discharge: Available 24 hours/day;Available PRN/intermittently Type of Home: Apartment Home Access: Level entry     Home Layout: One level     Bathroom Shower/Tub: Teacher, early years/pre: Standard Bathroom Accessibility: Yes How Accessible: Accessible via walker Home Equipment: Air cabin crew (4 wheels)          Prior Functioning/Environment Prior Level of Function : Needs assist             Mobility Comments: rollator. Mostly in her room but can walk to living room at least every other day. ADLs Comments: assistance for bathiing, LB ADLs, says she can typically go to bathroom and toilet herself. Has BSC in her room.        OT Problem List: Decreased strength;Obesity;Increased edema;Impaired UE functional use;Cardiopulmonary status limiting activity;Decreased activity tolerance;Impaired balance (sitting and/or standing);Decreased knowledge of use of DME or AE      OT Treatment/Interventions: Self-care/ADL training;DME and/or AE instruction;Therapeutic activities;Balance training;Patient/family education    OT Goals(Current goals can be found in the care plan section) Acute Rehab OT Goals Patient Stated Goal: back to normal OT Goal Formulation: With patient Time For Goal Achievement: 04/20/22  Potential to Achieve Goals: Good  OT Frequency: Min 2X/week    Co-evaluation PT/OT/SLP Co-Evaluation/Treatment: Yes (co-eval) Reason for Co-Treatment: To address functional/ADL transfers          AM-PAC OT "6 Clicks" Daily Activity     Outcome Measure Help from another person eating meals?: None Help from another person taking care of personal grooming?: A Little Help from another person toileting, which includes using toliet, bedpan, or urinal?:  Total Help from another person bathing (including washing, rinsing, drying)?: A Lot Help from another person to put on and taking off regular upper body clothing?: A Lot Help from another person to put on and taking off regular lower body clothing?: Total 6 Click Score: 13   End of Session Equipment Utilized During Treatment: Other (comment) (stedy) Nurse Communication: Mobility status  Activity Tolerance:   Patient left: in bed;with call bell/phone within reach;with bed alarm set  OT Visit Diagnosis: Muscle weakness (generalized) (M62.81)                Time: 8527-7824 OT Time Calculation (min): 32 min Charges:  OT General Charges $OT Visit: 1 Visit OT Evaluation $OT Eval Low Complexity: 1 Low  Liborio Saccente, OTR/L [place]  Office [phone] Pager: [place] 04/06/2022, 1:21 PM

## 2022-04-06 NOTE — Assessment & Plan Note (Signed)
  #)   Allergic Rhinitis: documented h/o such, on scheduled Claritin as outpatient, will noting no scheduled intranasal corticosteroid.    Plan: cont home Claritin.

## 2022-04-06 NOTE — Assessment & Plan Note (Signed)
#)   Generalized weakness: 2 to 3 weeks gross of generalized weakness resulting in diminished ambulatory ability relative to associated baseline in which she is able to ambulate with the aid of a walker.  Appears compounded by interval worsening of edema in the bilateral lower extremities along with the associated weight gain.  We will pursue IV diuresis efforts consult PT/OT, and evaluate for any additional contributing factors, as outlined below .  Plan: Check urinalysis, TSH.  Physical therapy/Occupational Therapy consults have been placed for the morning.  CMP and CBC in the morning.  Add on serum magnesium level.  Further evaluation and management of presenting acute on chronic diastolic heart failure, as above.

## 2022-04-06 NOTE — H&P (Signed)
History and Physical    PLEASE NOTE THAT DRAGON DICTATION SOFTWARE WAS USED IN THE CONSTRUCTION OF THIS NOTE.   Carol Martin GUR:[id number] DOB: 20-Jan-1941 DOA: 04/05/2022  PCP: Nolene Ebbs, MD  Patient coming from: home   I have personally briefly reviewed patient's old medical records in [place]  Chief Complaint: Worsening edema in bilateral lower extremities  HPI: Carol Martin is a 81 y.o. female with medical history significant for chronic diastolic heart failure, allergic rhinitis, essential hypertension, who is admitted to [facility] on 04/05/2022 with suspected acute on chronic diastolic heart failure after presenting from home to [facility] ED complaining of worsening of edema in the bilateral lower extremities.   The following history is obtained via my discussions with the patient as well as my discussions with the EDP and via chart review.  The patient reports progressive worsening of edema in the bilateral lower extremities over the course of the last 2 to 3 weeks.  She notes that this has been associated with weight gain over that timeframe, but is unable to specifically quantify this report.  Denies any associated chest pain, shortness of breath, palpitations, diaphoresis, dizziness, presyncope, or syncope.  Denies any associated significant calf tenderness or new lower extremity erythema over that timeframe.  No recent trauma or travel.  The patient has a history of chronic diastolic heart failure, with most recent echocardiogram, per my chart review, noted to have occurred in June 2015, with results at that time notable for LVEF 55%, severe LVH, mildly dilated left ventricular cavity mildly dilated left atrium, and mild aortic regurgitation.  Preceding echocardiogram in May 2014 was notable for grade 1 diastolic dysfunction.  The patient conveys that she has been intermittently prescribed loop diuretics for her chronic diastolic heart failure, but notes a degree  of suboptimal compliance with routinely taking these medications.  Per chart review, there is also documentation of a history of chronic venous insufficiency.   Over the last 2 to 3 weeks, the patient also notes worsening generalized weakness In the absence of any associated acute focal weakness, acute focal numbness, paresthesias, facial droop, slurred speech, expressive aphasia, acute change in vision, dysphagia, vertigo.  Denies any associated subjective fever, chills, rigors, or generalized myalgias.  No associate with any dysuria or gross hematuria.  Denies headache or neck stiffness.  No associated any new cough, abdominal pain, diarrhea. In the setting of this generalized weakness, the patient reports that it has been more difficult to relative to her baseline ambulatory ability in which she is able to ambulate with the aid of a walker.  Denies any associated recent falls.      ED Course:  Vital signs in the ED were notable for the following: Afebrile; heart rate 71-88; blood pressure 133/76; respiratory rate 18-22, oxygen saturation 95 to 100% on room air.  Labs were notable for the following: CMP notable for the following: Creatinine 0.68 relative to most recent prior serum creatinine data point of 0.75 in August 2022, liver enzymes within normal limits.  BNP 81 compared to most recent prior value of 105 in August 2021.  CBC notable for white blood cell count 6200.  Urinalysis has been ordered, with result currently pending.  Imaging and additional notable ED work-up: EKG shows sinus rhythm with first-degree AV block, heart rate 80, PR interval 221, T wave inversion in leads I and aVL, which appears unchanged relative to most recent prior EKG from 06/25/2020, and shows no evidence of  ST changes, including no evidence of ST elevation.  Chest x-ray shows stable cardiomegaly with mild left basilar scarring versus atelectasis, in the absence of any evidence of infiltrate, edema, effusion, or  pneumothorax.  While in the ED, the following were administered: Lasix 40 mg IV x1  Subsequently, the patient was admitted for further evaluation and management of suspected acute on chronic diastolic heart failure after presenting with worsening edema in the bilateral lower extremities along with generalized weakness.     Review of Systems: As per HPI otherwise 10 point review of systems negative.   Past Medical History:  Diagnosis Date   Abnormal EKG    Allergic rhinitis    Alzheimer's disease ([place]) 05/17/07   Arthritis of knee    Breast cancer (HCC)    Diastolic dysfunction    Dilated aortic root (HCC)    GERD (gastroesophageal reflux disease)    Hypertension    Kidney stones    Mild aortic insufficiency    Morbid obesity (HCC)    Sleep apnea    with insomnia   Venous (peripheral) insufficiency     Past Surgical History:  Procedure Laterality Date   CESAREAN SECTION     DILATION AND CURETTAGE OF UTERUS     ECTOPIC PREGNANCY SURGERY     KIDNEY STONE SURGERY     REMOVAL   MASTECTOMY     R breast    TRACHEOSTOMY      Social History:  reports that she has never smoked. She has never used smokeless tobacco. She reports that she does not drink alcohol and does not use drugs.   No Known Allergies  Family History  Problem Relation Age of Onset   Hypertension Mother    Hypertension Sister    Cancer Sister        Unknown cancer    Family history reviewed and not pertinent    Prior to Admission medications   Medication Sig Start Date End Date Taking? Authorizing Provider  aspirin-acetaminophen-caffeine (EXCEDRIN MIGRAINE) [id number] MG tablet Take 1 tablet by mouth every 6 (six) hours as needed for headache.   Yes [provider]  lisinopril-hydrochlorothiazide (PRINZIDE,ZESTORETIC) 20-12.5 MG tablet Take 1 tablet by mouth daily.   Yes [provider]  loratadine (CLARITIN) 10 MG tablet Take 10 mg by mouth daily. 04/16/20  Yes [provider]  nystatin (MYCOSTATIN/NYSTOP) powder Apply 1 application topically daily as needed (itching/rash).   Yes [provider]  polyvinyl alcohol (LIQUIFILM TEARS) 1.4 % ophthalmic solution Place 1 drop into both eyes daily as needed for dry eyes.   Yes [provider]  potassium chloride (K-DUR,KLOR-CON) 10 MEQ tablet Take 10 mEq by mouth daily as needed (with each dose of torsemide).    Yes [provider]  triamcinolone (KENALOG) 0.025 % cream Apply 1 application topically daily as needed (rash/itching).  03/08/18  Yes [provider]  verapamil (CALAN-SR) 180 MG CR tablet Take 180 mg by mouth daily.  08/10/17  Yes [provider]  Vitamin D, Ergocalciferol, (DRISDOL) 1.25 MG (50000 UNIT) CAPS capsule Take 50,000 Units by mouth every Sunday. 05/09/20  Yes [provider]     Objective    Physical Exam: Vitals:   04/05/22 2145 04/05/22 2337 04/06/22 0000 04/06/22 0100  BP:  129/79    Pulse:  86    Resp: '17 19 19 18  '$ Temp:  98.2 F (36.8 C)    TempSrc:  Oral    SpO2:  97%  Weight:  (!) 141.4 kg      General: appears to be stated age; alert, oriented Skin: warm, dry, no rash Head:  AT/[place] Mouth:  Oral mucosa membranes appear moist, normal dentition Neck: supple; trachea midline Heart:  RRR; did not appreciate any M/R/G Lungs: CTAB, did not appreciate any wheezes, rales, or rhonchi Abdomen: + BS; soft, ND, NT Vascular: 2+ pedal pulses b/l; 2+ radial pulses b/l Extremities: 2+ edema in b/l LE's, no muscle wasting Neuro: strength and sensation intact in upper and lower extremities b/l    Labs on Admission: I have personally reviewed following labs and imaging studies  CBC: Recent Labs  Lab 04/05/22 1923  WBC 6.2  NEUTROABS 3.1  HGB 14.6  HCT 45.1  MCV 97.2  PLT 604   Basic Metabolic Panel: Recent Labs  Lab 04/05/22 1923  NA 140  K 4.1  CL 108  CO2 26  GLUCOSE 102*  BUN 5*  CREATININE 0.68  CALCIUM  8.9  MG 2.4   GFR: CrCl cannot be calculated (Unknown ideal weight.). Liver Function Tests: Recent Labs  Lab 04/05/22 1923  AST 22  ALT 9  ALKPHOS 81  BILITOT 0.6  PROT 6.9  ALBUMIN 3.4*   No results for input(s): LIPASE, AMYLASE in the last 168 hours. No results for input(s): AMMONIA in the last 168 hours. Coagulation Profile: No results for input(s): INR, PROTIME in the last 168 hours. Cardiac Enzymes: No results for input(s): CKTOTAL, CKMB, CKMBINDEX, TROPONINI in the last 168 hours. BNP (last 3 results) No results for input(s): PROBNP in the last 8760 hours. HbA1C: No results for input(s): HGBA1C in the last 72 hours. CBG: No results for input(s): GLUCAP in the last 168 hours. Lipid Profile: No results for input(s): CHOL, HDL, LDLCALC, TRIG, CHOLHDL, LDLDIRECT in the last 72 hours. Thyroid Function Tests: No results for input(s): TSH, T4TOTAL, FREET4, T3FREE, THYROIDAB in the last 72 hours. Anemia Panel: No results for input(s): VITAMINB12, FOLATE, FERRITIN, TIBC, IRON, RETICCTPCT in the last 72 hours. Urine analysis:    Component Value Date/Time   COLORURINE YELLOW 06/23/2020 1717   APPEARANCEUR CLEAR 06/23/2020 1717   LABSPEC 1.010 06/23/2020 1717   PHURINE 7.0 06/23/2020 1717   GLUCOSEU NEGATIVE 06/23/2020 1717   HGBUR SMALL (A) 06/23/2020 1717   BILIRUBINUR NEGATIVE 06/23/2020 1717   KETONESUR NEGATIVE 06/23/2020 1717   PROTEINUR NEGATIVE 06/23/2020 1717   UROBILINOGEN 0.2 05/23/2014 1548   NITRITE NEGATIVE 06/23/2020 1717   LEUKOCYTESUR NEGATIVE 06/23/2020 1717    Radiological Exams on Admission: DG Chest 2 View  Result Date: 04/05/2022 CLINICAL DATA:  History of breast cancer with bilateral feet swelling and abdominal swelling. EXAM: CHEST - 2 VIEW COMPARISON:  June 23, 2020 FINDINGS: The cardiac silhouette is mildly enlarged and unchanged in size. Mild, stable linear scarring and/or atelectasis is seen within the left lung base. There is no evidence  of focal consolidation, pleural effusion or pneumothorax. The visualized skeletal structures are unremarkable. IMPRESSION: Stable cardiomegaly with mild left basilar linear scarring and/or atelectasis. Electronically Signed   By: Virgina Norfolk M.D.   On: 04/05/2022 20:49     EKG: Independently reviewed, with result as described above.    Assessment/Plan    Principal Problem:   Acute on chronic diastolic heart failure (HCC) Active Problems:   Essential hypertension   Generalized weakness   Allergic rhinitis      #) Acute on chronic diastolic heart failure: Suspected dx of acute decompensation on the basis of presenting  to 3 weeks of progressive worsening of edema in the bilateral lower extremities associated with weight gain over that timeframe. This is in the context of a known history of chronic diastolic heart failure, with most recent echocardiogram performed June 2015, which is notable for LVEF 55% along with severe LVH, with the latter associated with increased risk for ensuing development of systolic dysfunction.  Echocardiogram in May 2014 was notable for grade 1 diastolic dysfunction, as further detailed above.  Etiology leading to presenting acutely decompensated heart failure is currently unclear, although the patient does convey an element of suboptimal compliance with intermittent diuretic use at home.  Aside from HCTZ, she conveys that she is not currently taking any scheduled diuretics.   Overall, ACS leading to presenting acutely decompensated heart failure appears less likely at this time in the absence of any recent CP, presenting EKG showing no evidence of acute ischemic changes.  No evidence of associated respiratory distress, and chest x-ray shows no evidence of acute cardiopulmonary process.   Of note, patient received Lasix 40 mg IV x1 while in the ED today. Presentation warrants additional IV diuresis, as further detailed below, with close monitoring of ensuing  renal function, electrolytes, and volume status, as further noted below.   Given that has been 8 years since most recent echocardiogram, with most recent prior echo showing severe LVH, increasing risk for ensuing development of systolic dysfunction, and in the setting of suspected acutely decompensated heart failure at time of this evening's presentation, will pursue updated echocardiogram, as further detailed below.  Factors that may also be contributing to her worsening edema bilateral extremities is a documented history of chronic venous insufficiency.  We will also check INR to evaluate synthetic hepatic function.   Plan: monitor strict I's & O's and daily weights. Monitor on telemetry. Monitor continuous pulse oximetry. Repeat BMP in the morning, including for monitoring trend of potassium, bicarbonate, and renal function in response to interval diuresis efforts. Check serum magnesium level. Close monitoring of ensuing blood pressure response to diuresis efforts, including to help guide need for improvement in afterload reduction in order to optimize cardiac output. Lasix 40 mg IV twice daily. Continue outpatient ACE-I .  Hold home HCTZ for now in the setting of IV diuresis efforts. Will also hold outpatient verapamil for now given presenting EKG indicating first-degree AV block.  Echocardiogram in the morning.  Check INR.          #) Generalized weakness: 2 to 3 weeks gross of generalized weakness resulting in diminished ambulatory ability relative to associated baseline in which she is able to ambulate with the aid of a walker.  Appears compounded by interval worsening of edema in the bilateral lower extremities along with the associated weight gain.  We will pursue IV diuresis efforts consult PT/OT, and evaluate for any additional contributing factors, as outlined below .  Plan: Check urinalysis, TSH.  Physical therapy/Occupational Therapy consults have been placed for the morning.  CMP and  CBC in the morning.  Add on serum magnesium level.  Further evaluation and management of presenting acute on chronic diastolic heart failure, as above.            #) Allergic Rhinitis: documented h/o such, on scheduled Claritin as outpatient, will noting no scheduled intranasal corticosteroid.    Plan: cont home Claritin.           #) Essential Hypertension: documented h/o such, with outpatient antihypertensive regimen including lisinopril, HCTZ, and verapamil.  SBP's  in the ED today: Normotensive.  We will closely monitor ensuing blood pressure, particularly in response to IV diuresis efforts.  Additionally, in the setting of these IV diuresis efforts, will hold home HCTZ for now.  Additionally, in the setting of evidence of first-degree AV block, will hold home verapamil for now.  Plan: Close monitoring of subsequent BP via routine VS. continue home lisinopril will holding home HCTZ and verapamil for now.        DVT prophylaxis: SCD's   Code Status: Full code Family Communication: none Disposition Plan: Per Rounding Team Consults called: none;  Admission status: Inpatient    PLEASE NOTE THAT DRAGON DICTATION SOFTWARE WAS USED IN THE CONSTRUCTION OF THIS NOTE.   [place] DO [facility] [place]   04/06/2022, 2:16 AM

## 2022-04-07 ENCOUNTER — Other Ambulatory Visit: Payer: Self-pay

## 2022-04-07 ENCOUNTER — Inpatient Hospital Stay (HOSPITAL_COMMUNITY): Payer: Medicare Other

## 2022-04-07 DIAGNOSIS — M79604 Pain in right leg: Secondary | ICD-10-CM

## 2022-04-07 DIAGNOSIS — M79605 Pain in left leg: Secondary | ICD-10-CM

## 2022-04-07 DIAGNOSIS — I872 Venous insufficiency (chronic) (peripheral): Secondary | ICD-10-CM

## 2022-04-07 LAB — BASIC METABOLIC PANEL
Anion gap: 7 (ref 5–15)
BUN: 6 mg/dL — ABNORMAL LOW (ref 8–23)
CO2: 30 mmol/L (ref 22–32)
Calcium: 8.7 mg/dL — ABNORMAL LOW (ref 8.9–10.3)
Chloride: 106 mmol/L (ref 98–111)
Creatinine, Ser: 0.77 mg/dL (ref 0.44–1.00)
GFR, Estimated: >60 mL/min (ref >60)
Glucose, Bld: 108 mg/dL — ABNORMAL HIGH (ref 70–99)
Potassium: 3.7 mmol/L (ref 3.5–5.1)
Sodium: 143 mmol/L (ref 135–145)

## 2022-04-07 NOTE — Clinical Social Work Note (Signed)
CHF or Chronic Pulmonary Condition  Patient suffers from CHF and has trouble breathing at night when head is elevated less than _30_ degrees.  Bed wedges do not provide enough elevation to resolve breathing issues.  Difficult breathing and Edema cause patient to require frequent changes in body position which cannot be achieved with a normal bed.

## 2022-04-07 NOTE — Progress Notes (Signed)
PROGRESS NOTE   Carol Martin  WJX:[id number]    DOB: 07/28/1941    DOA: 04/05/2022  PCP: Nolene Ebbs, MD   I have briefly reviewed patients previous medical records in [facility].  Chief Complaint  Patient presents with   Foot Swelling    Brief Narrative:  81 year old female, lives with her daughter, ambulates with the help of a walker, medical history significant for extreme hard of hearing, very morbid obesity, OSA, not sure if compliant with CPAP, peripheral venous insufficiency, hypertension, mild Alzheimer's disease, GERD, dilated aortic root, seizures not on medications, chronic diastolic CHF, [id number] pneumonia in August 2021, severe end-stage arthritis of both knees and not a surgical candidate, presented to the ED on 04/05/2022 with 1 month history of progressive bilateral lower extremity swelling, weight gain of unknown amount, generalized weakness and ambulatory dysfunction.  Admitted for anasarca, likely multifactorial, predominantly from worsening of lower extremity venous insufficiency, hypoalbuminemia and an element of decompensated CHF.  IV Lasix, slowly improving.  Hopeful DC 6/6, therapies recommend PT but patient/family leaning towards going home per [facility]   Assessment & Plan:  Principal Problem:   Acute on chronic diastolic heart failure (HCC) Active Problems:   Essential hypertension   Generalized weakness   Allergic rhinitis   Anasarca: DD: Likely worsening of lower extremity chronic venous insufficiency in the context of noncompliance to home diuretics and not using lower extremity compressive stockings Vs acute on chronic diastolic CHF-may have a small element of this, less likely to be right heart failure given echo findings and hypoalbuminemia.  No dyspnea, lungs clear to auscultation, chest x-ray negative and BNP normal (however very morbidly obese).  TTE: LVEF 55-60%, mild LVH, grade 1 diastolic dysfunction.  Low sodium and fluid restriction 1500 mL/day.   Strict intake output and daily weights.  Try lower extremity compressive stockings if can fit-unfortunately none available, nursing trying to get a custom fit if available but otherwise requested daughter at bedside to try and find one online.  Continue IV Lasix 40 mg twice daily.  -2.8 L thus far but this may not be fully accurate since she had unmeasured urine output multiple times as well.  No real change in weight but not sure if this is accurate.  Monitor daily BMP.  Slowly improving but still quite volume overloaded.  Hopeful for another day of IV Lasix and then can transition to oral diuretics.  Counseled patient and daughter at bedside regarding importance of taking her diuretics.  Also reports bilateral leg pain which is better, will get lower extremity venous Dopplers.  Essential hypertension: Controlled.  Continue lisinopril 20 Mg daily.  Verapamil on hold due to EKG with first-degree AV block.  GERD: Continue PPI  Peripheral venous insufficiency: Diurese as above.  Trial of lower extremity compression stockings when available  OSA: Stopped using CPAP several years ago.  Will need to get retested and refitted as needed.  Extremely hard of hearing:  Per daughter, yet to get hearing aids.  Reportedly is going to see ENT.  Generalized weakness/ambulatory dysfunction: Likely multifactorial.  Therapies recommend SNF.  As per discussion with TOC, family leaning towards taking her home even though it has been expressed to them by multiple medical staff that it required up to 4 people to assist patient this morning.  Mild aortic root dilatation, 39 mm: Noted on 2D echo 6/4.  Outpatient follow-up.  Body mass index is 58.78 kg/m./Very morbid obesity    DVT prophylaxis: Place TED hose Start:  04/06/22 1436 enoxaparin (LOVENOX) injection 40 mg Start: 04/05/22 2230     Code Status: Full Code:  Family Communication: None at bedside Disposition:  Status is: Inpatient IV Lasix.      Consultants:   None  Procedures:     Antimicrobials:      Subjective:  States that swelling and pain in legs is better.  This is the first time she is telling me about pain in the legs.  Continues to deny dyspnea.  As per daughter, she had been placing diuretic pill in her pillbox but patient herself had not been taking it.  Has not been using CPAP for several years.  Unclear reason.  Objective:   Vitals:   04/06/22 1929 04/07/22 0333 04/07/22 0335 04/07/22 1031  BP: 139/86 (!) 150/91  97/74  Pulse: 96 98  85  Resp: '18 20  16  '$ Temp: 100 F (37.8 C) 98.9 F (37.2 C)  98.1 F (36.7 C)  TempSrc: Oral Oral  Oral  SpO2: 95% 96%    Weight:   (!) 141.1 kg     General exam: Elderly female, moderately built, very morbidly obese sitting up comfortably in bed without distress. Respiratory system: Clear to auscultation.  No increased work of breathing. Cardiovascular system: S1 & S2 heard, RRR. No JVD, murmurs, rubs, gallops or clicks.  Volume status difficult to objectively assess due to body habitus.  Bilateral leg 1+ edema extending to lower anterior abdominal wall with some peau d' Orange like looked but without cellulitis features.  All these findings are at least somewhat better compared to yesterday.  No presacral edema. Gastrointestinal system: Abdomen is obese with extensive pannus, soft and nontender. No organomegaly or masses felt. Normal bowel sounds heard. Central nervous system: Alert and oriented. No focal neurological deficits.  Extremely hard of hearing Extremities: Symmetric 5 x 5 power. Skin: No rashes, lesions or ulcers Psychiatry: Judgement and insight appear normal. Mood & affect appropriate.     Data Reviewed:   I have personally reviewed following labs and imaging studies   CBC: Recent Labs  Lab 04/05/22 1923 04/06/22 0505  WBC 6.2 5.8  NEUTROABS 3.1 2.9  HGB 14.6 14.0  HCT 45.1 43.4  MCV 97.2 97.5  PLT 205 532    Basic Metabolic  Panel: Recent Labs  Lab 04/05/22 1923 04/06/22 0505 04/07/22 0441  NA 140 142 143  K 4.1 3.6 3.7  CL 108 107 106  CO2 '26 29 30  '$ GLUCOSE 102* 103* 108*  BUN 5* <5* 6*  CREATININE 0.68 0.74 0.77  CALCIUM 8.9 8.6* 8.7*  MG 2.4 2.3  --     Liver Function Tests: Recent Labs  Lab 04/05/22 1923 04/06/22 0505  AST 22 21  ALT 9 10  ALKPHOS 81 69  BILITOT 0.6 1.0  PROT 6.9 6.4*  ALBUMIN 3.4* 3.1*    CBG: No results for input(s): GLUCAP in the last 168 hours.  Microbiology Studies:  No results found for this or any previous visit (from the past 240 hour(s)).  Radiology Studies:  DG Chest 2 View  Result Date: 04/05/2022 CLINICAL DATA:  History of breast cancer with bilateral feet swelling and abdominal swelling. EXAM: CHEST - 2 VIEW COMPARISON:  June 23, 2020 FINDINGS: The cardiac silhouette is mildly enlarged and unchanged in size. Mild, stable linear scarring and/or atelectasis is seen within the left lung base. There is no evidence of focal consolidation, pleural effusion or pneumothorax. The visualized skeletal structures are unremarkable. IMPRESSION: Stable  cardiomegaly with mild left basilar linear scarring and/or atelectasis. Electronically Signed   By: Virgina Norfolk M.D.   On: 04/05/2022 20:49   ECHOCARDIOGRAM COMPLETE  Result Date: 04/06/2022    ECHOCARDIOGRAM REPORT   Patient Name:   Carol Martin Date of Exam: 04/06/2022 Medical Rec #:  [id number]       Height:       61.0 in Accession #:    [id number]      Weight:       316.1 lb Date of Birth:  [birth year]        BSA:          2.294 m Patient Age:    68 years        BP:           127/70 mmHg Patient Gender: F               HR:           83 bpm. Exam Location:  Inpatient Procedure: 2D Echo, Cardiac Doppler and Color Doppler Indications:    CHF  History:        Patient has no prior history of Echocardiogram examinations.                 Risk Factors:Hypertension and Sleep Apnea.  Sonographer:    Jyl Heinz Referring Phys:  [id number] Rhetta Mura  Sonographer Comments: Patient is morbidly obese. Image acquisition challenging due to patient body habitus. IMPRESSIONS  1. Left ventricular ejection fraction, by estimation, is 55 to 60%. The left ventricle has normal function. The left ventricle has no regional wall motion abnormalities. There is mild concentric left ventricular hypertrophy. Left ventricular diastolic parameters are consistent with Grade I diastolic dysfunction (impaired relaxation).  2. Right ventricular systolic function is normal. The right ventricular size is normal. Tricuspid regurgitation signal is inadequate for assessing PA pressure.  3. The mitral valve is normal in structure. No evidence of mitral valve regurgitation.  4. The aortic valve is tricuspid. Aortic valve regurgitation is trivial. No aortic stenosis is present.  5. Aortic dilatation noted. There is mild dilatation of the aortic root, measuring 39 mm.  6. The inferior vena cava is dilated in size with >50% respiratory variability, suggesting right atrial pressure of 8 mmHg. FINDINGS  Left Ventricle: Left ventricular ejection fraction, by estimation, is 55 to 60%. The left ventricle has normal function. The left ventricle has no regional wall motion abnormalities. The left ventricular internal cavity size was normal in size. There is  mild concentric left ventricular hypertrophy. Left ventricular diastolic parameters are consistent with Grade I diastolic dysfunction (impaired relaxation). Indeterminate filling pressures. Right Ventricle: The right ventricular size is normal. Right vetricular wall thickness was not well visualized. Right ventricular systolic function is normal. Tricuspid regurgitation signal is inadequate for assessing PA pressure. Left Atrium: Left atrial size was normal in size. Right Atrium: Right atrial size was normal in size. Pericardium: There is no evidence of pericardial effusion. Mitral Valve: The mitral valve is normal in  structure. No evidence of mitral valve regurgitation. Tricuspid Valve: The tricuspid valve is normal in structure. Tricuspid valve regurgitation is not demonstrated. Aortic Valve: The aortic valve is tricuspid. Aortic valve regurgitation is trivial. Aortic regurgitation PHT measures 464 msec. No aortic stenosis is present. Aortic valve mean gradient measures 4.0 mmHg. Aortic valve peak gradient measures 7.3 mmHg. Aortic valve area, by VTI measures 2.36 cm. Pulmonic Valve: The pulmonic valve was grossly normal. Pulmonic valve  regurgitation is not visualized. Aorta: Aortic dilatation noted. There is mild dilatation of the aortic root, measuring 39 mm. Venous: The inferior vena cava is dilated in size with greater than 50% respiratory variability, suggesting right atrial pressure of 8 mmHg. IAS/Shunts: No atrial level shunt detected by color flow Doppler.  LEFT VENTRICLE PLAX 2D LVIDd:         4.70 cm      Diastology LVIDs:         3.00 cm      LV e' medial:    4.73 cm/s LV PW:         1.30 cm      LV E/e' medial:  10.1 LV IVS:        1.30 cm      LV e' lateral:   4.13 cm/s LVOT diam:     2.30 cm      LV E/e' lateral: 11.5 LV SV:         54 LV SV Index:   23 LVOT Area:     4.15 cm  LV Volumes (MOD) LV vol d, MOD A2C: 114.0 ml LV vol d, MOD A4C: 125.0 ml LV vol s, MOD A2C: 50.6 ml LV vol s, MOD A4C: 52.4 ml LV SV MOD A2C:     63.4 ml LV SV MOD A4C:     125.0 ml LV SV MOD BP:      68.8 ml RIGHT VENTRICLE             IVC RV Basal diam:  3.10 cm     IVC diam: 2.30 cm RV Mid diam:    3.70 cm RV S prime:     12.90 cm/s TAPSE (M-mode): 2.2 cm LEFT ATRIUM             Index        RIGHT ATRIUM           Index LA diam:        3.20 cm 1.39 cm/m   RA Area:     12.80 cm LA Vol (A2C):   37.5 ml 16.34 ml/m  RA Volume:   24.40 ml  10.63 ml/m LA Vol (A4C):   40.4 ml 17.61 ml/m LA Biplane Vol: 40.4 ml 17.61 ml/m  AORTIC VALVE AV Area (Vmax):    2.29 cm AV Area (Vmean):   2.22 cm AV Area (VTI):     2.36 cm AV Vmax:            135.24 cm/s AV Vmean:          93.371 cm/s AV VTI:            0.227 m AV Peak Grad:      7.3 mmHg AV Mean Grad:      4.0 mmHg LVOT Vmax:         74.55 cm/s LVOT Vmean:        49.818 cm/s LVOT VTI:          0.129 m LVOT/AV VTI ratio: 0.57 AI PHT:            464 msec  AORTA Ao Root diam: 3.90 cm Ao Asc diam:  3.60 cm MITRAL VALVE MV Area (PHT): 6.27 cm    SHUNTS MV Decel Time: 121 msec    Systemic VTI:  0.13 m MV E velocity: 47.60 cm/s  Systemic Diam: 2.30 cm MV A velocity: 69.80 cm/s MV E/A ratio:  0.68 Mihai Croitoru MD Electronically signed by Sanda Klein  MD Signature Date/Time: 04/06/2022/2:33:36 PM    Final     Scheduled Meds:    enoxaparin (LOVENOX) injection  40 mg Subcutaneous QHS   furosemide  40 mg Intravenous BID   lisinopril  20 mg Oral Daily   loratadine  10 mg Oral Daily   pantoprazole  40 mg Oral Daily    Continuous Infusions:     LOS: 2 days     Vernell Leep, MD,  FACP, [facility], [facility], [facility] (Care Management Physician Certified) [place]  To contact the attending provider between 7A-7P or the covering provider during after hours 7P-7A, please log into the web site [email or web address] and access using universal Post password for that web site. If you do not have the password, please call the hospital operator.  04/07/2022, 12:03 PM

## 2022-04-07 NOTE — Plan of Care (Signed)
  Problem: Education: Goal: Ability to verbalize understanding of medication therapies will improve Outcome: Progressing   

## 2022-04-07 NOTE — Progress Notes (Signed)
Bilateral lower extremity venous duplex has been completed. Preliminary results can be found in CV Proc through chart review.   04/07/22 2:33 PM Carlos Levering RVT

## 2022-04-07 NOTE — TOC Initial Note (Signed)
Transition of Care [facility]) - Initial/Assessment Note    Patient Details  Name: Carol Martin MRN: [id number] Date of Birth: 1941-01-22  Transition of Care [facility]) CM/SW Contact:    Ross Ludwig, LCSW Phone Number: 04/07/2022, 3:47 PM  Clinical Narrative:                  Patient is an 81 year old female who is alert and oriented x4.  Patient lives with her daughter, but hard of hearing.  CSW completed assessment by speaking to patient's daughter [place].  Patient's daughter stated that patient has been living with her.  CSW spoke to patient's daughter regarding PT recommendation for short term rehab at [facility].  Patient's daughter discussed with her siblings and they have decided they would rather her go home with home health instead of going to a SNF.  CSW informed her that PT is recommending short term rehab to get her strength back up, however they still would rather her just come back home.  Per daughter, there are several family members who are willing to step in to help patient return back home.  CSW was able to get [facility] to agree to accept patient for home health PT, OT, RN, aide, and social worker.  CSW asked patient's daughter if they needed any equipment, per patient's daughter they have a walker and a bedside commode, however they will need a hospital bed.  CSW asked physician to put order in for bed, and he was able to.  CSW contacted [employer] who will talk with family and make arrangements for hospital bed to be delivered once patient is ready for discharge.  CSW asked daughter if they will need EMS transport home, she is having a conference call with the rest of the family this evening to discuss what the plan is to take care of patient.  TOC to follow up with patient's daughter tomorrow.  They are also going to discuss what address patient will be going to this evening in the conference call.  TOC to continue to follow patient's progress throughout discharge planning.  Expected  Discharge Plan: Power Barriers to Discharge: Continued Medical Work up   Patient Goals and CMS Choice Patient states their goals for this hospitalization and ongoing recovery are:: To return back home with home health. CMS Medicare.gov Compare Post Acute Care list provided to:: Patient Represenative (must comment) Choice offered to / list presented to : Adult Children  Expected Discharge Plan and Services Expected Discharge Plan: [place] In-house Referral: Clinical Social Work   Post Acute Care Choice: Durable Medical Equipment, Home Health Living arrangements for the past 2 months: [place]                 DME Arranged: Hospital bed DME Agency: [employer] Date DME Agency Contacted: 04/07/22 Time DME Agency Contacted: 42 Representative spoke with at DME Agency: Andee Poles HH Arranged: RN, PT, OT, Nurse's Aide, Social Work [employer] Agency: [place] Date [place]: 04/07/22 Time [place]: [place] Representative spoke with at [place]: Malachy Mood  Prior Living Arrangements/Services Living arrangements for the past 2 months: [place] Lives with:: Adult Children Patient language and need for interpreter reviewed:: Yes Do you feel safe going back to the place where you live?: Yes      Need for Family Participation in Patient Care: Yes (Comment) Care giver support system in place?: No (comment) Current home services:  DME Criminal Activity/Legal Involvement Pertinent to Current Situation/Hospitalization: No - Comment as needed  Activities of Daily Living      Permission Sought/Granted Permission sought to share information with : Case Manager, Family Supports Permission granted to share information with : Yes, Release of Information Signed  Share Information with NAME: Janeya, [place] Daughter [phone]    [facility] Daughter   [phone]  Permission granted to share info w  AGENCY: [facility] agency        Emotional Assessment Appearance:: Appears stated age Attitude/Demeanor/Rapport: Engaged Affect (typically observed): Accepting, Calm, Appropriate, Pleasant Orientation: : Oriented to Self, Oriented to Place, Oriented to  Time, Oriented to Situation Alcohol / Substance Use: Not Applicable Psych Involvement: No (comment)  Admission diagnosis:  Acute on chronic diastolic heart failure (HCC) [I50.33] Peripheral edema [R60.9] Patient Active Problem List   Diagnosis Date Noted   Generalized weakness 04/06/2022   Allergic rhinitis 04/06/2022   Acute on chronic diastolic heart failure ([place]) 04/05/2022   COVID-19 06/23/2020   Chronic pain of both knees 02/11/2018   Bilateral hip pain 02/11/2018   Unilateral primary osteoarthritis, left knee 02/11/2018   Unilateral primary osteoarthritis, right knee 02/11/2018   Tinea corporis 04/29/2016   Cellulitis, abdominal wall 04/29/2016   Dilated aortic root (HCC)    Chronic diastolic CHF (congestive heart failure) ([place])    Mild aortic insufficiency    Sleep apnea    Breast cancer ([place]) 02/08/2013   Abnormal EKG 05/18/2011   Dyspnea 05/18/2011   Essential hypertension 05/18/2011   Healthcare maintenance 05/18/2011   Alzheimer's disease ([place]) 05/17/2007   PCP:  Nolene Ebbs, MD Pharmacy:   [facility] [place], [place] - [address] [address] Phone: [phone] Fax: [place], [place] [address] [place] [address] Phone: [phone] Fax: [phone]  [facility] DR[facility] STORE [place], [place] - [address] [place] [address] Phone: [phone] Fax: [phone]     Social Determinants of Health (SDOH) Interventions    Readmission Risk Interventions      View : No data to display.

## 2022-04-08 DIAGNOSIS — R609 Edema, unspecified: Secondary | ICD-10-CM

## 2022-04-08 NOTE — Progress Notes (Signed)
Occupational Therapy Treatment Patient Details Name: Carol Martin MRN: [id number] DOB: 11-21-1940 Today's Date: 04/08/2022   History of present illness 81 y.o. female with admitted with sepsis, edema, CHF exacerbation. History significant for chronic diastolic heart failure, allergic rhinitis, essential hypertension, morbid obesity, falls, COVID, breast ca, dementia   OT comments  Unfortunately, pt unable to demonstrate progress towards OT goals this session as pt unable to clear hips from bed with Sar STEDY and Max As of 1 person x 4 attempts from elevated EOB. Pt showing safety awareness deficits with need of Max cues and LE blocking to keep pt safe at EOB as pt frequently leaning posteriorly and sliding hips forward towards EOB. Pt required Les blocked for safety and asking OT to "move your leg".  Pt is very HOH and disoriented to year. Difficult to assess cognitive deficits vs hearing deficits. Very concerned re: pt going home and believe pt would benefit from post-acute inpatient rehab.  Will continue to work with pt towards increasing her ability to assist with her care and functional mobility.   Recommendations for follow up therapy are one component of a multi-disciplinary discharge planning process, led by the attending physician.  Recommendations may be updated based on patient status, additional functional criteria and insurance authorization.    Follow Up Recommendations  Skilled nursing-short term rehab (<3 hours/day)    Assistance Recommended at Discharge Frequent or constant Supervision/Assistance  Patient can return home with the following  Two people to help with walking and/or transfers;Two people to help with bathing/dressing/bathroom;Assistance with cooking/housework;Assist for transportation;Help with stairs or ramp for entrance;A lot of help with walking and/or transfers;A lot of help with bathing/dressing/bathroom   Equipment Recommendations  Other (comment) (Per chart,  family is refusing SNF which is concerning. If family insists on taking pt home will need Hoyer/Mechanical lift, Bariatric Hospital bed and bariatric Wheelchair.)    Recommendations for Other Services      Precautions / Restrictions Precautions Precautions: Fall Precaution Comments: frequent urination, frequent belching (assume normal), concern for skin between folds Restrictions Weight Bearing Restrictions: No       Mobility Bed Mobility Overal bed mobility: Needs Assistance Bed Mobility: Supine to Sit, Sit to Supine Rolling: Max assist (Pt required Max As for LEs and pt able to help with her trunk.)     Sit to supine: Max assist, +2 for physical assistance   General bed mobility comments: Patient managing upper body - but +2 for LEs. Use of bed mechanics for bed mobility. Pt required Total Assist of 2 for posterior scoot. Unable to assist with UEs or LEs despite cues.    Transfers                         Balance Overall balance assessment: Needs assistance Sitting-balance support: No upper extremity supported, Feet supported Sitting balance-Leahy Scale: Fair   Postural control: Posterior lean                                 ADL either performed or assessed with clinical judgement   ADL Overall ADL's : Needs assistance/impaired Eating/Feeding: Bed level;Independent   Grooming: Sitting;Wash/dry hands;Wash/dry face;Supervision/safety               Lower Body Dressing: Total assistance;Bed level Lower Body Dressing Details (indicate cue type and reason): Total Assist to remove socks at bed level due to noted socks cutting  in at ankles. Toilet Transfer: +2 for safety/equipment;+2 for physical assistance;Total assistance Toilet Transfer Details (indicate cue type and reason): Unable. Pt assisted to EOB and attempted 4 stands with Arlana Lindau, but unable to clear hips from bed with any attempts with Max As of 1. Pt confused at times, repeating "What  are we doing?" And tries to extend at trunk, increasing fall risk, therefore RN called to room for safety and return pt to supine. Toileting- Clothing Manipulation and Hygiene: Total assistance Toileting - Clothing Manipulation Details (indicate cue type and reason): Voiding frequently into pure wick.            Extremity/Trunk Assessment Upper Extremity Assessment Upper Extremity Assessment: Generalized weakness       Cervical / Trunk Assessment Cervical / Trunk Assessment: Other exceptions Cervical / Trunk Exceptions: [place]    Vision Patient Visual Report: No change from baseline     Perception     Praxis      Cognition     Overall Cognitive Status: No family/caregiver present to determine baseline cognitive functioning                                 General Comments: Oriented to all but year. Increased time to respond to questions and [facility] complicates processing. Seems to have impaired safety awareness and memory deficits. No family present.        Exercises      Shoulder Instructions       General Comments      Pertinent Vitals/ Pain       Pain Assessment Pain Assessment: No/denies pain  Home Living                                          Prior Functioning/Environment              Frequency  Min 2X/week        Progress Toward Goals  OT Goals(current goals can now be found in the care plan section)  Progress towards OT goals: Not progressing toward goals - comment (No observable progress towards OT goals.)  Acute Rehab OT Goals Patient Stated Goal: Not to fall OT Goal Formulation: With patient Time For Goal Achievement: 04/20/22 Potential to Achieve Goals: [place] Discharge plan remains appropriate    Co-evaluation                 AM-PAC OT "6 Clicks" Daily Activity     Outcome Measure   Help from another person eating meals?: None Help from another person taking care of personal  grooming?: A Little Help from another person toileting, which includes using toliet, bedpan, or urinal?: Total Help from another person bathing (including washing, rinsing, drying)?: A Lot Help from another person to put on and taking off regular upper body clothing?: A Lot Help from another person to put on and taking off regular lower body clothing?: Total 6 Click Score: 13    End of Session Equipment Utilized During Treatment: Gait belt (STEDY)  OT Visit Diagnosis: Muscle weakness (generalized) (M62.81)   Activity Tolerance Patient tolerated treatment well   Patient Left in bed;with call bell/phone within reach;with bed alarm set   Nurse Communication Mobility status        Time: 4193-7902 OT Time Calculation (min): 35 min  Charges:  OT General Charges $OT Visit: 1 Visit OT Treatments $Therapeutic Activity: 23-37 mins  Anderson Malta, OT Acute Rehab Services Office: [phone] 04/08/2022   Julien Girt 04/08/2022, 12:28 PM

## 2022-04-08 NOTE — Progress Notes (Signed)
[facility]  PROGRESS NOTE  Carol Martin TGG:[id number] DOB: 08/11/1941 DOA: 04/05/2022 PCP: Nolene Ebbs, MD   Brief HPI:   81 year old female with a history of morbid obesity, OSA, peripheral venous insufficiency, hypertension, mild eczema disease, GERD, seizures not on medications, chronic diastolic CHF, [id number] pneumonia in August 2021, severe end-stage arthritis of both knees, not a surgical candidate presented to ED on 04/05/2022 with 1 month history of progressive bilateral lower extremity swelling, weight gain of unknown amount, generalized weakness and ambulatory dysfunction.  Patient admitted for anasarca due to hypoalbuminemia with possibility of decompensated CHF.  Patient was started on IV Lasix.  PT consulted recommended rehab however patient family wants to go home     Subjective   Patient seen and examined, denies shortness of breath.   Assessment/Plan:   Anasarca -Likely due to worsening of lower extremity chronic venous insufficiency with hypoalbuminemia -Echocardiogram shows grade 1 diastolic dysfunction, EF 55 to 60% -Patient was started on IV Lasix; net -3.6 L -Patient is clinically improved, will discontinue IV Lasix -We will discharge home in a.m. on p.o. Lasix -Venous duplex of lower extremities was negative for DVT  Hypertension -Blood pressure controlled -Continue lisinopril 20 mg daily -Verapamil on hold due to EKG with first-degree AV block  GERD -Continue Protonix 40 mg daily  Peripheral venous insufficiency -Trial of lower extremity compression stockings when available for patient size  OSA -Stopped using CPAP several years ago -We need to get retested and refitted as needed  Generalized weakness -Patient family wants to take her home  Mild aortic root dilatation, 39 mm -Noted on echo from 04/06/2022, outpatient follow-up    Medications     enoxaparin (LOVENOX) injection  40 mg Subcutaneous QHS   lisinopril  20 mg Oral Daily    loratadine  10 mg Oral Daily   pantoprazole  40 mg Oral Daily     Data Reviewed:   CBG:  No results for input(s): GLUCAP in the last 168 hours.  SpO2: 90 %    Vitals:   04/07/22 1546 04/08/22 0548 04/08/22 0928 04/08/22 1310  BP: 127/80 138/71 128/86 133/75  Pulse: (!) 101 91  87  Resp: '18 16  20  '$ Temp: 100 F (37.8 C) 98.4 F (36.9 C)  98.7 F (37.1 C)  TempSrc: Oral Oral  Oral  SpO2: 96% 90%    Weight:          Data Reviewed:  Basic Metabolic Panel: Recent Labs  Lab 04/05/22 1923 04/06/22 0505 04/07/22 0441  NA 140 142 143  K 4.1 3.6 3.7  CL 108 107 106  CO2 '26 29 30  '$ GLUCOSE 102* 103* 108*  BUN 5* <5* 6*  CREATININE 0.68 0.74 0.77  CALCIUM 8.9 8.6* 8.7*  MG 2.4 2.3  --     CBC: Recent Labs  Lab 04/05/22 1923 04/06/22 0505  WBC 6.2 5.8  NEUTROABS 3.1 2.9  HGB 14.6 14.0  HCT 45.1 43.4  MCV 97.2 97.5  PLT 205 208    LFT Recent Labs  Lab 04/05/22 1923 04/06/22 0505  AST 22 21  ALT 9 10  ALKPHOS 81 69  BILITOT 0.6 1.0  PROT 6.9 6.4*  ALBUMIN 3.4* 3.1*     Antibiotics: Anti-infectives (From admission, onward)    None        DVT prophylaxis: Lovenox  Code Status: Full code  Family Communication: No family at bedside   CONSULTS    Objective    Physical Examination:  General-appears in no acute distress Heart-S1-S2, regular, no murmur auscultated Lungs-clear to auscultation bilaterally, no wheezing or crackles auscultated Abdomen-soft, nontender, no organomegaly Extremities-bilateral 1+ edema in the lower extremities Neuro-alert, oriented x3, no focal deficit noted  Status is: Inpatient: [place]   [facility] If 7PM-7AM, please contact night-coverage at [email or web address], Office  [phone]   04/08/2022, 5:17 PM  LOS: 3 days

## 2022-04-08 NOTE — TOC Progression Note (Signed)
Transition of Care [facility]) - Progression Note    Patient Details  Name: Carol Martin MRN: [id number] Date of Birth: 14-Aug-1941  [facility]) CM/SW Contact  Yifan Auker, Juliann Pulse, RN Phone Number: 04/08/2022, 1:42 PM  Clinical Narrative:  Damaris Schooner to dtr [name] about d/c plans-still declines SNF-prefer home w/HHC-[facility] already following. [employer] rep [place] hospital bed today to home;habitus 3n1,habitus w/c,hoyer lift dtr in discussion if will acept d/t limited space-[employer] rep aware of order. May need PTAR @ d/c-Monica currently plans for own transport but will discuss further with family.     Expected Discharge Plan: [place] Barriers to Discharge: Continued Medical Work up  Expected Discharge Plan and Services Expected Discharge Plan: [place] In-house Referral: Clinical Social Work Discharge Planning Services: CM Consult Post Acute Care Choice: Durable Medical Equipment, [place] arrangements for the past 2 months: Single Family Home                 DME Arranged: Wheelchair manual, 3-N-1, Other see comment Harrel Lemon lift) DME Agency: [employer] Date DME Agency Contacted: 04/08/22 Time DME Agency Contacted: [phone] Representative spoke with at DME Agency: Andee Poles HH Arranged: RN, PT, OT, Nurse's Aide, Social Work [employer] Agency: [place] Date [place]: 04/07/22 Time [place]: [place] Representative spoke with at [place]: [place] ([place]) Interventions    Readmission Risk Interventions     View : No data to display.

## 2022-04-09 LAB — BASIC METABOLIC PANEL
Anion gap: 9 (ref 5–15)
BUN: 11 mg/dL (ref 8–23)
CO2: 27 mmol/L (ref 22–32)
Calcium: 8.7 mg/dL — ABNORMAL LOW (ref 8.9–10.3)
Chloride: 104 mmol/L (ref 98–111)
Creatinine, Ser: 0.72 mg/dL (ref 0.44–1.00)
GFR, Estimated: >60 mL/min (ref >60)
Glucose, Bld: 118 mg/dL — ABNORMAL HIGH (ref 70–99)
Potassium: 3.6 mmol/L (ref 3.5–5.1)
Sodium: 140 mmol/L (ref 135–145)

## 2022-04-09 MED ORDER — POTASSIUM CHLORIDE CRYS ER 10 MEQ PO TBCR: 10.0000 meq | EXTENDED_RELEASE_TABLET | Freq: Every day | ORAL | 3 refills | Status: AC

## 2022-04-09 MED ORDER — LISINOPRIL 20 MG PO TABS: 20.0000 mg | ORAL_TABLET | Freq: Every day | ORAL | 2 refills | Status: DC

## 2022-04-09 MED ORDER — FUROSEMIDE 40 MG PO TABS: 40.0000 mg | ORAL_TABLET | Freq: Every day | ORAL | 3 refills | Status: DC

## 2022-04-09 NOTE — TOC Transition Note (Signed)
Transition of Care [facility]) - CM/SW Discharge Note   Patient Details  Name: Carol Martin MRN: [id number] Date of Birth: 1941-01-29  Transition of Care [facility]) CM/SW Contact:  Dessa Phi, RN Phone Number: 04/09/2022, 2:00 PM   Clinical Narrative:See prior notes for additional info- for d/c home by PTAR once dme has arrived in home-dtr [place] will call floor for PTAR-all fomrs @ nsg station. No further CM needs.      Final next level of care: [place] Barriers to Discharge: No Barriers Identified   Patient Goals and CMS Choice Patient states their goals for this hospitalization and ongoing recovery are:: To return back home with home health. CMS Medicare.gov Compare Post Acute Care list provided to:: Patient Represenative (must comment) Choice offered to / list presented to : Adult Children  Discharge Placement                Patient to be transferred to facility by:  Corey Harold) Name of family member notified:  (Monica dtr) Patient and family notified of of transfer: 04/09/22  Discharge Plan and Services In-house Referral: Clinical Social Work Discharge Planning Services: CM Consult Post Acute Care Choice: Durable Medical Equipment, Home Health          DME Arranged: Wheelchair manual, 3-N-1, Other see comment Harrel Lemon lift) DME Agency: [employer] Date DME Agency Contacted: 04/08/22 Time DME Agency Contacted: [phone] Representative spoke with at DME Agency: [place]: RN, PT, OT, Nurse's Aide, Social Work [employer] Agency: [place] Date [place]: 04/07/22 Time [place]: [place] Representative spoke with at [place]: [place] ([place]) Interventions     Readmission Risk Interventions     View : No data to display.

## 2022-04-09 NOTE — Progress Notes (Signed)
Physical Therapy Treatment Patient Details Name: Carol Martin MRN: [id number] DOB: 11-10-1940 Today's Date: 04/09/2022   History of Present Illness 81 y.o. female with admitted with sepsis, edema, CHF exacerbation. History significant for chronic diastolic heart failure, allergic rhinitis, essential hypertension, morbid obesity, falls, COVID, breast ca, dementia    PT Comments    Patient continues to require MAX-Total +2 assist to complete bed mobility and transfer with Denna Haggard. She was unable to elevate hips enough for stedy paddles to transfer OOB. Pt returned to supine with Total Assist. Discussed at length concern for pt returning home at current level and need for PTAR transportation home as pt is not physically able or safe to complete care transfer. This therapist spoke with pt's daughter to discuss concerns and recommendations for therapy. Will continue to progress in acute setting and recommend SNF follow up.    Recommendations for follow up therapy are one component of a multi-disciplinary discharge planning process, led by the attending physician.  Recommendations may be updated based on patient status, additional functional criteria and insurance authorization.  Follow Up Recommendations  Skilled nursing-short term rehab (<3 hours/day)     Assistance Recommended at Discharge Frequent or constant Supervision/Assistance  Patient can return home with the following Assist for transportation;Help with stairs or ramp for entrance;Assistance with cooking/housework;Two people to help with bathing/dressing/bathroom;Two people to help with walking and/or transfers   Equipment Recommendations  BSC/3in1;Wheelchair (measurements PT);Wheelchair cushion (measurements PT);Other (comment);Hospital bed (mechanical lift, bariatric equipment)    Recommendations for Other Services       Precautions / Restrictions Precautions Precautions: Fall Precaution Comments: frequent urination,  frequent belching (assume normal), concern for skin between folds Restrictions Weight Bearing Restrictions: No     Mobility  Bed Mobility Overal bed mobility: Needs Assistance Bed Mobility: Supine to Sit, Sit to Supine, Rolling Rolling: +2 for physical assistance, +2 for safety/equipment, Max assist   Supine to sit: +2 for physical assistance, +2 for safety/equipment, Max assist Sit to supine: +2 for physical assistance, +2 for safety/equipment, Total assist   General bed mobility comments: MAX Assist with multimodal cues to guide hand to bed rail and bring LE's off EOB. Pt required TOTAL assist to return to supine and +3 to boost superiorly in bed.    Transfers Overall transfer level: Needs assistance   Transfers: Sit to/from Stand Sit to Stand: Max assist, +2 physical assistance, +2 safety/equipment, From elevated surface           General transfer comment: MAX+2 assist to stand from highly elevated EOB to STEDY. pt unable to anteriorly shift hips to allow for STEDY paddles to flip back for seat. pt completed 2x from EOB before return to supine. Transfer via Lift Equipment: Stedy  Ambulation/Gait                   Stairs             Wheelchair Mobility    Modified Rankin (Stroke Patients Only)       Balance Overall balance assessment: Needs assistance Sitting-balance support: Bilateral upper extremity supported, Feet supported Sitting balance-Leahy Scale: Fair Sitting balance - Comments: pt able to maintain sitting with bil UE support, posterior lean at start and pt improved. Postural control: Posterior lean Standing balance support: Reliant on assistive device for balance Standing balance-Leahy Scale: Poor  Cognition Arousal/Alertness: Awake/alert Behavior During Therapy: WFL for tasks assessed/performed Overall Cognitive Status: No family/caregiver present to determine baseline cognitive functioning                                  General Comments: oreinted to self, place, situation. pt HOH, slow processing and cues needed for safety and sequencing. Pt has oor awareness of deficits. no family present. Called pt's daugther to discuss pt's physical needs.        Exercises      General Comments        Pertinent Vitals/Pain Pain Assessment Pain Assessment: Faces Faces Pain Scale: Hurts a little bit Pain Location: knee pain Pain Descriptors / Indicators: Discomfort, Grimacing Pain Intervention(s): Monitored during session, Limited activity within patient's tolerance, Repositioned, Patient requesting pain meds-RN notified    Home Living                          Prior Function            PT Goals (current goals can now be found in the care plan section) Acute Rehab PT Goals Patient Stated Goal: less pain. PT Goal Formulation: With patient Time For Goal Achievement: 04/20/22 Potential to Achieve Goals: [place] Progress towards PT goals: Progressing toward goals    Frequency    Min 2X/week      PT Plan Current plan remains appropriate    Co-evaluation              AM-PAC PT "6 Clicks" Mobility   Outcome Measure  Help needed turning from your back to your side while in a flat bed without using bedrails?: Total Help needed moving from lying on your back to sitting on the side of a flat bed without using bedrails?: Total Help needed moving to and from a bed to a chair (including a wheelchair)?: Total Help needed standing up from a chair using your arms (e.g., wheelchair or bedside chair)?: Total Help needed to walk in hospital room?: Total Help needed climbing 3-5 steps with a railing? : Total 6 Click Score: 6    End of Session Equipment Utilized During Treatment: Gait belt Activity Tolerance: Patient limited by fatigue Patient left: in bed;with call bell/phone within reach;with bed alarm set Nurse Communication: Mobility status;Need for  lift equipment PT Visit Diagnosis: Muscle weakness (generalized) (M62.81);Difficulty in walking, not elsewhere classified (R26.2)     Time: 6644-0347 PT Time Calculation (min) (ACUTE ONLY): 36 min  Charges:  $Therapeutic Activity: 23-37 mins                     Verner Mould, DPT Acute Rehabilitation Services Office [phone] Pager [phone]  04/09/22 2:44 PM

## 2022-04-09 NOTE — Discharge Summary (Signed)
Physician Discharge Summary   Patient: Carol Martin MRN: [id number] DOB: Jan 24, 1941  Admit date:     04/05/2022  Discharge date: 04/09/22  Discharge Physician: Oswald Hillock   PCP: Nolene Ebbs, MD   Recommendations at discharge:   Follow-up PCP in 1 week to check BMP for kidney function and and potassium level Mild aortic root dilatation, 39 mm -Noted on echo from 04/06/2022, outpatient follow-up with cardiology  Discharge Diagnoses: Principal Problem:   Acute on chronic diastolic heart failure ([place]) Active Problems:   Essential hypertension   Generalized weakness   Allergic rhinitis  Resolved Problems:   * No resolved hospital problems. *  Hospital Course:  81 year old female with a history of morbid obesity, OSA, peripheral venous insufficiency, hypertension, mild eczema disease, GERD, seizures not on medications, chronic diastolic CHF, NGEXB-28 pneumonia in August 2021, severe end-stage arthritis of both knees, not a surgical candidate presented to ED on 04/05/2022 with 1 month history of progressive bilateral lower extremity swelling, weight gain of unknown amount, generalized weakness and ambulatory dysfunction.  Patient admitted for anasarca due to hypoalbuminemia with possibility of decompensated CHF.  Patient was started on IV Lasix.  PT consulted recommended rehab however patient family wants to go home   Assessment and Plan:  Anasarca -Likely due to worsening of lower extremity chronic venous insufficiency with hypoalbuminemia -Echocardiogram shows grade 1 diastolic dysfunction, EF 55 to 60% -Patient was started on IV Lasix; net -4.1 L -Patient is clinically improved, will discontinue IV Lasix -We will discharge home in a.m. on p.o. Lasix 40 mg daily -Venous duplex of lower extremities was negative for DVT   Hypertension -Blood pressure controlled -Continue lisinopril 20 mg daily -Verapamil on hold due to EKG with first-degree AV block   GERD -Continue Protonix  40 mg daily   Peripheral venous insufficiency -Trial of lower extremity compression stockings when available for patient size   OSA -Stopped using CPAP several years ago -We need to get retested and refitted as needed   Generalized weakness -Patient family wants to take her home with home health services   Mild aortic root dilatation, 39 mm -Noted on echo from 04/06/2022, outpatient follow-up         Consultants:  Procedures performed: Echocardiogram Disposition: Home Diet recommendation:  Discharge Diet Orders (From admission, onward)     Start     Ordered   04/09/22 0000  Diet - low sodium heart healthy        04/09/22 1313           Cardiac diet DISCHARGE MEDICATION: Allergies as of 04/09/2022   No Known Allergies      Medication List     STOP taking these medications    lisinopril-hydrochlorothiazide 20-12.5 MG tablet Commonly known as: ZESTORETIC   verapamil 180 MG CR tablet Commonly known as: CALAN-SR       TAKE these medications    aspirin-acetaminophen-caffeine 250-250-65 MG tablet Commonly known as: EXCEDRIN MIGRAINE Take 1 tablet by mouth every 6 (six) hours as needed for headache.   furosemide 40 MG tablet Commonly known as: Lasix Take 1 tablet (40 mg total) by mouth daily.   lisinopril 20 MG tablet Commonly known as: ZESTRIL Take 1 tablet (20 mg total) by mouth daily. Start taking on: April 10, 2022   loratadine 10 MG tablet Commonly known as: CLARITIN Take 10 mg by mouth daily.   nystatin powder Commonly known as: MYCOSTATIN/NYSTOP Apply 1 application topically daily as needed (itching/rash).  polyvinyl alcohol 1.4 % ophthalmic solution Commonly known as: LIQUIFILM TEARS Place 1 drop into both eyes daily as needed for dry eyes.   potassium chloride 10 MEQ tablet Commonly known as: KLOR-CON M Take 1 tablet (10 mEq total) by mouth daily. What changed:  when to take this reasons to take this   triamcinolone 0.025 %  cream Commonly known as: KENALOG Apply 1 application topically daily as needed (rash/itching).   Vitamin D (Ergocalciferol) 1.25 MG (50000 UNIT) Caps capsule Commonly known as: DRISDOL Take 50,000 Units by mouth every 'Sunday.               Durable Medical Equipment  (From admission, onward)           Start     Ordered   04/09/22 1330  For home use only DME Hospital bed  Once       Comments: Body habitus need bariatric bed  Question Answer Comment  Length of Need 6 Months   The above medical condition requires: Patient requires the ability to reposition frequently   Bed type Semi-electric      06'$ /07/23 1330   04/09/22 1025  For home use only DME 3 n 1  Once       Comments: [employer]   04/09/22 1024   04/09/22 1025  For home use only DME Other see comment  Once       Comments: Hoyerlift  Question:  Length of Need  Answer:  6 Months   04/09/22 1024            Follow-up Information     [facility] Follow up.   Why: Sharmon Revere will reach out to you for home visit-confirmed address on demographic sheet.Her phone number is [phone]. HH nursing/physical therapy/occupational therapy/aide/social worker,aide. Contact information: [place] [address] [phone]         [facility] Follow up.   Why: [facility] will contact you to arrange for hospital bed delivery. wheelchair;bedside commode;hoyer lift        Nolene Ebbs, MD Follow up in 1 week(s).   Specialty: Internal Medicine Why: Check BMP in 1 week.  To check your kidney function and potassium level Contact information: [place] [address] [phone]                Discharge Exam: Filed Weights   04/06/22 0434 04/07/22 0335 04/09/22 0500  Weight: (!) 143.4 kg (!) 141.1 kg (!) 137.1 kg   General-appears in no acute distress Heart-S1-S2, regular, no murmur auscultated Lungs-clear to auscultation bilaterally, no  wheezing or crackles auscultated Abdomen-soft, nontender, no organomegaly Extremities-bilateral 1+ edema in the lower extremities Neuro-alert, oriented x3, no focal deficit noted  Condition at discharge: good  The results of significant diagnostics from this hospitalization (including imaging, microbiology, ancillary and laboratory) are listed below for reference.   Imaging Studies: DG Chest 2 View  Result Date: 04/05/2022 CLINICAL DATA:  History of breast cancer with bilateral feet swelling and abdominal swelling. EXAM: CHEST - 2 VIEW COMPARISON:  June 23, 2020 FINDINGS: The cardiac silhouette is mildly enlarged and unchanged in size. Mild, stable linear scarring and/or atelectasis is seen within the left lung base. There is no evidence of focal consolidation, pleural effusion or pneumothorax. The visualized skeletal structures are unremarkable. IMPRESSION: Stable cardiomegaly with mild left basilar linear scarring and/or atelectasis. Electronically Signed   By: Virgina Norfolk M.D.   On: 04/05/2022 20:49   MM 3D  SCREEN BREAST UNI LEFT  Result Date: 03/24/2022 CLINICAL DATA:  Screening. EXAM: DIGITAL SCREENING UNILATERAL LEFT MAMMOGRAM WITH CAD AND TOMOSYNTHESIS TECHNIQUE: Left screening digital craniocaudal and mediolateral oblique mammograms were obtained. Left screening digital breast tomosynthesis was performed. The images were evaluated with computer-aided detection. COMPARISON:  Previous exam(s). ACR Breast Density Category b: There are scattered areas of fibroglandular density. FINDINGS: There are no findings suspicious for malignancy. IMPRESSION: No mammographic evidence of malignancy. A result letter of this screening mammogram will be mailed directly to the patient. RECOMMENDATION: Screening mammogram in one year. (Code:SM-B-01Y) BI-RADS CATEGORY  1: Negative. Electronically Signed   By: Franki Cabot M.D.   On: 03/24/2022 12:56  ECHOCARDIOGRAM COMPLETE  Result Date: 04/06/2022     ECHOCARDIOGRAM REPORT   Patient Name:   GINNETTE GATES Date of Exam: 04/06/2022 Medical Rec #:  [id number]       Height:       61.0 in Accession #:    [id number]      Weight:       316.1 lb Date of Birth:  Dec 12, 1940        BSA:          2.294 m Patient Age:    13 years        BP:           127/70 mmHg Patient Gender: F               HR:           83 bpm. Exam Location:  Inpatient Procedure: 2D Echo, Cardiac Doppler and Color Doppler Indications:    CHF  History:        Patient has no prior history of Echocardiogram examinations.                 Risk Factors:Hypertension and Sleep Apnea.  Sonographer:    Jyl Heinz Referring Phys: [id number] Rhetta Mura  Sonographer Comments: Patient is morbidly obese. Image acquisition challenging due to patient body habitus. IMPRESSIONS  1. Left ventricular ejection fraction, by estimation, is 55 to 60%. The left ventricle has normal function. The left ventricle has no regional wall motion abnormalities. There is mild concentric left ventricular hypertrophy. Left ventricular diastolic parameters are consistent with Grade I diastolic dysfunction (impaired relaxation).  2. Right ventricular systolic function is normal. The right ventricular size is normal. Tricuspid regurgitation signal is inadequate for assessing PA pressure.  3. The mitral valve is normal in structure. No evidence of mitral valve regurgitation.  4. The aortic valve is tricuspid. Aortic valve regurgitation is trivial. No aortic stenosis is present.  5. Aortic dilatation noted. There is mild dilatation of the aortic root, measuring 39 mm.  6. The inferior vena cava is dilated in size with >50% respiratory variability, suggesting right atrial pressure of 8 mmHg. FINDINGS  Left Ventricle: Left ventricular ejection fraction, by estimation, is 55 to 60%. The left ventricle has normal function. The left ventricle has no regional wall motion abnormalities. The left ventricular internal cavity size was normal in size.  There is  mild concentric left ventricular hypertrophy. Left ventricular diastolic parameters are consistent with Grade I diastolic dysfunction (impaired relaxation). Indeterminate filling pressures. Right Ventricle: The right ventricular size is normal. Right vetricular wall thickness was not well visualized. Right ventricular systolic function is normal. Tricuspid regurgitation signal is inadequate for assessing PA pressure. Left Atrium: Left atrial size was normal in size. Right Atrium: Right atrial size was normal in size.  Pericardium: There is no evidence of pericardial effusion. Mitral Valve: The mitral valve is normal in structure. No evidence of mitral valve regurgitation. Tricuspid Valve: The tricuspid valve is normal in structure. Tricuspid valve regurgitation is not demonstrated. Aortic Valve: The aortic valve is tricuspid. Aortic valve regurgitation is trivial. Aortic regurgitation PHT measures 464 msec. No aortic stenosis is present. Aortic valve mean gradient measures 4.0 mmHg. Aortic valve peak gradient measures 7.3 mmHg. Aortic valve area, by VTI measures 2.36 cm. Pulmonic Valve: The pulmonic valve was grossly normal. Pulmonic valve regurgitation is not visualized. Aorta: Aortic dilatation noted. There is mild dilatation of the aortic root, measuring 39 mm. Venous: The inferior vena cava is dilated in size with greater than 50% respiratory variability, suggesting right atrial pressure of 8 mmHg. IAS/Shunts: No atrial level shunt detected by color flow Doppler.  LEFT VENTRICLE PLAX 2D LVIDd:         4.70 cm      Diastology LVIDs:         3.00 cm      LV e' medial:    4.73 cm/s LV PW:         1.30 cm      LV E/e' medial:  10.1 LV IVS:        1.30 cm      LV e' lateral:   4.13 cm/s LVOT diam:     2.30 cm      LV E/e' lateral: 11.5 LV SV:         54 LV SV Index:   23 LVOT Area:     4.15 cm  LV Volumes (MOD) LV vol d, MOD A2C: 114.0 ml LV vol d, MOD A4C: 125.0 ml LV vol s, MOD A2C: 50.6 ml LV vol s, MOD  A4C: 52.4 ml LV SV MOD A2C:     63.4 ml LV SV MOD A4C:     125.0 ml LV SV MOD BP:      68.8 ml RIGHT VENTRICLE             IVC RV Basal diam:  3.10 cm     IVC diam: 2.30 cm RV Mid diam:    3.70 cm RV S prime:     12.90 cm/s TAPSE (M-mode): 2.2 cm LEFT ATRIUM             Index        RIGHT ATRIUM           Index LA diam:        3.20 cm 1.39 cm/m   RA Area:     12.80 cm LA Vol (A2C):   37.5 ml 16.34 ml/m  RA Volume:   24.40 ml  10.63 ml/m LA Vol (A4C):   40.4 ml 17.61 ml/m LA Biplane Vol: 40.4 ml 17.61 ml/m  AORTIC VALVE AV Area (Vmax):    2.29 cm AV Area (Vmean):   2.22 cm AV Area (VTI):     2.36 cm AV Vmax:           135.24 cm/s AV Vmean:          93.371 cm/s AV VTI:            0.227 m AV Peak Grad:      7.3 mmHg AV Mean Grad:      4.0 mmHg LVOT Vmax:         74.55 cm/s LVOT Vmean:        49.818 cm/s LVOT VTI:  0.129 m LVOT/AV VTI ratio: 0.57 AI PHT:            464 msec  AORTA Ao Root diam: 3.90 cm Ao Asc diam:  3.60 cm MITRAL VALVE MV Area (PHT): 6.27 cm    SHUNTS MV Decel Time: 121 msec    Systemic VTI:  0.13 m MV E velocity: 47.60 cm/s  Systemic Diam: 2.30 cm MV A velocity: 69.80 cm/s MV E/A ratio:  0.68 Mihai Croitoru MD Electronically signed by Sanda Klein MD Signature Date/Time: 04/06/2022/2:33:36 PM    Final    VAS [place] LOWER EXTREMITY VENOUS (DVT)  Result Date: 04/07/2022  Lower Venous DVT Study Patient Name:  NORITA MEIGS  Date of Exam:   04/07/2022 Medical Rec #: [id number]        Accession #:    [id number] Date of Birth: 1941/07/21         Patient Gender: F Patient Age:   85 years Exam Location:  [facility] Procedure:      VAS [place] LOWER EXTREMITY VENOUS (DVT) Referring Phys: ANAND HONGALGI --------------------------------------------------------------------------------  Indications: Pain.  Risk Factors: None identified. Limitations: Body habitus, poor ultrasound/tissue interface and patient movement, patient pain tolerance. Comparison Study: No prior studies. Performing  Technologist: Oliver Hum RVT  Examination Guidelines: A complete evaluation includes B-mode imaging, spectral Doppler, color Doppler, and power Doppler as needed of all accessible portions of each vessel. Bilateral testing is considered an integral part of a complete examination. Limited examinations for reoccurring indications may be performed as noted. The reflux portion of the exam is performed with the patient in reverse Trendelenburg.  +---------+---------------+---------+-----------+----------+-------------------+ RIGHT    CompressibilityPhasicitySpontaneityPropertiesThrombus Aging      +---------+---------------+---------+-----------+----------+-------------------+ CFV      Full           Yes      Yes                                      +---------+---------------+---------+-----------+----------+-------------------+ SFJ      Full                                                             +---------+---------------+---------+-----------+----------+-------------------+ FV Prox  Full                                                             +---------+---------------+---------+-----------+----------+-------------------+ FV Mid                  Yes      Yes                                      +---------+---------------+---------+-----------+----------+-------------------+ FV Distal               Yes      Yes                                      +---------+---------------+---------+-----------+----------+-------------------+  PFV      Full                                                             +---------+---------------+---------+-----------+----------+-------------------+ POP      Full           Yes      Yes                                      +---------+---------------+---------+-----------+----------+-------------------+ PTV      Full                                                              +---------+---------------+---------+-----------+----------+-------------------+ PERO                                                  Not well visualized +---------+---------------+---------+-----------+----------+-------------------+   +---------+---------------+---------+-----------+----------+-------------------+ LEFT     CompressibilityPhasicitySpontaneityPropertiesThrombus Aging      +---------+---------------+---------+-----------+----------+-------------------+ CFV      Full           Yes      Yes                                      +---------+---------------+---------+-----------+----------+-------------------+ SFJ      Full                                                             +---------+---------------+---------+-----------+----------+-------------------+ FV Prox  Full                                                             +---------+---------------+---------+-----------+----------+-------------------+ FV Mid                  Yes      Yes                                      +---------+---------------+---------+-----------+----------+-------------------+ FV Distal               Yes      Yes                                      +---------+---------------+---------+-----------+----------+-------------------+ PFV      Full                                                             +---------+---------------+---------+-----------+----------+-------------------+  POP      Full           Yes      Yes                                      +---------+---------------+---------+-----------+----------+-------------------+ PTV      Full                                                             +---------+---------------+---------+-----------+----------+-------------------+ PERO                                                  Not well visualized +---------+---------------+---------+-----------+----------+-------------------+      Summary: RIGHT: - There is no evidence of deep vein thrombosis in the lower extremity. However, portions of this examination were limited- see technologist comments above.  - No cystic structure found in the popliteal fossa.  LEFT: - There is no evidence of deep vein thrombosis in the lower extremity. However, portions of this examination were limited- see technologist comments above.  - No cystic structure found in the popliteal fossa.  *See table(s) above for measurements and observations. Electronically signed by Orlie Pollen on 04/07/2022 at 7:03:24 PM.    Final     Microbiology: Results for orders placed or performed during the hospital encounter of 06/23/20  Urine culture     Status: None   Collection Time: 06/23/20  5:05 PM   Specimen: Urine, Catheterized  Result Value Ref Range Status   Specimen Description URINE, CATHETERIZED  Final   Special Requests NONE  Final   Culture   Final    NO GROWTH Performed at [facility], [address] [place], [place] [address]    Report Status 06/24/2020 FINAL  Final  SARS Coronavirus 2 by RT PCR (hospital order, performed in [facility] hospital lab) Nasopharyngeal Nasopharyngeal Swab     Status: Abnormal   Collection Time: 06/23/20  5:07 PM   Specimen: Nasopharyngeal Swab  Result Value Ref Range Status   SARS Coronavirus 2 POSITIVE (A) NEGATIVE Final    Comment: RESULT CALLED TO, READ BACK BY AND VERIFIED WITH: RN Lelon Huh [phone] FCP (NOTE) SARS-CoV-2 target nucleic acids are DETECTED  SARS-CoV-2 RNA is generally detectable in upper respiratory specimens  during the acute phase of infection.  Positive results are indicative  of the presence of the identified virus, but do not rule out bacterial infection or co-infection with other pathogens not detected by the test.  Clinical correlation with patient history and  other diagnostic information is necessary to determine patient infection status.  The expected result is  negative.  Fact Sheet for Patients:   StrictlyIdeas.no   Fact Sheet for Healthcare Providers:   BankingDealers.co.za    This test is not yet approved or cleared by the [place] FDA and  has been authorized for detection and/or diagnosis of SARS-CoV-2 by FDA under an Emergency Use Authorization (EUA).  This EUA will remain in effect (meaning this test can b e used) for the duration of  the COVID-19  declaration under Section 564(b)(1) of the Act, 21 U.S.C. section 360-bbb-3(b)(1), unless the authorization is terminated or revoked sooner.  Performed at [facility], [place] [address], [place], [place] [address]   Blood culture (routine x 2)     Status: None   Collection Time: 06/23/20  5:18 PM   Specimen: BLOOD  Result Value Ref Range Status   Specimen Description BLOOD RIGHT ANTECUBITAL  Final   Special Requests   Final    BOTTLES DRAWN AEROBIC AND ANAEROBIC Blood Culture adequate volume   Culture   Final    NO GROWTH 5 DAYS Performed at [facility], [place] [address] [place], [place] [address]    Report Status 06/28/2020 FINAL  Final  Blood culture (routine x 2)     Status: None   Collection Time: 06/23/20  5:19 PM   Specimen: BLOOD  Result Value Ref Range Status   Specimen Description BLOOD SITE NOT SPECIFIED  Final   Special Requests   Final    BOTTLES DRAWN AEROBIC AND ANAEROBIC Blood Culture adequate volume   Culture   Final    NO GROWTH 5 DAYS Performed at [facility], [place] [address], [place],  [address]    Report Status 06/28/2020 FINAL  Final    Labs: CBC: Recent Labs  Lab 04/05/22 1923 04/06/22 0505  WBC 6.2 5.8  NEUTROABS 3.1 2.9  HGB 14.6 14.0  HCT 45.1 43.4  MCV 97.2 97.5  PLT 205 532   Basic Metabolic Panel: Recent Labs  Lab 04/05/22 1923 04/06/22 0505 04/07/22 0441 04/09/22 0430  NA 140 142 143 140  K 4.1 3.6 3.7 3.6  CL 108 107 106 104  CO2 '26 29 30 27  '$ GLUCOSE  102* 103* 108* 118*  BUN 5* <5* 6* 11  CREATININE 0.68 0.74 0.77 0.72  CALCIUM 8.9 8.6* 8.7* 8.7*  MG 2.4 2.3  --   --    Liver Function Tests: Recent Labs  Lab 04/05/22 1923 04/06/22 0505  AST 22 21  ALT 9 10  ALKPHOS 81 69  BILITOT 0.6 1.0  PROT 6.9 6.4*  ALBUMIN 3.4* 3.1*   CBG: No results for input(s): GLUCAP in the last 168 hours.  Discharge time spent: greater than 30 minutes.  Signed: Oswald Hillock, MD [facility] 04/09/2022

## 2022-04-09 NOTE — Care Management (Signed)
CHF or Chronic Pulmonary Condition  Patient suffers from CHF and has trouble breathing at night when head is elevated less than _30_ degrees.  Bed wedges do not provide enough elevation to resolve breathing issues.  Difficult breathing and Edema cause patient to require frequent changes in body position which cannot be achieved with a normal bed. Bariatric bed due to body habitus.

## 2022-04-09 NOTE — Plan of Care (Signed)
  Problem: Clinical Measurements: Goal: Will remain free from infection Outcome: Progressing   Problem: Nutrition: Goal: Adequate nutrition will be maintained Outcome: Progressing   Problem: Pain Managment: Goal: General experience of comfort will improve Outcome: Progressing   Problem: Safety: Goal: Ability to remain free from injury will improve Outcome: Progressing   

## 2022-04-10 NOTE — Care Management Important Message (Signed)
Important Message  Patient Details IM Letter given to the Patient. Name: Carol Martin MRN: [id number] Date of Birth: 08/09/1941   Medicare Important Message Given:  Yes     Kerin Salen 04/10/2022, 1:05 PM

## 2022-04-10 NOTE — TOC Transition Note (Addendum)
Transition of Care [facility]) - CM/SW Discharge Note   Patient Details  Name: Carol Martin MRN: [id number] Date of Birth: October 09, 1941  Transition of Care [facility]) CM/SW Contact:  Dessa Phi, RN Phone Number: 04/10/2022, 9:38 AM   Clinical Narrative:  Awaiting DME from [employer] to be delivered to home prior d/c. PTAR for transport once confirmed.  -11:56a-DME in the home-PTAR called.    Final next level of care: [place] Barriers to Discharge: No Barriers Identified   Patient Goals and CMS Choice Patient states their goals for this hospitalization and ongoing recovery are:: To return back home with home health. CMS Medicare.gov Compare Post Acute Care list provided to:: Patient Represenative (must comment) Choice offered to / list presented to : Adult Children  Discharge Placement                Patient to be transferred to facility by:  Corey Harold) Name of family member notified:  (Monica dtr) Patient and family notified of of transfer: 04/09/22  Discharge Plan and Services In-house Referral: Clinical Social Work Discharge Planning Services: CM Consult Post Acute Care Choice: Durable Medical Equipment, Home Health          DME Arranged: Wheelchair manual, 3-N-1, Other see comment Harrel Lemon lift) DME Agency: [employer] Date DME Agency Contacted: 04/08/22 Time DME Agency Contacted: [phone] Representative spoke with at DME Agency: [place]: RN, PT, OT, Nurse's Aide, Social Work [employer] Agency: [place] Date [place]: 04/07/22 Time [place]: [place] Representative spoke with at [place]: [place] ([place]) Interventions     Readmission Risk Interventions     No data to display

## 2022-04-10 NOTE — Progress Notes (Signed)
Patient was discharged yesterday, discharge held as bariatric bed was not delivered yesterday , hopefully bed will be delivered today.

## 2022-04-10 NOTE — Plan of Care (Signed)
  Problem: Cardiac: Goal: Ability to achieve and maintain adequate cardiopulmonary perfusion will improve Outcome: Adequate for Discharge   Problem: Clinical Measurements: Goal: Respiratory complications will improve Outcome: Adequate for Discharge   Problem: Nutrition: Goal: Adequate nutrition will be maintained Outcome: Adequate for Discharge   Problem: Skin Integrity: Goal: Risk for impaired skin integrity will decrease Outcome: Adequate for Discharge   Problem: Pain Managment: Goal: General experience of comfort will improve Outcome: Adequate for Discharge

## 2023-02-13 ENCOUNTER — Other Ambulatory Visit: Payer: Self-pay | Admitting: Internal Medicine

## 2023-02-13 DIAGNOSIS — Z1231 Encounter for screening mammogram for malignant neoplasm of breast: Secondary | ICD-10-CM

## 2023-03-27 ENCOUNTER — Ambulatory Visit: Payer: 59

## 2023-04-22 ENCOUNTER — Ambulatory Visit: Payer: 59

## 2023-05-21 ENCOUNTER — Ambulatory Visit
Admission: RE | Admit: 2023-05-21 | Discharge: 2023-05-21 | Disposition: A | Discharge status: Home / Self Care | Payer: 59 | Source: Ambulatory Visit | Attending: Internal Medicine | Admitting: Internal Medicine

## 2023-05-21 DIAGNOSIS — Z1231 Encounter for screening mammogram for malignant neoplasm of breast: Secondary | ICD-10-CM

## 2023-06-15 ENCOUNTER — Emergency Department (HOSPITAL_BASED_OUTPATIENT_CLINIC_OR_DEPARTMENT_OTHER): Payer: 59

## 2023-06-15 ENCOUNTER — Emergency Department (HOSPITAL_COMMUNITY)
Admission: EM | Admit: 2023-06-15 | Discharge: 2023-06-16 | Disposition: A | Discharge status: Home / Self Care | Payer: 59 | Source: Home / Self Care | Attending: Emergency Medicine | Admitting: Emergency Medicine

## 2023-06-15 ENCOUNTER — Emergency Department (HOSPITAL_COMMUNITY): Payer: 59

## 2023-06-15 ENCOUNTER — Encounter (HOSPITAL_COMMUNITY): Payer: Self-pay

## 2023-06-15 DIAGNOSIS — I110 Hypertensive heart disease with heart failure: Secondary | ICD-10-CM | POA: Diagnosis present

## 2023-06-15 DIAGNOSIS — R7309 Other abnormal glucose: Secondary | ICD-10-CM | POA: Diagnosis not present

## 2023-06-15 DIAGNOSIS — R059 Cough, unspecified: Secondary | ICD-10-CM | POA: Diagnosis not present

## 2023-06-15 DIAGNOSIS — Z79899 Other long term (current) drug therapy: Secondary | ICD-10-CM | POA: Diagnosis not present

## 2023-06-15 DIAGNOSIS — M79661 Pain in right lower leg: Secondary | ICD-10-CM | POA: Diagnosis not present

## 2023-06-15 DIAGNOSIS — I5033 Acute on chronic diastolic (congestive) heart failure: Secondary | ICD-10-CM | POA: Diagnosis not present

## 2023-06-15 DIAGNOSIS — R600 Localized edema: Secondary | ICD-10-CM

## 2023-06-15 NOTE — Progress Notes (Signed)
RLE venous duplex has been completed.  Preliminary results given to Britni Henderly, PA-C.    Results can be found under chart review under CV PROC. 06/15/2023 7:38 PM  RVT, RDMS

## 2023-06-15 NOTE — ED Provider Triage Note (Signed)
Emergency Medicine Provider Triage Evaluation Note  Carol Martin , a 82 y.o. female  was evaluated in triage.  Pt complains of BIL swelling, Right greater than left. Some cough, sob. On torsemide. Hx of DVT  Review of Systems  Positive: Le swelling, cough Negative:   Physical Exam  BP (!) 154/81   Pulse 84   Temp 98.9 F (37.2 C)   Resp (!) 25   Ht 5\' 1"  (1.549 m)   Wt 127 kg   SpO2 93%   BMI 52.91 kg/m  Gen:   Awake, no distress   Resp:  Normal effort  MSK:   Moves extremities without difficulty, le swelling Other:    Medical Decision Making  Medically screening exam initiated at 5:43 PM.  Appropriate orders placed.  Carol Martin was informed that the remainder of the evaluation will be completed by another provider, this initial triage assessment does not replace that evaluation, and the importance of remaining in the ED until their evaluation is complete.  Cough, le swelling   ,  A, PA-C 06/15/23 1744

## 2023-06-15 NOTE — ED Triage Notes (Signed)
Pt arrived with daughter with BLE lower extremity pain and swelling for a few days. Patient suppose to be taking dieretics and has not been taking. Also reports cough and congestion for weeks. Denies cp,shob, dizziness or any other symptoms.

## 2023-06-16 DIAGNOSIS — I5033 Acute on chronic diastolic (congestive) heart failure: Secondary | ICD-10-CM | POA: Diagnosis not present

## 2023-06-16 LAB — URINALYSIS, W/ REFLEX TO CULTURE (INFECTION SUSPECTED)
Bilirubin Urine: NEGATIVE
Glucose, UA: NEGATIVE mg/dL
Ketones, ur: NEGATIVE mg/dL
Nitrite: NEGATIVE
Protein, ur: NEGATIVE mg/dL
Specific Gravity, Urine: 1.019 (ref 1.005–1.030)
pH: 6 (ref 5.0–8.0)

## 2023-06-16 MED ORDER — OXYCODONE-ACETAMINOPHEN 5-325 MG PO TABS
1.0000 | ORAL_TABLET | Freq: Once | ORAL | Status: DC
  Filled 2023-06-16: qty 1

## 2023-06-16 MED ORDER — ACETAMINOPHEN 325 MG PO TABS
650.0000 mg | ORAL_TABLET | Freq: Once | ORAL | Status: AC
  Administered 2023-06-16: 650 mg via ORAL
  Filled 2023-06-16: qty 2

## 2023-06-16 NOTE — ED Provider Notes (Signed)
[place] EMERGENCY DEPARTMENT AT [facility] Provider Note   CSN: [id number] Arrival date & time: 06/15/23  1658     History  Chief Complaint  Patient presents with   Leg Swelling    3/4 days   Cough    3 weeks, productive     Carol Martin is a 82 y.o. female.  The history is provided by the patient and a relative.  Cough She has history of hypertension, diastolic heart failure and comes in because of increasing swelling of her legs and pain in both legs-right greater than left.  This has been going on for several weeks.  She is supposed to take a diuretic every day, but she only takes it about 3 days a week because of problems she has getting to the bathroom in time when she takes a diuretic.  Family has tried to keep her on a low-salt diet.  She denies chest pain, evidence, tightness, pressure.  She denies dyspnea.  She did have a subjective fever 2 days ago, none since then.  There has been minimal cough which is nonproductive.   Home Medications Prior to Admission medications   Medication Sig Start Date End Date Taking? Authorizing Provider  aspirin-acetaminophen-caffeine (EXCEDRIN MIGRAINE) [phone] MG tablet Take 1 tablet by mouth every 6 (six) hours as needed for headache.    [provider]  furosemide (LASIX) 40 MG tablet Take 1 tablet (40 mg total) by mouth daily. 04/09/22 08/07/22  Meredeth Ide, MD  lisinopril (ZESTRIL) 20 MG tablet Take 1 tablet (20 mg total) by mouth daily. 04/10/22   Meredeth Ide, MD  loratadine (CLARITIN) 10 MG tablet Take 10 mg by mouth daily. 04/16/20   [provider]  nystatin (MYCOSTATIN/NYSTOP) powder Apply 1 application topically daily as needed (itching/rash).    [provider]  polyvinyl alcohol (LIQUIFILM TEARS) 1.4 % ophthalmic solution Place 1 drop into both eyes daily as needed for dry eyes.    [provider]  potassium chloride (KLOR-CON M) 10 MEQ tablet Take 1 tablet (10 mEq total) by  mouth daily. 04/09/22   Meredeth Ide, MD  triamcinolone (KENALOG) 0.025 % cream Apply 1 application topically daily as needed (rash/itching).  03/08/18   [provider]  Vitamin D, Ergocalciferol, (DRISDOL) 1.25 MG (50000 UNIT) CAPS capsule Take 50,000 Units by mouth every Sunday. 05/09/20   [provider]      Allergies    Patient has no known allergies.    Review of Systems   Review of Systems  Respiratory:  Positive for cough.   All other systems reviewed and are negative.   Physical Exam Updated Vital Signs BP 139/72 (BP Location: Right Arm)   Pulse 82   Temp 98.3 F (36.8 C) (Oral)   Resp 20   Ht 5\' 1"  (1.549 m)   Wt 127 kg   SpO2 100%   BMI 52.91 kg/m  Physical Exam Vitals and nursing note reviewed.   82 year old female, resting comfortably and in no acute distress. Vital signs are normal. Oxygen saturation is 99%, which is normal. Head is normocephalic and atraumatic. PERRLA, EOMI. Oropharynx is clear. Neck is nontender and supple without adenopathy or JVD. Back is nontender and there is no CVA tenderness. Lungs are clear without rales, wheezes, or rhonchi. Chest is nontender. Heart has regular rate and rhythm without murmur. Abdomen is soft, flat, nontender. Extremities have 3+ edema, full range of motion is present. Skin is  warm and dry without rash. Neurologic: Mental status is normal, cranial nerves are intact, moves all extremities equally.  ED Results / Procedures / Treatments   Labs (all labs ordered are listed, but only abnormal results are displayed) Labs Reviewed  CBC WITH DIFFERENTIAL/PLATELET  BASIC METABOLIC PANEL  BRAIN NATRIURETIC PEPTIDE  URINALYSIS, W/ REFLEX TO CULTURE (INFECTION SUSPECTED)    EKG EKG Interpretation Date/Time:  Monday June 15 2023 17:27:52 EDT Ventricular Rate:  84 PR Interval:  209 QRS Duration:  96 QT Interval:  412 QTC Calculation: 487 R Axis:   -20  Text Interpretation: Sinus rhythm Consider  left atrial enlargement Abnormal R-wave progression, early transition Left ventricular hypertrophy Anterior Q waves, possibly due to LVH When compared with ECG of 04/05/2022, No significant change was found Confirmed by Dione Booze ([id number]) on 06/16/2023 2:05:10 AM  Radiology VAS [place] LOWER EXTREMITY VENOUS (DVT) (ONLY [facility] & [facility])  Result Date: 06/15/2023  Lower Venous DVT Study Patient Name:  LACHINA BRUN  Date of Exam:   06/15/2023 Medical Rec #: [id number]        Accession #:    [id number] Date of Birth: 02/27/1941         Patient Gender: F Patient Age:   69 years Exam Location:  [facility] Procedure:      VAS [place] LOWER EXTREMITY VENOUS (DVT) Referring Phys: BRITNI HENDERLY --------------------------------------------------------------------------------  Indications: Pain, and Erythema.  Limitations: Body habitus and poor ultrasound/tissue interface. Comparison Study: Previous exam on 04/07/22 was negative for DVT Performing Technologist: Ernestene Mention RVT, RDMS  Examination Guidelines: A complete evaluation includes B-mode imaging, spectral Doppler, color Doppler, and power Doppler as needed of all accessible portions of each vessel. Bilateral testing is considered an integral part of a complete examination. Limited examinations for reoccurring indications may be performed as noted. The reflux portion of the exam is performed with the patient in reverse Trendelenburg.  +---------+---------------+---------+-----------+----------+-------------------+ RIGHT    CompressibilityPhasicitySpontaneityPropertiesThrombus Aging      +---------+---------------+---------+-----------+----------+-------------------+ CFV      Full           Yes      Yes                                      +---------+---------------+---------+-----------+----------+-------------------+ SFJ      Full                                                              +---------+---------------+---------+-----------+----------+-------------------+ FV Prox  Full           Yes      Yes                                      +---------+---------------+---------+-----------+----------+-------------------+ FV Mid   Full           Yes      Yes                                      +---------+---------------+---------+-----------+----------+-------------------+ FV DistalFull  Yes      Yes                                      +---------+---------------+---------+-----------+----------+-------------------+ PFV      Full                                                             +---------+---------------+---------+-----------+----------+-------------------+ POP      Full           Yes      Yes                  Not well visualized +---------+---------------+---------+-----------+----------+-------------------+ PTV                                                   Not well visualized +---------+---------------+---------+-----------+----------+-------------------+ PERO     Full                                                             +---------+---------------+---------+-----------+----------+-------------------+   +----+---------------+---------+-----------+----------+--------------+ LEFTCompressibilityPhasicitySpontaneityPropertiesThrombus Aging +----+---------------+---------+-----------+----------+--------------+ CFV Full           Yes      Yes                                 +----+---------------+---------+-----------+----------+--------------+    Summary: RIGHT: - There is no evidence of deep vein thrombosis in the lower extremity in areas visualized. - No cystic structure found in the popliteal fossa.  LEFT: - No evidence of common femoral vein obstruction.  *See table(s) above for measurements and observations.    Preliminary    DG Chest 2 View  Result Date: 06/15/2023 CLINICAL DATA:  Shortness of breath.   Lower extremity swelling EXAM: CHEST - 2 VIEW COMPARISON:  X-ray 04/05/2022 FINDINGS: Underinflation. Enlarged cardiopericardial silhouette. Dilated ectatic aorta. No pneumothorax or effusion. Left basilar atelectasis or scar. No consolidation. Films are under penetrated. Degenerative changes seen of the spine on lateral view. IMPRESSION: Enlarged heart with some left basilar atelectasis or scar. Underinflation. Under penetrated radiograph Electronically Signed   By: Karen Kays M.D.   On: 06/15/2023 18:17    Procedures Procedures    Medications Ordered in ED Medications - No data to display  ED Course/ Medical Decision Making/ A&P                                 Medical Decision Making Risk OTC drugs.   Worsening peripheral edema which appears to be exacerbation of known diastolic heart failure with some medication noncompliance.  I have low index of suspicion for DVT.  Chest x-ray shows cardiomegaly.  I have independently viewed the images, and agree with radiologist interpretation.  Venous Doppler studies showed no evidence of DVT.  I  have reviewed her laboratory tests and my interpretation is normal CBC.  Basic metabolic panel and BNP are pending.  Because of recent fever, I am adding a urinalysis to make sure she does not have an occult UTI.  I have reviewed her past records, and she was admitted to the hospital from 04/05/2022-04/09/2022 for acute on chronic diastolic heart failure.  Echocardiogram done on 04/06/2022 showed left ventricular ejection fraction 55-60% with grade 1 diastolic dysfunction, mild concentric left ventricular hypertrophy.  I have reviewed and interpreted her laboratory test, and my interpretation is no evidence of urinary tract infection, mildly elevated random glucose, normal BNP.  I am discharging the patient with instructions to take her furosemide every day and I am referring her to cardiology for follow-up.  Final Clinical Impression(s) / ED Diagnoses Final  diagnoses:  Bilateral lower extremity edema  Acute on chronic diastolic heart failure (HCC)  Elevated random blood glucose level    Rx / DC Orders ED Discharge Orders     None         Dione Booze, MD 06/16/23 0345

## 2023-06-16 NOTE — Discharge Instructions (Addendum)
Please take your furosemide every day.  Please stay on a low-salt diet.

## 2023-08-25 ENCOUNTER — Emergency Department (HOSPITAL_COMMUNITY)
Admission: EM | Admit: 2023-08-25 | Discharge: 2023-08-25 | Disposition: A | Discharge status: Home / Self Care | Payer: 59 | Attending: Emergency Medicine | Admitting: Emergency Medicine

## 2023-08-25 ENCOUNTER — Encounter (HOSPITAL_COMMUNITY): Payer: Self-pay | Admitting: *Deleted

## 2023-08-25 ENCOUNTER — Emergency Department (HOSPITAL_COMMUNITY): Payer: 59

## 2023-08-25 ENCOUNTER — Other Ambulatory Visit: Payer: Self-pay

## 2023-08-25 DIAGNOSIS — R413 Other amnesia: Secondary | ICD-10-CM | POA: Insufficient documentation

## 2023-08-25 DIAGNOSIS — F0390 Unspecified dementia without behavioral disturbance: Secondary | ICD-10-CM | POA: Diagnosis not present

## 2023-08-25 DIAGNOSIS — W19XXXA Unspecified fall, initial encounter: Secondary | ICD-10-CM | POA: Insufficient documentation

## 2023-08-25 DIAGNOSIS — R600 Localized edema: Secondary | ICD-10-CM | POA: Insufficient documentation

## 2023-08-25 LAB — CBC WITH DIFFERENTIAL/PLATELET
Abs Immature Granulocytes: 0.03 10*3/uL (ref 0.00–0.07)
Basophils Absolute: 0 10*3/uL (ref 0.0–0.1)
Basophils Relative: 1 %
Eosinophils Absolute: 0.1 10*3/uL (ref 0.0–0.5)
Eosinophils Relative: 3 %
HCT: 44.9 % (ref 36.0–46.0)
Hemoglobin: 14.5 g/dL (ref 12.0–15.0)
Immature Granulocytes: 1 %
Lymphocytes Relative: 38 %
Lymphs Abs: 1.8 10*3/uL (ref 0.7–4.0)
MCH: 30.6 pg (ref 26.0–34.0)
MCHC: 32.3 g/dL (ref 30.0–36.0)
MCV: 94.7 fL (ref 80.0–100.0)
Monocytes Absolute: 0.4 10*3/uL (ref 0.1–1.0)
Monocytes Relative: 8 %
Neutro Abs: 2.4 10*3/uL (ref 1.7–7.7)
Neutrophils Relative %: 49 %
Platelets: 180 10*3/uL (ref 150–400)
RBC: 4.74 MIL/uL (ref 3.87–5.11)
RDW: 13.3 % (ref 11.5–15.5)
WBC: 4.8 10*3/uL (ref 4.0–10.5)
nRBC: 0 % (ref 0.0–0.2)

## 2023-08-25 LAB — COMPREHENSIVE METABOLIC PANEL
ALT: 9 U/L (ref 0–44)
AST: 21 U/L (ref 15–41)
Albumin: 3 g/dL — ABNORMAL LOW (ref 3.5–5.0)
Alkaline Phosphatase: 71 U/L (ref 38–126)
Anion gap: 10 (ref 5–15)
BUN: 5 mg/dL — ABNORMAL LOW (ref 8–23)
CO2: 25 mmol/L (ref 22–32)
Calcium: 8.9 mg/dL (ref 8.9–10.3)
Chloride: 105 mmol/L (ref 98–111)
Creatinine, Ser: 0.65 mg/dL (ref 0.44–1.00)
GFR, Estimated: >60 mL/min (ref >60)
Glucose, Bld: 97 mg/dL (ref 70–99)
Potassium: 4 mmol/L (ref 3.5–5.1)
Sodium: 140 mmol/L (ref 135–145)
Total Bilirubin: 0.9 mg/dL (ref 0.3–1.2)
Total Protein: 6.5 g/dL (ref 6.5–8.1)

## 2023-08-25 LAB — I-STAT CG4 LACTIC ACID, ED: Lactic Acid, Venous: 2.5 mmol/L (ref 0.5–1.9)

## 2023-08-25 NOTE — ED Provider Notes (Signed)
[place] EMERGENCY DEPARTMENT AT [facility] Provider Note   CSN: [id number] Arrival date & time: 08/25/23  1601     History  Chief Complaint  Patient presents with   Carol Martin    Carol Martin is a 82 y.o. female.  HPI Patient with a history of dementia presents after possible fall.  Per EMS report the patient's daughter heard a thud and found the patient on the ground.  Patient known to have multiple medical problems, reportedly questionable medication compliance.  The patient herself states that" I feel good", denies pain, acknowledges arthritis.  She is unable to describe the event.  She is also unable to describe if she is taking her medications as directed.  Per EMS the patient was mildly hypertensive en route with CBG 122. Level 5 caveat secondary to dementia   Home Medications Prior to Admission medications   Medication Sig Start Date End Date Taking? Authorizing Provider  aspirin-acetaminophen-caffeine (EXCEDRIN MIGRAINE) [phone] MG tablet Take 1 tablet by mouth every 6 (six) hours as needed for headache.    [provider]  furosemide (LASIX) 40 MG tablet Take 1 tablet (40 mg total) by mouth daily. 04/09/22 08/07/22  Meredeth Ide, MD  lisinopril (ZESTRIL) 20 MG tablet Take 1 tablet (20 mg total) by mouth daily. 04/10/22   Meredeth Ide, MD  loratadine (CLARITIN) 10 MG tablet Take 10 mg by mouth daily. 04/16/20   [provider]  nystatin (MYCOSTATIN/NYSTOP) powder Apply 1 application topically daily as needed (itching/rash).    [provider]  polyvinyl alcohol (LIQUIFILM TEARS) 1.4 % ophthalmic solution Place 1 drop into both eyes daily as needed for dry eyes.    [provider]  potassium chloride (KLOR-CON M) 10 MEQ tablet Take 1 tablet (10 mEq total) by mouth daily. 04/09/22   Meredeth Ide, MD  triamcinolone (KENALOG) 0.025 % cream Apply 1 application topically daily as needed (rash/itching).  03/08/18   [provider]   Vitamin D, Ergocalciferol, (DRISDOL) 1.25 MG (50000 UNIT) CAPS capsule Take 50,000 Units by mouth every Sunday. 05/09/20   [provider]      Allergies    Patient has no known allergies.    Review of Systems   Review of Systems  Physical Exam Updated Vital Signs BP 118/68   Pulse 65   Temp 97.9 F (36.6 C)   Resp (!) 23   Wt 133.4 kg   SpO2 97%   BMI 55.57 kg/m  Physical Exam Vitals and nursing note reviewed.  Constitutional:      General: She is not in acute distress.    Appearance: She is well-developed. She is obese. She is not ill-appearing, toxic-appearing or diaphoretic.  HENT:     Head: Normocephalic and atraumatic.  Eyes:     Conjunctiva/sclera: Conjunctivae normal.  Cardiovascular:     Rate and Rhythm: Normal rate and regular rhythm.  Pulmonary:     Effort: Pulmonary effort is normal. No respiratory distress.     Breath sounds: Normal breath sounds. No stridor.  Abdominal:     General: There is no distension.  Musculoskeletal:     Right lower leg: Edema present.     Left lower leg: Edema present.  Skin:    General: Skin is warm and dry.  Neurological:     Mental Status: She is alert.     Cranial Nerves: No cranial nerve deficit.     Comments: Oriented to self.  Follows commands, moving  her extremities appropriately, face is symmetric, speech is brief but clear.  Psychiatric:        Behavior: Behavior is withdrawn.        Cognition and Memory: Cognition is impaired. Memory is impaired.     ED Results / Procedures / Treatments   Labs (all labs ordered are listed, but only abnormal results are displayed) Labs Reviewed  COMPREHENSIVE METABOLIC PANEL - Abnormal; Notable for the following components:      Result Value   BUN 5 (*)    Albumin 3.0 (*)    All other components within normal limits  I-STAT CG4 LACTIC ACID, ED - Abnormal; Notable for the following components:   Lactic Acid, Venous 2.5 (*)    All other components within normal  limits  CBC WITH DIFFERENTIAL/PLATELET  I-STAT CG4 LACTIC ACID, ED    EKG None  Radiology CT Cervical Spine Wo Contrast  Result Date: 08/25/2023 CLINICAL DATA:  82 year old female status post unwitnessed fall. Found down. EXAM: CT CERVICAL SPINE WITHOUT CONTRAST TECHNIQUE: Multidetector CT imaging of the cervical spine was performed without intravenous contrast. Multiplanar CT image reconstructions were also generated. RADIATION DOSE REDUCTION: This exam was performed according to the departmental dose-optimization program which includes automated exposure control, adjustment of the mA and/or kV according to patient size and/or use of iterative reconstruction technique. COMPARISON:  Head CT today. FINDINGS: Alignment: Straightening, mild reversal of cervical lordosis. Cervicothoracic junction alignment is within normal limits. Bilateral posterior element alignment is within normal limits. Skull base and vertebrae: Visualized skull base is intact. No atlanto-occipital dissociation. C1 and C2 appear intact and aligned. Mild motion artifact. No acute osseous abnormality identified. Soft tissues and spinal canal: No prevertebral fluid or swelling. No visible canal hematoma. Negative visible noncontrast neck soft tissues. Disc levels: Widespread advanced chronic disc and endplate degeneration. Up to mild associated degenerative cervical spinal stenosis. Upper chest: Grossly intact visible upper thoracic levels. Upper lungs are clear. IMPRESSION: 1. Mild motion. No acute traumatic injury identified in the cervical spine. 2. 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 Electronically Signed   By: Odessa Fleming M.D.   On: 08/25/2023 08:24   CT Head Wo Contrast  Result Date: 08/25/2023 CLINICAL DATA:  82 year old female status post unwitnessed fall. Found down. EXAM: CT HEAD WITHOUT CONTRAST TECHNIQUE: Contiguous axial images were obtained from the base of the  skull through the vertex without intravenous contrast. RADIATION DOSE REDUCTION: This exam was performed according to the departmental dose-optimization program which includes automated exposure control, adjustment of the mA and/or kV according to patient size and/or use of iterative reconstruction technique. COMPARISON:  Brain MRI 06/12/2014.  Head CT 06/23/2020. 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 Chronically expanded and low-density sella turcica appears stable since 2021, not significantly changed from the 2015 MRI. Elsewhere cerebral volume remains normal for age. No midline shift or acute intracranial mass effect. No ventriculomegaly. No acute intracranial hemorrhage identified. No cortically based acute infarct identified. Gray-white differentiation appears stable and within normal limits for age. Vascular: Chronic intracranial artery dolichoectasia, including at the basilar tip. Calcified atherosclerosis at the skull base. No suspicious intracranial vascular hyperdensity. Skull: Chronically expanded bony sella turcica, stable. No acute osseous abnormality identified. Sinuses/Orbits: Chronic bilateral middle ear and mastoid opacification does not appear significantly changed. Paranasal sinuses remain well aerated. Other: No orbit or scalp soft tissue injury identified. IMPRESSION: 1. No acute intracranial abnormality or acute traumatic injury identified. 2.  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 Electronically Signed   By: Odessa Fleming M.D.   On: 08/25/2023 08:15    Procedures Procedures    Medications Ordered in ED Medications - No data to display  ED Course/ Medical Decision Making/ A&P                                 Medical Decision Making Elderly female presents after possible fall.  Dementia somewhat prohibitive.  Patient noted on  triage to have a history of seizures, though this is not consistent with her chart.  Patient is awake, alert, following commands appropriately, but has obvious disorientation which is seemingly at baseline.  With unknown circumstances, broad differential including minor fall, head trauma, neck trauma, electrolyte abnormalities arrhythmia considered. Cardiac 70 sinus normal Pulse ox 99 percent room air normal   Amount and/or Complexity of Data Reviewed Independent Historian: EMS    Details: EMS twelve-lead with sinus rhythm, first-degree block, nonspecific ST wave changes External Data Reviewed: notes. Labs: ordered. Decision-making details documented in ED Course. Radiology: ordered and independent interpretation performed. Decision-making details documented in ED Course. ECG/medicine tests: ordered and independent interpretation performed. Decision-making details documented in ED Course.  Risk Prescription drug management. Decision regarding hospitalization. Diagnosis or treatment significantly limited by social determinants of health.   11:17 AM Patient accompanied by her daughter.  Daughter corroborates history as above, hearing a thud from the other room.  Daughter also notes the patient has a history of seizures, only occasionally takes her medications, as the patient has known dementia, and there is some balancing of quality of life and encouraging her to take her medications. Labs here consistent with possible seizure, though the patient also may have mild dehydration, she has no additional seizure activity here, no evidence for decompensation, imaging studies are unremarkable, no head, neck abnormalities.  Daughter comfortable with taking the patient home.  We discussed importance of taking medications as previously prescribed and patient was discharged in stable condition.        Final Clinical Impression(s) / ED Diagnoses Final diagnoses:  Fall, initial encounter      Gerhard Munch, MD 08/25/23 1118

## 2023-08-25 NOTE — ED Notes (Signed)
Pt to CT

## 2023-08-25 NOTE — Discharge Instructions (Signed)
As discussed, your evaluation today has been largely reassuring.  But, it is important that you monitor your condition carefully, and do not hesitate to return to the ED if you develop new, or concerning changes in your condition. ? ?Otherwise, please follow-up with your physician for appropriate ongoing care. ? ?

## 2023-08-25 NOTE — ED Triage Notes (Signed)
Pt lives with her daughter who heard a thud in bedroom. Once in room, pt recliner was up and she was on floor. Daughter got her into sitting position. Hx of seizure, has not been taking her keppra for 3 weeks. placed on 2 liters. Can walk with assistance. Alert to person and place only. No focal weakness. 150/90bp, cbg 122, hr 72 PVC's, 91% initial, 2 liters [place] added. No complaints of pain. LSN 10:30 last night.

## 2023-10-13 ENCOUNTER — Other Ambulatory Visit: Payer: Self-pay

## 2023-10-13 ENCOUNTER — Encounter (HOSPITAL_COMMUNITY): Payer: Self-pay

## 2023-10-13 ENCOUNTER — Emergency Department (HOSPITAL_COMMUNITY)
Admission: EM | Admit: 2023-10-13 | Discharge: 2023-10-13 | Disposition: A | Discharge status: Home / Self Care | Payer: 59 | Attending: Emergency Medicine | Admitting: Emergency Medicine

## 2023-10-13 DIAGNOSIS — R569 Unspecified convulsions: Secondary | ICD-10-CM | POA: Insufficient documentation

## 2023-10-13 DIAGNOSIS — Z853 Personal history of malignant neoplasm of breast: Secondary | ICD-10-CM | POA: Diagnosis not present

## 2023-10-13 LAB — COMPREHENSIVE METABOLIC PANEL
ALT: 7 U/L (ref 0–44)
AST: 26 U/L (ref 15–41)
Albumin: 3.3 g/dL — ABNORMAL LOW (ref 3.5–5.0)
Alkaline Phosphatase: 79 U/L (ref 38–126)
Anion gap: 8 (ref 5–15)
BUN: 9 mg/dL (ref 8–23)
CO2: 27 mmol/L (ref 22–32)
Calcium: 8.8 mg/dL — ABNORMAL LOW (ref 8.9–10.3)
Chloride: 102 mmol/L (ref 98–111)
Creatinine, Ser: 0.73 mg/dL (ref 0.44–1.00)
GFR, Estimated: >60 mL/min (ref >60)
Glucose, Bld: 105 mg/dL — ABNORMAL HIGH (ref 70–99)
Potassium: 4.9 mmol/L (ref 3.5–5.1)
Sodium: 137 mmol/L (ref 135–145)
Total Bilirubin: 1.5 mg/dL — ABNORMAL HIGH (ref <1.2)
Total Protein: 6.4 g/dL — ABNORMAL LOW (ref 6.5–8.1)

## 2023-10-13 LAB — CBC WITH DIFFERENTIAL/PLATELET
Abs Immature Granulocytes: 0.02 10*3/uL (ref 0.00–0.07)
Basophils Absolute: 0 10*3/uL (ref 0.0–0.1)
Basophils Relative: 1 %
Eosinophils Absolute: 0.1 10*3/uL (ref 0.0–0.5)
Eosinophils Relative: 2 %
HCT: 44.1 % (ref 36.0–46.0)
Hemoglobin: 14.6 g/dL (ref 12.0–15.0)
Immature Granulocytes: 0 %
Lymphocytes Relative: 30 %
Lymphs Abs: 1.4 10*3/uL (ref 0.7–4.0)
MCH: 31.5 pg (ref 26.0–34.0)
MCHC: 33.1 g/dL (ref 30.0–36.0)
MCV: 95 fL (ref 80.0–100.0)
Monocytes Absolute: 0.4 10*3/uL (ref 0.1–1.0)
Monocytes Relative: 9 %
Neutro Abs: 2.8 10*3/uL (ref 1.7–7.7)
Neutrophils Relative %: 58 %
Platelets: 174 10*3/uL (ref 150–400)
RBC: 4.64 MIL/uL (ref 3.87–5.11)
RDW: 13.2 % (ref 11.5–15.5)
WBC: 4.8 10*3/uL (ref 4.0–10.5)
nRBC: 0 % (ref 0.0–0.2)

## 2023-10-13 LAB — LACTIC ACID, PLASMA
Lactic Acid, Venous: 2.1 mmol/L (ref 0.5–1.9)
Lactic Acid, Venous: 1.5 mmol/L (ref 0.5–1.9)

## 2023-10-13 MED ORDER — SODIUM CHLORIDE 0.9 % IV BOLUS
500.0000 mL | Freq: Once | INTRAVENOUS | Status: AC
  Administered 2023-10-13: 500 mL via INTRAVENOUS

## 2023-10-13 MED ORDER — SODIUM CHLORIDE 0.9 % IV SOLN
2000.0000 mg | Freq: Once | INTRAVENOUS | Status: AC
  Administered 2023-10-13: 2000 mg via INTRAVENOUS
  Filled 2023-10-13: qty 20

## 2023-10-13 NOTE — ED Triage Notes (Signed)
Pt BIB EMS with reports of a seizure tonight. Pt is noncompliant with her keppra. Pt does not like the way it makes her feel. Tonight, pt had a longer period of postical time than her normal seizures. Last seizure was 2 weeks ago.

## 2023-10-13 NOTE — ED Provider Notes (Signed)
[place] EMERGENCY DEPARTMENT AT [facility] Provider Note   CSN: [id number] Arrival date & time: 10/13/23  0549     History  Chief Complaint  Patient presents with   Seizures    Carol Martin is a 82 y.o. female.  With a history of seizures, breast cancer and heart failure presents to the ED after a seizure.  Patient had a witnessed seizure at home this morning she was postictal for a brief period but has returned to neurologic baseline per daughter.  She reports last seizure was about a month ago but she does not take any antiepileptic drugs.  Denies headaches, chest pain, nausea, vomiting, shortness of breath and abdominal pain.  No falls or subsequent trauma.   Seizures      Home Medications Prior to Admission medications   Medication Sig Start Date End Date Taking? Authorizing Provider  aspirin-acetaminophen-caffeine (EXCEDRIN MIGRAINE) [phone] MG tablet Take 1 tablet by mouth every 6 (six) hours as needed for headache.    [provider]  furosemide (LASIX) 40 MG tablet Take 1 tablet (40 mg total) by mouth daily. 04/09/22 08/07/22  Meredeth Ide, MD  lisinopril (ZESTRIL) 20 MG tablet Take 1 tablet (20 mg total) by mouth daily. 04/10/22   Meredeth Ide, MD  loratadine (CLARITIN) 10 MG tablet Take 10 mg by mouth daily. 04/16/20   [provider]  nystatin (MYCOSTATIN/NYSTOP) powder Apply 1 application topically daily as needed (itching/rash).    [provider]  polyvinyl alcohol (LIQUIFILM TEARS) 1.4 % ophthalmic solution Place 1 drop into both eyes daily as needed for dry eyes.    [provider]  potassium chloride (KLOR-CON M) 10 MEQ tablet Take 1 tablet (10 mEq total) by mouth daily. 04/09/22   Meredeth Ide, MD  triamcinolone (KENALOG) 0.025 % cream Apply 1 application topically daily as needed (rash/itching).  03/08/18   [provider]  Vitamin D, Ergocalciferol, (DRISDOL) 1.25 MG (50000 UNIT) CAPS capsule Take 50,000  Units by mouth every Sunday. 05/09/20   [provider]      Allergies    Patient has no known allergies.    Review of Systems   Review of Systems  Neurological:  Positive for seizures.    Physical Exam Updated Vital Signs BP (!) 142/100   Pulse 73   Temp 97.7 F (36.5 C)   Resp 18   Ht 5\' 1"  (1.549 m)   Wt 133.4 kg   SpO2 95%   BMI 55.57 kg/m  Physical Exam Vitals and nursing note reviewed.  HENT:     Head: Normocephalic and atraumatic.  Eyes:     Pupils: Pupils are equal, round, and reactive to light.  Cardiovascular:     Rate and Rhythm: Normal rate and regular rhythm.  Pulmonary:     Effort: Pulmonary effort is normal.     Breath sounds: Normal breath sounds.  Abdominal:     Palpations: Abdomen is soft.     Tenderness: There is no abdominal tenderness.  Skin:    General: Skin is warm and dry.  Neurological:     Mental Status: She is alert.     Sensory: No sensory deficit.     Motor: No weakness.  Psychiatric:        Mood and Affect: Mood normal.     ED Results / Procedures / Treatments   Labs (all labs ordered are listed, but only abnormal results are displayed) Labs Reviewed  COMPREHENSIVE METABOLIC  PANEL - Abnormal; Notable for the following components:      Result Value   Glucose, Bld 105 (*)    Calcium 8.8 (*)    Total Protein 6.4 (*)    Albumin 3.3 (*)    Total Bilirubin 1.5 (*)    All other components within normal limits  LACTIC ACID, PLASMA - Abnormal; Notable for the following components:   Lactic Acid, Venous 2.1 (*)    All other components within normal limits  CBC WITH DIFFERENTIAL/PLATELET  LACTIC ACID, PLASMA    EKG None  Radiology No results found.  Procedures Procedures    Medications Ordered in ED Medications  sodium chloride 0.9 % bolus 500 mL (500 mLs Intravenous New Bag/Given 10/13/23 0846)  levETIRAcetam (KEPPRA) 2,000 mg in sodium chloride 0.9 % 250 mL IVPB (2,000 mg Intravenous New Bag/Given 10/13/23  0912)    ED Course/ Medical Decision Making/ A&P Clinical Course as of 10/13/23 1156  Tue Oct 13, 2023  4098 Daughter now at bedside.  Confirmed patient's history of seizures and noncompliance with Keppra.  Will provide IV Keppra load here and continue to monitor on seizure precautions [MP]  1007 Lactate of 2.1 consistent with seizure this morning.  Patient has received IV fluids and Keppra.  Will recheck lactate to ensure it is downtrending.  No leukocytosis that would be good concerning for acute infection.  Laboratory workup otherwise unremarkable overall.  Patient has remained stable without recurrent seizure activity here in the ED [MP]  1154 Lactate downtrending after IV fluids and Keppra.  No recurrent seizure activity.  Stable for discharge with instruction for neurology follow-up.  Encouraged patient to begin taking her Keppra as prescribed and talk with her neurologist about other options for antiepileptic drugs if she does not like the sedating effects of Keppra [MP]    Clinical Course User Index [MP] Royanne Foots, DO                                 Medical Decision Making 82 year old female with history as above presenting after seizure this morning.  She has a reported history of seizures but does not take any antiepileptic drugs for this reason.  Lives with her daughter.  Brief postictal period with no recurrence of seizure activity.  Benign neurologic exam she is back to baseline now.  Will speak to her daughter more about medication management as we were informed she is supposed to be on Keppra but does not take this medication.  If this is the case we will load with Keppra here and continue to monitor on seizure precautions.  Amount and/or Complexity of Data Reviewed Labs: ordered.           Final Clinical Impression(s) / ED Diagnoses Final diagnoses:  Seizure [facility])    Rx / DC Orders ED Discharge Orders     None         Royanne Foots, DO 10/13/23  1156

## 2023-10-13 NOTE — Discharge Instructions (Signed)
You were seen in the emergency department after having a seizure The most likely reason you had a seizures because you have not been taking the Keppra you are supposed to be taking We gave you a dose of IV Keppra here in the emergency department and you did not have another seizure It is important that you take your Keppra at home to prevent future seizures If the side effects of Keppra are preventing you from taking it, discuss another seizure medicine with your neurologist Return to the Emergency Department for recurrent seizures or any other concerns  Outpatient seizure precautions: Per [facility] statutes, patients with seizures are not allowed to drive until  they have been seizure-free for six months. Use caution when using heavy equipment or power tools. Avoid working on ladders or at heights. Take showers instead of baths. Ensure the water temperature is not too high on the home water heater. Do not go swimming alone. When caring for infants or small children, sit down when holding, feeding, or changing them to minimize risk of injury to the child in the event you have a seizure. Also, Maintain good sleep hygiene. Avoid alcohol.

## 2024-04-09 ENCOUNTER — Emergency Department (HOSPITAL_COMMUNITY)
Admission: EM | Admit: 2024-04-09 | Discharge: 2024-04-09 | Disposition: A | Discharge status: Home / Self Care | Attending: Emergency Medicine | Admitting: Emergency Medicine

## 2024-04-09 ENCOUNTER — Emergency Department (HOSPITAL_COMMUNITY)

## 2024-04-09 ENCOUNTER — Encounter (HOSPITAL_COMMUNITY): Payer: Self-pay | Admitting: Emergency Medicine

## 2024-04-09 ENCOUNTER — Other Ambulatory Visit: Payer: Self-pay

## 2024-04-09 DIAGNOSIS — I110 Hypertensive heart disease with heart failure: Secondary | ICD-10-CM | POA: Insufficient documentation

## 2024-04-09 DIAGNOSIS — Z853 Personal history of malignant neoplasm of breast: Secondary | ICD-10-CM | POA: Insufficient documentation

## 2024-04-09 DIAGNOSIS — Z8616 Personal history of COVID-19: Secondary | ICD-10-CM | POA: Diagnosis not present

## 2024-04-09 DIAGNOSIS — R569 Unspecified convulsions: Secondary | ICD-10-CM | POA: Insufficient documentation

## 2024-04-09 DIAGNOSIS — Z79899 Other long term (current) drug therapy: Secondary | ICD-10-CM | POA: Insufficient documentation

## 2024-04-09 DIAGNOSIS — G309 Alzheimer's disease, unspecified: Secondary | ICD-10-CM | POA: Insufficient documentation

## 2024-04-09 DIAGNOSIS — I5032 Chronic diastolic (congestive) heart failure: Secondary | ICD-10-CM | POA: Insufficient documentation

## 2024-04-09 DIAGNOSIS — Z7982 Long term (current) use of aspirin: Secondary | ICD-10-CM | POA: Diagnosis not present

## 2024-04-09 LAB — COMPREHENSIVE METABOLIC PANEL WITH GFR
ALT: 9 U/L (ref 0–44)
AST: 24 U/L (ref 15–41)
Albumin: 3.1 g/dL — ABNORMAL LOW (ref 3.5–5.0)
Alkaline Phosphatase: 74 U/L (ref 38–126)
Anion gap: 6 (ref 5–15)
BUN: 6 mg/dL — ABNORMAL LOW (ref 8–23)
CO2: 26 mmol/L (ref 22–32)
Calcium: 8.4 mg/dL — ABNORMAL LOW (ref 8.9–10.3)
Chloride: 107 mmol/L (ref 98–111)
Creatinine, Ser: 0.81 mg/dL (ref 0.44–1.00)
GFR, Estimated: >60 mL/min (ref >60)
Glucose, Bld: 106 mg/dL — ABNORMAL HIGH (ref 70–99)
Potassium: 3.8 mmol/L (ref 3.5–5.1)
Sodium: 139 mmol/L (ref 135–145)
Total Bilirubin: 0.9 mg/dL (ref 0.0–1.2)
Total Protein: 6.1 g/dL — ABNORMAL LOW (ref 6.5–8.1)

## 2024-04-09 LAB — CBC WITH DIFFERENTIAL/PLATELET
Abs Immature Granulocytes: 0.02 10*3/uL (ref 0.00–0.07)
Basophils Absolute: 0 10*3/uL (ref 0.0–0.1)
Basophils Relative: 1 %
Eosinophils Absolute: 0 10*3/uL (ref 0.0–0.5)
Eosinophils Relative: 1 %
HCT: 43.7 % (ref 36.0–46.0)
Hemoglobin: 14.3 g/dL (ref 12.0–15.0)
Immature Granulocytes: 1 %
Lymphocytes Relative: 19 %
Lymphs Abs: 0.8 10*3/uL (ref 0.7–4.0)
MCH: 31.6 pg (ref 26.0–34.0)
MCHC: 32.7 g/dL (ref 30.0–36.0)
MCV: 96.7 fL (ref 80.0–100.0)
Monocytes Absolute: 0.4 10*3/uL (ref 0.1–1.0)
Monocytes Relative: 8 %
Neutro Abs: 3.1 10*3/uL (ref 1.7–7.7)
Neutrophils Relative %: 70 %
Platelets: 177 10*3/uL (ref 150–400)
RBC: 4.52 MIL/uL (ref 3.87–5.11)
RDW: 13.1 % (ref 11.5–15.5)
WBC: 4.4 10*3/uL (ref 4.0–10.5)
nRBC: 0 % (ref 0.0–0.2)

## 2024-04-09 MED ORDER — LEVETIRACETAM (KEPPRA) 500 MG/5 ML ADULT IV PUSH
1500.0000 mg | Freq: Once | INTRAVENOUS | Status: AC
  Administered 2024-04-09: 1500 mg via INTRAVENOUS
  Filled 2024-04-09: qty 15

## 2024-04-09 NOTE — Discharge Instructions (Addendum)
 While you were in the emergency room, you had blood work done that was normal.  Your CT scan of your head was also normal.  Your chest x-ray was normal.  I believe that your seizure was caused by missing some Keppra  doses.  Please make sure that you are taking all medications as prescribed.  Please follow-up with your neurologist, and looking through her chart it appears you see Dr. Avanell Bob at [facility].  Give them a call within 1 week to discuss recurrent seizures.

## 2024-04-09 NOTE — ED Triage Notes (Signed)
 Pt bib EMS from home. Family called EMS due to finding pt sitting on floor by bed. Pt has a history of seizures and has stopped taking seizure medication for last week per family. EMS states pt presented as post ictal upon arrival not able to answer A&O questions. Within a few mins pt was able to answer A&O questions x3 per EMS. Pt given zofran  for continued burping and possible vomiting. Pt denies nausea. Pt also presented O2 of 88% placed on 4L. Family denies that pt hit head.   BP 134/80 HR 100 RR 18 96 4L Cbg 125 4 mg zofran   18LAC

## 2024-04-09 NOTE — ED Provider Notes (Addendum)
 [place] EMERGENCY DEPARTMENT AT [facility] Provider Note  CSN: [id number] Arrival date & time: 04/09/24 1350  Chief Complaint(s) Near Syncope and Seizures  HPI Carol Martin is a 83 y.o. female with a history of seizure disorder who is here today for possible seizure.  History is obtained via patient's son who is at bedside and daughter on the phone.  Daughter states that she had left the home for 1 hour, the patient had been well at that time, she returned home and the patient was sitting down on the ground.  She says that when she arrived, the patient seemed confused, like she had previously when she had had seizures.  Daughter then examined the patient's medication tray, noticed that there were several missed doses of Keppra .  When EMS arrived, they reported that the patient was postictal, improved during transport to ED.   Past Medical History Past Medical History:  Diagnosis Date   Abnormal EKG    Allergic rhinitis    Alzheimer's disease (HCC) 05/17/07   Arthritis of knee    Breast cancer (HCC)    Diastolic dysfunction    Dilated aortic root (HCC)    GERD (gastroesophageal reflux disease)    Hypertension    Kidney stones    Mild aortic insufficiency    Morbid obesity (HCC)    Sleep apnea    with insomnia   Venous (peripheral) insufficiency    Patient Active Problem List   Diagnosis Date Noted   Generalized weakness 04/06/2022   Allergic rhinitis 04/06/2022   Acute on chronic diastolic heart failure (HCC) 04/05/2022   COVID-19 06/23/2020   Chronic pain of both knees 02/11/2018   Bilateral hip pain 02/11/2018   Unilateral primary osteoarthritis, left knee 02/11/2018   Unilateral primary osteoarthritis, right knee 02/11/2018   Tinea corporis 04/29/2016   Cellulitis, abdominal wall 04/29/2016   Dilated aortic root (HCC)    Chronic diastolic CHF (congestive heart failure) (HCC)    Mild aortic insufficiency    Sleep apnea    Breast cancer (HCC)  02/08/2013   Abnormal EKG 05/18/2011   Dyspnea 05/18/2011   Essential hypertension 05/18/2011   Healthcare maintenance 05/18/2011   Alzheimer's disease (HCC) 05/17/2007   Home Medication(s) Prior to Admission medications   Medication Sig Start Date End Date Taking? Authorizing Provider  aspirin -acetaminophen -caffeine (EXCEDRIN MIGRAINE) 250-250-65 MG tablet Take 1 tablet by mouth every 6 (six) hours as needed for headache.    [provider]  furosemide  (LASIX ) 40 MG tablet Take 1 tablet (40 mg total) by mouth daily. 04/09/22 08/07/22  Ozell Blunt, MD  lisinopril  (ZESTRIL ) 20 MG tablet Take 1 tablet (20 mg total) by mouth daily. 04/10/22   Ozell Blunt, MD  loratadine  (CLARITIN ) 10 MG tablet Take 10 mg by mouth daily. 04/16/20   [provider]  nystatin  (MYCOSTATIN /NYSTOP ) powder Apply 1 application topically daily as needed (itching/rash).    [provider]  polyvinyl alcohol (LIQUIFILM TEARS) 1.4 % ophthalmic solution Place 1 drop into both eyes daily as needed for dry eyes.    [provider]  potassium chloride  (KLOR-CON  M) 10 MEQ tablet Take 1 tablet (10 mEq total) by mouth daily. 04/09/22   Ozell Blunt, MD  triamcinolone (KENALOG) 0.025 % cream Apply 1 application topically daily as needed (rash/itching).  03/08/18   [provider]  Vitamin D , Ergocalciferol , (DRISDOL) 1.25 MG (50000 UNIT) CAPS capsule Take 50,000 Units by mouth every Sunday. 05/09/20   [provider]                                                                                                                                    Past Surgical History Past Surgical History:  Procedure Laterality Date   CESAREAN SECTION     DILATION AND CURETTAGE OF UTERUS     ECTOPIC PREGNANCY SURGERY     KIDNEY STONE SURGERY     REMOVAL   MASTECTOMY     R breast    TRACHEOSTOMY     Family History Family History  Problem Relation Age of Onset   Hypertension Mother     Hypertension Sister    Cancer Sister        Unknown cancer    Social History Social History   Tobacco Use   Smoking status: Never   Smokeless tobacco: Never  Vaping Use   Vaping status: Never Used  Substance Use Topics   Alcohol use: No   Drug use: No   Allergies Patient has no known allergies.  Review of Systems Review of Systems  Physical Exam Vital Signs  I have reviewed the triage vital signs BP 137/79 (BP Location: Right Arm)   Pulse 69   Temp 97.9 F (36.6 C) (Oral)   Resp 18   Ht 5\' 1"  (1.549 m)   Wt 133.4 kg   SpO2 93%   BMI 55.55 kg/m   Physical Exam Vitals and nursing note reviewed.  Constitutional:      Appearance: She is not toxic-appearing.  HENT:     Head: Normocephalic and atraumatic.  Eyes:     Pupils: Pupils are equal, round, and reactive to light.  Cardiovascular:     Rate and Rhythm: Normal rate.  Pulmonary:     Effort: Pulmonary effort is normal.  Abdominal:     General: Abdomen is flat. There is no distension.     Palpations: Abdomen is soft.     Tenderness: There is no abdominal tenderness. There is no guarding.  Musculoskeletal:        General: No swelling or deformity. Normal range of motion.  Skin:    General: Skin is warm.  Neurological:     General: No focal deficit present.     Mental Status: She is alert and oriented to person, place, and time.     Cranial Nerves: No cranial nerve deficit.     Motor: No weakness.     ED Results and Treatments Labs (all labs ordered are listed, but only abnormal results are displayed) Labs Reviewed  COMPREHENSIVE METABOLIC PANEL WITH GFR - Abnormal; Notable for the following components:      Result Value   Glucose, Bld 106 (*)    BUN 6 (*)    Calcium 8.4 (*)    Total Protein 6.1 (*)    Albumin 3.1 (*)    All other components within normal limits  CBC WITH  DIFFERENTIAL/PLATELET                                                                                                                           Radiology CT Head Wo Contrast Result Date: 04/09/2024 CLINICAL DATA:  Delirium, seizure disorder noncompliant with medication EXAM: CT HEAD WITHOUT CONTRAST TECHNIQUE: Contiguous axial images were obtained from the base of the skull through the vertex without intravenous contrast. RADIATION DOSE REDUCTION: This exam was performed according to the departmental dose-optimization program which includes automated exposure control, adjustment of the mA and/or kV according to patient size and/or use of iterative reconstruction technique. COMPARISON:  08/25/2023 FINDINGS: Brain: No acute infarct or hemorrhage. 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 No acute extra-axial fluid collections. No mass effect. Vascular: No hyperdense vessel or unexpected calcification. Skull: Normal. Negative for fracture or focal lesion. Sinuses/Orbits: No acute finding. Stable bilateral mastoid effusions. Other: None. IMPRESSION: 1. Stable head CT, no acute intracranial process. Electronically Signed   By: Bobbye Burrow M.D.   On: 04/09/2024 18:02   DG Chest Portable 1 View Result Date: 04/09/2024 CLINICAL DATA:  Cough. EXAM: PORTABLE CHEST 1 VIEW COMPARISON:  06/15/2023. FINDINGS: Low lung volume. Bilateral lung fields are clear. Bilateral costophrenic angles are clear. Stable cardio-mediastinal silhouette. No acute osseous abnormalities. The soft tissues are within normal limits. IMPRESSION: No active disease. Electronically Signed   By: Beula Brunswick M.D.   On: 04/09/2024 14:44    Pertinent labs & imaging results that were available during my care of the patient were reviewed by me and considered in my medical decision making (see MDM for details).  Medications Ordered in ED Medications  levETIRAcetam  (KEPPRA ) undiluted injection 1,500 mg (1,500 mg Intravenous Given 04/09/24 1452)                                                                                                                                      Procedures Procedures  (including critical care time)  Medical Decision Making / ED Course   This patient presents to the ED for concern of possible seizure, this involves an extensive number of treatment options, and is a complaint that carries with it a high risk of complications and morbidity.  The differential diagnosis includes seizure, less likely syncope.  MDM: 83 year old female here today for probable seizure.  Patient not postictal upon arrival.  Obtain CT images of patient's head which was negative.  Basic blood work ordered which was also normal.  There is report of the patient requiring supplemental O2, however patient is saturating 98% on room air.  Believe the patient's symptoms are likely due to her missing multiple Keppra  doses.  Had a lengthy conversation with the patient and family regarding this.  Will provide them with outpatient urology follow-up.  Will discharge.  Reassessment 6:50 PM-nursing staff was alerted that family was concerned about the patient's O2 saturation dropping while the patient sleeps.  When patient awakes, O2 sat is in the mid 90s without any tachypnea.  Patient likely has sleep apnea.  Advised on obtaining outpatient sleep study.   Additional history obtained: -Additional history obtained from family bed -External records from outside source obtained and reviewed including: Chart review including previous notes, labs, imaging, consultation notes   Lab Tests: -I ordered, reviewed, and interpreted labs.   The pertinent results include:   Labs Reviewed  COMPREHENSIVE METABOLIC PANEL WITH GFR - Abnormal; Notable for the following components:      Result Value   Glucose, Bld 106 (*)    BUN 6 (*)    Calcium 8.4 (*)    Total Protein 6.1 (*)    Albumin 3.1 (*)    All other components within normal limits  CBC WITH DIFFERENTIAL/PLATELET      EKG patient's EKG did not  crossover, however at 04/09/2024 at 1528, normal sinus rhythm, normal intervals, no ST segment depressions or elevations.  EKG Interpretation Date/Time:    Ventricular Rate:    PR Interval:    QRS Duration:    QT Interval:    QTC Calculation:   R Axis:      Text Interpretation:           Imaging Studies ordered: I ordered imaging studies including CT head I independently visualized and interpreted imaging. I agree with the radiologist interpretation   Medicines ordered and prescription drug management: Meds ordered this encounter  Medications   levETIRAcetam  (KEPPRA ) undiluted injection 1,500 mg    -I have reviewed the patients home medicines and have made adjustments as needed  Cardiac Monitoring: The patient was maintained on a cardiac monitor.  I personally viewed and interpreted the cardiac monitored which showed an underlying rhythm of: Normal sinus rhythm  Social Determinants of Health:  Factors impacting patients care include: Lack of access to primary care   Reevaluation: After the interventions noted above, I reevaluated the patient and found that they have :improved  Co morbidities that complicate the patient evaluation  Past Medical History:  Diagnosis Date   Abnormal EKG    Allergic rhinitis    Alzheimer's disease (HCC) 05/17/07   Arthritis of knee    Breast cancer (HCC)    Diastolic dysfunction    Dilated aortic root (HCC)    GERD (gastroesophageal reflux disease)    Hypertension    Kidney stones    Mild aortic insufficiency    Morbid obesity (HCC)    Sleep apnea    with insomnia   Venous (peripheral) insufficiency       Dispostion: I considered admission for this patient, however she is appropriate for outpatient workup.     Final Clinical Impression(s) / ED Diagnoses Final diagnoses:  Seizure [facility])     @PCDICTATION @    Afton Horse T, DO 04/09/24 Jerre Moots    Afton Horse T, DO 04/09/24 1853

## 2024-06-10 ENCOUNTER — Inpatient Hospital Stay (HOSPITAL_COMMUNITY)

## 2024-06-10 ENCOUNTER — Other Ambulatory Visit: Payer: Self-pay

## 2024-06-10 ENCOUNTER — Encounter (HOSPITAL_COMMUNITY): Payer: Self-pay

## 2024-06-10 ENCOUNTER — Emergency Department (HOSPITAL_COMMUNITY)

## 2024-06-10 ENCOUNTER — Inpatient Hospital Stay (HOSPITAL_COMMUNITY)
Admission: EM | Admit: 2024-06-10 | Discharge: 2024-06-16 | DRG: 177 | Disposition: A | Discharge status: Home / Self Care | Attending: Internal Medicine | Admitting: Internal Medicine

## 2024-06-10 DIAGNOSIS — Z6841 Body Mass Index (BMI) 40.0 and over, adult: Secondary | ICD-10-CM | POA: Diagnosis not present

## 2024-06-10 DIAGNOSIS — K529 Noninfective gastroenteritis and colitis, unspecified: Secondary | ICD-10-CM

## 2024-06-10 DIAGNOSIS — G9341 Metabolic encephalopathy: Secondary | ICD-10-CM | POA: Diagnosis present

## 2024-06-10 DIAGNOSIS — G4733 Obstructive sleep apnea (adult) (pediatric): Secondary | ICD-10-CM | POA: Diagnosis present

## 2024-06-10 DIAGNOSIS — Z87442 Personal history of urinary calculi: Secondary | ICD-10-CM | POA: Diagnosis not present

## 2024-06-10 DIAGNOSIS — F02A0 Dementia in other diseases classified elsewhere, mild, without behavioral disturbance, psychotic disturbance, mood disturbance, and anxiety: Secondary | ICD-10-CM | POA: Diagnosis present

## 2024-06-10 DIAGNOSIS — G309 Alzheimer's disease, unspecified: Secondary | ICD-10-CM | POA: Diagnosis present

## 2024-06-10 DIAGNOSIS — Z853 Personal history of malignant neoplasm of breast: Secondary | ICD-10-CM

## 2024-06-10 DIAGNOSIS — R011 Cardiac murmur, unspecified: Secondary | ICD-10-CM | POA: Diagnosis present

## 2024-06-10 DIAGNOSIS — Z79899 Other long term (current) drug therapy: Secondary | ICD-10-CM | POA: Diagnosis not present

## 2024-06-10 DIAGNOSIS — J9601 Acute respiratory failure with hypoxia: Secondary | ICD-10-CM | POA: Diagnosis present

## 2024-06-10 DIAGNOSIS — R7881 Bacteremia: Secondary | ICD-10-CM | POA: Diagnosis present

## 2024-06-10 DIAGNOSIS — I5032 Chronic diastolic (congestive) heart failure: Secondary | ICD-10-CM | POA: Diagnosis present

## 2024-06-10 DIAGNOSIS — G40909 Epilepsy, unspecified, not intractable, without status epilepticus: Secondary | ICD-10-CM | POA: Diagnosis present

## 2024-06-10 DIAGNOSIS — Z7982 Long term (current) use of aspirin: Secondary | ICD-10-CM

## 2024-06-10 DIAGNOSIS — B955 Unspecified streptococcus as the cause of diseases classified elsewhere: Secondary | ICD-10-CM

## 2024-06-10 DIAGNOSIS — I38 Endocarditis, valve unspecified: Secondary | ICD-10-CM | POA: Diagnosis not present

## 2024-06-10 DIAGNOSIS — E66813 Obesity, class 3: Secondary | ICD-10-CM | POA: Diagnosis present

## 2024-06-10 DIAGNOSIS — E869 Volume depletion, unspecified: Secondary | ICD-10-CM | POA: Diagnosis present

## 2024-06-10 DIAGNOSIS — J690 Pneumonitis due to inhalation of food and vomit: Secondary | ICD-10-CM | POA: Diagnosis present

## 2024-06-10 DIAGNOSIS — K5900 Constipation, unspecified: Secondary | ICD-10-CM | POA: Diagnosis present

## 2024-06-10 DIAGNOSIS — Z8249 Family history of ischemic heart disease and other diseases of the circulatory system: Secondary | ICD-10-CM | POA: Diagnosis not present

## 2024-06-10 DIAGNOSIS — E669 Obesity, unspecified: Secondary | ICD-10-CM | POA: Diagnosis present

## 2024-06-10 DIAGNOSIS — I110 Hypertensive heart disease with heart failure: Secondary | ICD-10-CM | POA: Diagnosis present

## 2024-06-10 DIAGNOSIS — L03319 Cellulitis of trunk, unspecified: Secondary | ICD-10-CM | POA: Diagnosis present

## 2024-06-10 DIAGNOSIS — L304 Erythema intertrigo: Secondary | ICD-10-CM | POA: Diagnosis present

## 2024-06-10 DIAGNOSIS — A491 Streptococcal infection, unspecified site: Secondary | ICD-10-CM | POA: Diagnosis not present

## 2024-06-10 DIAGNOSIS — R4182 Altered mental status, unspecified: Secondary | ICD-10-CM | POA: Diagnosis not present

## 2024-06-10 DIAGNOSIS — B954 Other streptococcus as the cause of diseases classified elsewhere: Secondary | ICD-10-CM | POA: Diagnosis present

## 2024-06-10 DIAGNOSIS — I10 Essential (primary) hypertension: Secondary | ICD-10-CM | POA: Diagnosis not present

## 2024-06-10 DIAGNOSIS — B372 Candidiasis of skin and nail: Secondary | ICD-10-CM | POA: Diagnosis present

## 2024-06-10 DIAGNOSIS — R569 Unspecified convulsions: Secondary | ICD-10-CM

## 2024-06-10 DIAGNOSIS — R519 Headache, unspecified: Secondary | ICD-10-CM | POA: Diagnosis present

## 2024-06-10 DIAGNOSIS — Z91148 Patient's other noncompliance with medication regimen for other reason: Secondary | ICD-10-CM

## 2024-06-10 DIAGNOSIS — I872 Venous insufficiency (chronic) (peripheral): Secondary | ICD-10-CM | POA: Diagnosis present

## 2024-06-10 DIAGNOSIS — Z809 Family history of malignant neoplasm, unspecified: Secondary | ICD-10-CM

## 2024-06-10 DIAGNOSIS — Z9011 Acquired absence of right breast and nipple: Secondary | ICD-10-CM

## 2024-06-10 LAB — CBC WITH DIFFERENTIAL/PLATELET
Abs Immature Granulocytes: 0.1 K/uL — ABNORMAL HIGH (ref 0.00–0.07)
Basophils Absolute: 0 K/uL (ref 0.0–0.1)
Basophils Relative: 0 %
Eosinophils Absolute: 0 K/uL (ref 0.0–0.5)
Eosinophils Relative: 0 %
HCT: 46.5 % — ABNORMAL HIGH (ref 36.0–46.0)
Hemoglobin: 15.1 g/dL — ABNORMAL HIGH (ref 12.0–15.0)
Immature Granulocytes: 1 %
Lymphocytes Relative: 9 %
Lymphs Abs: 1.1 K/uL (ref 0.7–4.0)
MCH: 31.6 pg (ref 26.0–34.0)
MCHC: 32.5 g/dL (ref 30.0–36.0)
MCV: 97.3 fL (ref 80.0–100.0)
Monocytes Absolute: 0.6 K/uL (ref 0.1–1.0)
Monocytes Relative: 5 %
Neutro Abs: 10 K/uL — ABNORMAL HIGH (ref 1.7–7.7)
Neutrophils Relative %: 85 %
Platelets: 201 K/uL (ref 150–400)
RBC: 4.78 MIL/uL (ref 3.87–5.11)
RDW: 12.9 % (ref 11.5–15.5)
WBC: 11.8 K/uL — ABNORMAL HIGH (ref 4.0–10.5)
nRBC: 0 % (ref 0.0–0.2)

## 2024-06-10 LAB — COMPREHENSIVE METABOLIC PANEL WITH GFR
ALT: 11 U/L (ref 0–44)
ALT: 10 U/L (ref 0–44)
AST: 27 U/L (ref 15–41)
AST: 23 U/L (ref 15–41)
Albumin: 3.5 g/dL (ref 3.5–5.0)
Albumin: 3 g/dL — ABNORMAL LOW (ref 3.5–5.0)
Alkaline Phosphatase: 79 U/L (ref 38–126)
Alkaline Phosphatase: 71 U/L (ref 38–126)
Anion gap: 9 (ref 5–15)
Anion gap: 10 (ref 5–15)
BUN: 11 mg/dL (ref 8–23)
BUN: 10 mg/dL (ref 8–23)
CO2: 22 mmol/L (ref 22–32)
CO2: 24 mmol/L (ref 22–32)
Calcium: 8.6 mg/dL — ABNORMAL LOW (ref 8.9–10.3)
Calcium: 8.5 mg/dL — ABNORMAL LOW (ref 8.9–10.3)
Chloride: 104 mmol/L (ref 98–111)
Chloride: 103 mmol/L (ref 98–111)
Creatinine, Ser: 0.92 mg/dL (ref 0.44–1.00)
Creatinine, Ser: 0.85 mg/dL (ref 0.44–1.00)
GFR, Estimated: >60 mL/min (ref >60)
GFR, Estimated: >60 mL/min (ref >60)
Glucose, Bld: 169 mg/dL — ABNORMAL HIGH (ref 70–99)
Glucose, Bld: 126 mg/dL — ABNORMAL HIGH (ref 70–99)
Potassium: 3.8 mmol/L (ref 3.5–5.1)
Potassium: 4.1 mmol/L (ref 3.5–5.1)
Sodium: 135 mmol/L (ref 135–145)
Sodium: 137 mmol/L (ref 135–145)
Total Bilirubin: 1.3 mg/dL — ABNORMAL HIGH (ref 0.0–1.2)
Total Bilirubin: 1.1 mg/dL (ref 0.0–1.2)
Total Protein: 6.8 g/dL (ref 6.5–8.1)
Total Protein: 6.4 g/dL — ABNORMAL LOW (ref 6.5–8.1)

## 2024-06-10 LAB — RESPIRATORY PANEL BY PCR

## 2024-06-10 LAB — BLOOD CULTURE ID PANEL (REFLEXED) - BCID2

## 2024-06-10 LAB — CBC
HCT: 44.7 % (ref 36.0–46.0)
Hemoglobin: 14.9 g/dL (ref 12.0–15.0)
MCH: 32.3 pg (ref 26.0–34.0)
MCHC: 33.3 g/dL (ref 30.0–36.0)
MCV: 97 fL (ref 80.0–100.0)
Platelets: 183 K/uL (ref 150–400)
RBC: 4.61 MIL/uL (ref 3.87–5.11)
RDW: 13.1 % (ref 11.5–15.5)
WBC: 17.9 K/uL — ABNORMAL HIGH (ref 4.0–10.5)
nRBC: 0 % (ref 0.0–0.2)

## 2024-06-10 LAB — RESP PANEL BY RT-PCR (RSV, FLU A&B, COVID)  RVPGX2
Influenza A by PCR: NEGATIVE
Influenza B by PCR: NEGATIVE
Resp Syncytial Virus by PCR: NEGATIVE
SARS Coronavirus 2 by RT PCR: NEGATIVE

## 2024-06-10 LAB — GLUCOSE, CAPILLARY
Glucose-Capillary: 131 mg/dL — ABNORMAL HIGH (ref 70–99)
Glucose-Capillary: 140 mg/dL — ABNORMAL HIGH (ref 70–99)
Glucose-Capillary: 120 mg/dL — ABNORMAL HIGH (ref 70–99)
Glucose-Capillary: 123 mg/dL — ABNORMAL HIGH (ref 70–99)

## 2024-06-10 LAB — TROPONIN I (HIGH SENSITIVITY): Troponin I (High Sensitivity): 13 ng/L (ref <18)

## 2024-06-10 MED ORDER — METOCLOPRAMIDE HCL 5 MG/ML IJ SOLN
5.0000 mg | INTRAMUSCULAR | Status: AC
  Administered 2024-06-10: 5 mg via INTRAVENOUS
  Filled 2024-06-10: qty 2

## 2024-06-10 MED ORDER — TRAMADOL HCL 50 MG PO TABS
50.0000 mg | ORAL_TABLET | Freq: Four times a day (QID) | ORAL | Status: DC | PRN
  Administered 2024-06-11 – 2024-06-15 (×5): 50 mg via ORAL
  Filled 2024-06-10 (×4): qty 1

## 2024-06-10 MED ORDER — SODIUM CHLORIDE 0.9% FLUSH
3.0000 mL | Freq: Two times a day (BID) | INTRAVENOUS | Status: DC
  Administered 2024-06-10 – 2024-06-15 (×15): 3 mL via INTRAVENOUS

## 2024-06-10 MED ORDER — SODIUM CHLORIDE 0.9 % IV SOLN
1.0000 g | Freq: Once | INTRAVENOUS | Status: DC
  Administered 2024-06-10: 1 g via INTRAVENOUS
  Filled 2024-06-10: qty 10

## 2024-06-10 MED ORDER — POLYETHYLENE GLYCOL 3350 17 G PO PACK
17.0000 g | PACK | Freq: Every day | ORAL | Status: DC | PRN
  Administered 2024-06-12: 17 g via ORAL
  Filled 2024-06-10: qty 1

## 2024-06-10 MED ORDER — ACETAMINOPHEN 500 MG PO TABS
1000.0000 mg | ORAL_TABLET | Freq: Four times a day (QID) | ORAL | Status: DC | PRN
  Administered 2024-06-10 – 2024-06-15 (×7): 1000 mg via ORAL
  Filled 2024-06-10 (×5): qty 2

## 2024-06-10 MED ORDER — MELATONIN 3 MG PO TABS: 6.0000 mg | ORAL_TABLET | Freq: Every evening | ORAL | Status: DC | PRN

## 2024-06-10 MED ORDER — ALBUTEROL SULFATE (2.5 MG/3ML) 0.083% IN NEBU: 2.5000 mg | INHALATION_SOLUTION | RESPIRATORY_TRACT | Status: DC | PRN

## 2024-06-10 MED ORDER — MELATONIN 3 MG PO TABS
6.0000 mg | ORAL_TABLET | Freq: Every day | ORAL | Status: DC
  Administered 2024-06-10: 6 mg via ORAL
  Filled 2024-06-10: qty 2

## 2024-06-10 MED ORDER — VERAPAMIL HCL ER 180 MG PO TBCR
180.0000 mg | EXTENDED_RELEASE_TABLET | Freq: Every day | ORAL | Status: DC
  Administered 2024-06-10 – 2024-06-16 (×10): 180 mg via ORAL
  Filled 2024-06-10 (×7): qty 1

## 2024-06-10 MED ORDER — LEVETIRACETAM 250 MG PO TABS
500.0000 mg | ORAL_TABLET | Freq: Every day | ORAL | Status: DC
  Administered 2024-06-10 – 2024-06-16 (×10): 500 mg via ORAL
  Filled 2024-06-10 (×7): qty 2

## 2024-06-10 MED ORDER — SODIUM CHLORIDE 0.9 % IV SOLN
500.0000 mg | Freq: Once | INTRAVENOUS | Status: AC
  Administered 2024-06-10: 500 mg via INTRAVENOUS
  Filled 2024-06-10: qty 5

## 2024-06-10 MED ORDER — SODIUM CHLORIDE 0.9 % IV SOLN
3.0000 g | Freq: Four times a day (QID) | INTRAVENOUS | Status: DC
  Administered 2024-06-10 – 2024-06-11 (×6): 3 g via INTRAVENOUS
  Filled 2024-06-10 (×6): qty 8

## 2024-06-10 MED ORDER — HYDROXYZINE HCL 25 MG PO TABS
25.0000 mg | ORAL_TABLET | Freq: Two times a day (BID) | ORAL | Status: DC | PRN
  Administered 2024-06-15 (×2): 25 mg via ORAL
  Filled 2024-06-10: qty 1

## 2024-06-10 MED ORDER — ASPIRIN 81 MG PO TBEC
81.0000 mg | DELAYED_RELEASE_TABLET | Freq: Every day | ORAL | Status: DC
Start: 2024-06-10 — End: 2024-06-16
  Administered 2024-06-10 – 2024-06-16 (×10): 81 mg via ORAL
  Filled 2024-06-10 (×7): qty 1

## 2024-06-10 MED ORDER — INSULIN ASPART 100 UNIT/ML IJ SOLN: 0.0000 [IU] | Freq: Three times a day (TID) | INTRAMUSCULAR | Status: DC

## 2024-06-10 MED ORDER — LACTATED RINGERS IV SOLN: INTRAVENOUS | Status: DC

## 2024-06-10 MED ORDER — FUROSEMIDE 10 MG/ML IJ SOLN
40.0000 mg | Freq: Every day | INTRAMUSCULAR | Status: DC
  Administered 2024-06-10: 40 mg via INTRAVENOUS
  Filled 2024-06-10: qty 4

## 2024-06-10 MED ORDER — PROCHLORPERAZINE EDISYLATE 10 MG/2ML IJ SOLN: 10.0000 mg | Freq: Four times a day (QID) | INTRAMUSCULAR | Status: DC | PRN

## 2024-06-10 MED ORDER — ONDANSETRON HCL 4 MG/2ML IJ SOLN: 4.0000 mg | Freq: Four times a day (QID) | INTRAMUSCULAR | Status: DC | PRN

## 2024-06-10 NOTE — ED Triage Notes (Signed)
 PT brought in by [facility], EMS states they was called out for a headache, nausea and vomiting. PT states history of dementia and unable to walk around like normal due to weakness. PT HR was 116. PT was vomiting arrived vomiting. PT was negative for stroke screen per EMS.

## 2024-06-10 NOTE — Progress Notes (Addendum)
 PROGRESS NOTE  Carol Martin  FMW:[id number] DOB: Oct 19, 1941 DOA: 06/10/2024 PCP: Shelda Atlas, MD  Consultants  Brief Narrative:  83 y.o. female with hx of dementia, seizure disorder, HFpEF, hypertension, venous insufficiency, OSA, aortic ectasia, who was brought in with headache, nausea, vomiting.  She had witnessed aspiration by EMS and requiring O2 in the ED. Admitting physician spoke with patient's daughter over the phone, reports that N/V and HA all began today. No c/o abdominal pain, diarrhea, or bloody stool to her knowledge. Is typically constipated. No seizure like activity. No fever, chills, cough / cold-symptoms. reports that at her baseline she can be disoriented but is able to be alert and converse. She requires some assistance with transfers / some ADLS. But able to feed herself. She ambulates with the use of a walker.     Assessment & Plan: 83 y.o. female with hx dementia, seizure disorder, HFpEF, hypertension, venous insufficiency, OSA, aortic ectasia, who was brought in with headache, nausea, vomiting.  She had witnessed aspiration by EMS and requiring O2 in the ED. Admitted with aspiration pneumonitis / pneumonia    Acute hypoxic respiratory failure Aspiration pneumonitis, possible pneumonia Witnessed aspiration by family/EMS.  On initial evaluation she is requiring 2 L O2 was 81% on room air.  Chest x-ray demonstrating airspace and interstitial opacity left greater than right base questionable aspiration or pneumonia.  Clinically most likely more so aspiration pneumonitis but will cover for possible pneumonia. - Continue Unasyn , azithromycin  - Albuterol  nebs, I-S, flutter valve - Remains on oxygen today.  She is also notably fluid overloaded on exam and I am restarting diuresis at 40 mg furosemide  IV daily.  Will follow/trend labs. - Leukocytosis noted and will trend.  Most likely secondary to aspiration pneumonia   Nausea and vomiting Suspect related to gastroenteritis -  Stopped lactated Ringer's as she was notably fluid overloaded on examination. - Zofran /Compazine  as needed - Sips of clear liquid for now, advance as tolerates.  No further nausea/vomiting since admission.   Encephalopathy  Suspect related to above--> namely aspiration pneumonia. CT Head neg for acute  -- Delirium precautions, fall precautions. Scheduled melatonin  -Still somewhat encephalopathic.  Complaining of headache.  CT head negative for any acute issues. -Has not received any analgesics.  Will start with Tylenol , she does take tramadol  on an outpatient basis for occasional headaches.   Dementia: Not on medication  Seizure disorder: Continue on Keppra  500 mg daily  HFpEF: Without acute exacerbation, now overloaded on exam.  Restarted diuretics as above.  Hold home torsemide   Hypertension:  - Home BP meds held on admission. -BP trending upwards.  Holding lisinopril /HCTZ will restart verapamil .  Venous insufficiency: Noted  OSA: Would avoid CPAP considering her recent aspiration  Aortic ectasia: Outpatient surveillance    DVT prophylaxis:  SCDs Start: 06/10/24 0520  Code Status:   Code Status: Full Code Level of care: Telemetry Medical Status is: Inpatient   Consults called: N/A  Subjective: Patient lying in bed.  Awakens and will talk with me.  Oriented to person.  Complaining of bilateral frontal headache worse when she opens her eyes.  Reports that her breathing feels better than admission.  Objective: Vitals:   06/10/24 0557 06/10/24 0700 06/10/24 0804 06/10/24 1141  BP: (!) 144/81 (!) 141/78 (!) 164/82   Pulse:  86    Resp: (!) 22 (!) 26    Temp: 98.8 F (37.1 C)   99.8 F (37.7 C)  TempSrc: Oral   Oral  SpO2:  100%    Weight:      Height:        Intake/Output Summary (Last 24 hours) at 06/10/2024 1421 Last data filed at 06/10/2024 1100 Gross per 24 hour  Intake 275 ml  Output 400 ml  Net -125 ml   Filed Weights   06/10/24 0142  Weight: 134 kg    Body mass index is 52.33 kg/m.  Gen: 83 y.o. female in no apparent distress.  Nontoxic Pulm: Non-labored breathing.  Wearing nasal cannula.  Crackles/rhonchi at bases CV: Regular rate and rhythm.  Grade 2 systolic ejection murmur noted GI: Abdomen soft, non-tender, non-distended, with normoactive bowel sounds. No organomegaly or masses felt. Ext: Warm, no deformities, +4 bilateral pedal edema extending to at least knees Skin: No rashes, lesions  Neuro: Alert and oriented to person and moving arms and legs symmetrically.  Pupils equal reactive to light bilaterally.  Extraocular movements intact. Psych: Calm  Judgement and insight appear normal. Mood & affect appropriate.     I have personally reviewed the following labs and images: CBC: Recent Labs  Lab 06/10/24 0155 06/10/24 1258  WBC 11.8* 17.9*  NEUTROABS 10.0*  --   HGB 15.1* 14.9  HCT 46.5* 44.7  MCV 97.3 97.0  PLT 201 183   BMP &GFR Recent Labs  Lab 06/10/24 0155  NA 135  K 3.8  CL 104  CO2 22  GLUCOSE 169*  BUN 11  CREATININE 0.92  CALCIUM 8.6*   Estimated Creatinine Clearance: 63.3 mL/min (by C-G formula based on SCr of 0.92 mg/dL). Liver & Pancreas: Recent Labs  Lab 06/10/24 0155  AST 27  ALT 11  ALKPHOS 79  BILITOT 1.3*  PROT 6.8  ALBUMIN 3.5   No results for input(s): LIPASE, AMYLASE in the last 168 hours. No results for input(s): AMMONIA in the last 168 hours. Diabetic: No results for input(s): HGBA1C in the last 72 hours. Recent Labs  Lab 06/10/24 0809 06/10/24 1235  GLUCAP 131* 140*   Cardiac Enzymes: No results for input(s): CKTOTAL, CKMB, CKMBINDEX, TROPONINI in the last 168 hours. No results for input(s): PROBNP in the last 8760 hours. Coagulation Profile: No results for input(s): INR, PROTIME in the last 168 hours. Thyroid  Function Tests: No results for input(s): TSH, T4TOTAL, FREET4, T3FREE, THYROIDAB in the last 72 hours. Lipid Profile: No  results for input(s): CHOL, HDL, LDLCALC, TRIG, CHOLHDL, LDLDIRECT in the last 72 hours. Anemia Panel: No results for input(s): VITAMINB12, FOLATE, FERRITIN, TIBC, IRON, RETICCTPCT in the last 72 hours. Urine analysis:    Component Value Date/Time   COLORURINE YELLOW 06/16/2023 0318   APPEARANCEUR HAZY (A) 06/16/2023 0318   LABSPEC 1.019 06/16/2023 0318   PHURINE 6.0 06/16/2023 0318   GLUCOSEU NEGATIVE 06/16/2023 0318   HGBUR SMALL (A) 06/16/2023 0318   BILIRUBINUR NEGATIVE 06/16/2023 0318   KETONESUR NEGATIVE 06/16/2023 0318   PROTEINUR NEGATIVE 06/16/2023 0318   UROBILINOGEN 0.2 05/23/2014 1548   NITRITE NEGATIVE 06/16/2023 0318   LEUKOCYTESUR TRACE (A) 06/16/2023 0318   Sepsis Labs: Invalid input(s): PROCALCITONIN, LACTICIDVEN  Microbiology: Recent Results (from the past 240 hours)  Resp panel by RT-PCR (RSV, Flu A&B, Covid) Anterior Nasal Swab     Status: None   Collection Time: 06/10/24  1:55 AM   Specimen: Anterior Nasal Swab  Result Value Ref Range Status   SARS Coronavirus 2 by RT PCR NEGATIVE NEGATIVE Final   Influenza A by PCR NEGATIVE NEGATIVE Final   Influenza B by PCR NEGATIVE NEGATIVE Final  Comment: (NOTE) The Xpert Xpress SARS-CoV-2/FLU/RSV plus assay is intended as an aid in the diagnosis of influenza from Nasopharyngeal swab specimens and should not be used as a sole basis for treatment. Nasal washings and aspirates are unacceptable for Xpert Xpress SARS-CoV-2/FLU/RSV testing.  Fact Sheet for Patients: BloggerCourse.com  Fact Sheet for Healthcare Providers: SeriousBroker.it  This test is not yet approved or cleared by the [place]  FDA and has been authorized for detection and/or diagnosis of SARS-CoV-2 by FDA under an Emergency Use Authorization (EUA). This EUA will remain in effect (meaning this test can be used) for the duration of the COVID-19 declaration under  Section 564(b)(1) of the Act, 21 U.S.C. section 360bbb-3(b)(1), unless the authorization is terminated or revoked.     Resp Syncytial Virus by PCR NEGATIVE NEGATIVE Final    Comment: (NOTE) Fact Sheet for Patients: BloggerCourse.com  Fact Sheet for Healthcare Providers: SeriousBroker.it  This test is not yet approved or cleared by the [place]  FDA and has been authorized for detection and/or diagnosis of SARS-CoV-2 by FDA under an Emergency Use Authorization (EUA). This EUA will remain in effect (meaning this test can be used) for the duration of the COVID-19 declaration under Section 564(b)(1) of the Act, 21 U.S.C. section 360bbb-3(b)(1), unless the authorization is terminated or revoked.  Performed at [facility], [address] [place], [place] [address]   Respiratory (~20 pathogens) panel by PCR     Status: None   Collection Time: 06/10/24  1:55 AM   Specimen: Nasopharyngeal Swab; Respiratory  Result Value Ref Range Status   Adenovirus NOT DETECTED NOT DETECTED Final   Coronavirus 229E NOT DETECTED NOT DETECTED Final    Comment: (NOTE) The Coronavirus on the Respiratory Panel, DOES NOT test for the novel  Coronavirus (2019 nCoV)    Coronavirus HKU1 NOT DETECTED NOT DETECTED Final   Coronavirus NL63 NOT DETECTED NOT DETECTED Final   Coronavirus OC43 NOT DETECTED NOT DETECTED Final   Metapneumovirus NOT DETECTED NOT DETECTED Final   Rhinovirus / Enterovirus NOT DETECTED NOT DETECTED Final   Influenza A NOT DETECTED NOT DETECTED Final   Influenza B NOT DETECTED NOT DETECTED Final   Parainfluenza Virus 1 NOT DETECTED NOT DETECTED Final   Parainfluenza Virus 2 NOT DETECTED NOT DETECTED Final   Parainfluenza Virus 3 NOT DETECTED NOT DETECTED Final   Parainfluenza Virus 4 NOT DETECTED NOT DETECTED Final   Respiratory Syncytial Virus NOT DETECTED NOT DETECTED Final   Bordetella pertussis NOT DETECTED NOT DETECTED  Final   Bordetella Parapertussis NOT DETECTED NOT DETECTED Final   Chlamydophila pneumoniae NOT DETECTED NOT DETECTED Final   Mycoplasma pneumoniae NOT DETECTED NOT DETECTED Final    Comment: Performed at [facility], [name], [place], [place] [address]  Blood culture (routine x 2)     Status: None (Preliminary result)   Collection Time: 06/10/24  3:34 AM   Specimen: BLOOD RIGHT HAND  Result Value Ref Range Status   Specimen Description BLOOD RIGHT HAND  Final   Special Requests   Final    BOTTLES DRAWN AEROBIC AND ANAEROBIC Blood Culture adequate volume   Culture   Final    NO GROWTH < 12 HOURS Performed at [facility], [address] [place], [place] [address]    Report Status PENDING  Incomplete  Blood culture (routine x 2)     Status: None (Preliminary result)   Collection Time: 06/10/24  3:34 AM   Specimen: BLOOD  Result Value Ref Range Status   Specimen Description BLOOD RIGHT ANTECUBITAL  Final   Special Requests   Final    BOTTLES DRAWN AEROBIC AND ANAEROBIC Blood Culture adequate volume   Culture   Final    NO GROWTH < 12 HOURS Performed at [facility], [address], [place], [place] [address]    Report Status PENDING  Incomplete    Radiology Studies: DG Abd 1 View Result Date: 06/10/2024 CLINICAL DATA:  [id number].  Intractable nausea and vomiting. EXAM: ABDOMEN - 1 VIEW COMPARISON:  None Available. FINDINGS: The bowel gas pattern is nonobstructive mild-to-moderate fecal stasis. No radio-opaque calculi or other significant radiographic abnormality are seen. There is degenerative change of the lumbar spine. IMPRESSION: Nonobstructive bowel gas pattern with mild-to-moderate fecal stasis. Electronically Signed   By: Francis Quam M.D.   On: 06/10/2024 05:44   CT Head Wo Contrast Result Date: 06/10/2024 CLINICAL DATA:  Headache, new onset (Age >= 51y) headache, vomiting EXAM: CT HEAD WITHOUT CONTRAST TECHNIQUE: Contiguous axial images were obtained  from the base of the skull through the vertex without intravenous contrast. RADIATION DOSE REDUCTION: This exam was performed according to the departmental dose-optimization program which includes automated exposure control, adjustment of the mA and/or kV according to patient size and/or use of iterative reconstruction technique. COMPARISON:  CT head 08/25/2023 FINDINGS: Brain: No evidence of large-territorial acute infarction. No parenchymal hemorrhage. No mass lesion. No extra-axial collection. No mass effect or midline shift. No hydrocephalus. Basilar cisterns are patent. 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 Vascular: No hyperdense vessel. Skull: No acute fracture or focal lesion. Sinuses/Orbits: Paranasal sinuses and mastoid air cells are clear. The orbits are unremarkable. Other: None. IMPRESSION: 1. No acute intracranial abnormality. 2. 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 Electronically Signed   By: Morgane  Naveau M.D.   On: 06/10/2024 03:09   DG Chest Port 1 View Result Date: 06/10/2024 CLINICAL DATA:  Vomiting an aspiration risk, headache, nausea EXAM: PORTABLE CHEST 1 VIEW COMPARISON:  04/09/2024 FINDINGS: Stable enlargement of the cardiomediastinal silhouette. Airspace and interstitial opacities in the left greater than right lung bases. No definite pleural effusion. No pneumothorax. IMPRESSION: Airspace and interstitial opacities in the left greater than right lung bases may be due to aspiration or pneumonia. Electronically Signed   By: Norman Gatlin M.D.   On: 06/10/2024 02:17    Scheduled Meds:  aspirin  EC  81 mg Oral Q lunch   furosemide   40 mg Intravenous Daily   insulin  aspart  0-6 Units Subcutaneous TID WC   levETIRAcetam   500 mg Oral Q lunch   melatonin  6 mg Oral QHS   sodium chloride  flush  3 mL Intravenous Q12H   Continuous Infusions:  ampicillin -sulbactam (UNASYN ) IV 3 g (06/10/24 1305)   lactated ringers  100 mL/hr at 06/10/24 0648     LOS: 0  days   35 minutes with more than 50% spent in reviewing records, counseling patient/family and coordinating care.  Reyes VEAR Gaw, MD [facility] [email or web address] 06/10/2024, 2:21 PM

## 2024-06-10 NOTE — Progress Notes (Signed)
 PHARMACY - PHYSICIAN COMMUNICATION CRITICAL VALUE ALERT - BLOOD CULTURE IDENTIFICATION (BCID)  Carol Martin is an 83 y.o. female who presented to [facility] on 06/10/2024 with PNA  Assessment:  Blood cultures with GPC in 1/4 bottles and BCID with strep species  Name of physician (or Provider) Contacted: D. Girguis  Current antibiotics: Unasyn   Changes to prescribed antibiotics recommended:  -Continue Unasyn  and follow further plans in the morning  Results for orders placed or performed during the hospital encounter of 06/10/24  Blood Culture ID Panel (Reflexed) (Collected: 06/10/2024  3:34 AM)  Result Value Ref Range   Enterococcus faecalis NOT DETECTED NOT DETECTED   Enterococcus Faecium NOT DETECTED NOT DETECTED   Listeria monocytogenes NOT DETECTED NOT DETECTED   Staphylococcus species NOT DETECTED NOT DETECTED   Staphylococcus aureus (BCID) NOT DETECTED NOT DETECTED   Staphylococcus epidermidis NOT DETECTED NOT DETECTED   Staphylococcus lugdunensis NOT DETECTED NOT DETECTED   Streptococcus species DETECTED (A) NOT DETECTED   Streptococcus agalactiae NOT DETECTED NOT DETECTED   Streptococcus pneumoniae NOT DETECTED NOT DETECTED   Streptococcus pyogenes NOT DETECTED NOT DETECTED   A.calcoaceticus-baumannii NOT DETECTED NOT DETECTED   Bacteroides fragilis NOT DETECTED NOT DETECTED   Enterobacterales NOT DETECTED NOT DETECTED   Enterobacter cloacae complex NOT DETECTED NOT DETECTED   Escherichia coli NOT DETECTED NOT DETECTED   Klebsiella aerogenes NOT DETECTED NOT DETECTED   Klebsiella oxytoca NOT DETECTED NOT DETECTED   Klebsiella pneumoniae NOT DETECTED NOT DETECTED   Proteus species NOT DETECTED NOT DETECTED   Salmonella species NOT DETECTED NOT DETECTED   Serratia marcescens NOT DETECTED NOT DETECTED   Haemophilus influenzae NOT DETECTED NOT DETECTED   Neisseria meningitidis NOT DETECTED NOT DETECTED   Pseudomonas aeruginosa NOT DETECTED NOT DETECTED   Stenotrophomonas  maltophilia NOT DETECTED NOT DETECTED   Candida albicans NOT DETECTED NOT DETECTED   Candida auris NOT DETECTED NOT DETECTED   Candida glabrata NOT DETECTED NOT DETECTED   Candida krusei NOT DETECTED NOT DETECTED   Candida parapsilosis NOT DETECTED NOT DETECTED   Candida tropicalis NOT DETECTED NOT DETECTED   Cryptococcus neoformans/gattii NOT DETECTED NOT DETECTED    Prentice Poisson, PharmD Clinical Pharmacist **Pharmacist phone directory can now be found on [email or web address] ([email or web address]).  Listed under [facility].

## 2024-06-10 NOTE — ED Provider Notes (Signed)
 [facility] Provider Note   CSN: [id number] Arrival date & time: 06/10/24  0136     Patient presents with: Headache and Emesis   JAKEISHA STRICKER is a 83 y.o. female.   The history is provided by the patient and medical records.  Headache Associated symptoms: vomiting   Emesis Associated symptoms: headaches    83 y.o. F with hx of dementia, CHF, HTN, presenting to the ED with EMS for nausea/vomiting.  Apparently was normal state of health earlier today but later this evening began complaining of headache and had several episodes of nausea and vomiting.  Daughter called EMS when she began having difficulty breathing, did have episode of emesis just prior to arrival so possible aspiration.  She has not had any fever.  BP has been elevated with EMS.  She has had 4 of Zofran  without improvement of her vomiting.  Does have some lower extremity edema but this is chronic per family.  Currently on 2L supplemental O2, generally not oxygen dependent.  She is not currently on anticoagulation.  Prior to Admission medications   Medication Sig Start Date End Date Taking? Authorizing Provider  aspirin -acetaminophen -caffeine (EXCEDRIN MIGRAINE) 250-250-65 MG tablet Take 1 tablet by mouth every 6 (six) hours as needed for headache.    [provider]  furosemide  (LASIX ) 40 MG tablet Take 1 tablet (40 mg total) by mouth daily. 04/09/22 08/07/22  Drusilla Sabas RAMAN, MD  lisinopril  (ZESTRIL ) 20 MG tablet Take 1 tablet (20 mg total) by mouth daily. 04/10/22   Drusilla Sabas RAMAN, MD  loratadine  (CLARITIN ) 10 MG tablet Take 10 mg by mouth daily. 04/16/20   [provider]  nystatin  (MYCOSTATIN /NYSTOP ) powder Apply 1 application topically daily as needed (itching/rash).    [provider]  polyvinyl alcohol (LIQUIFILM TEARS) 1.4 % ophthalmic solution Place 1 drop into both eyes daily as needed for dry eyes.    [provider]  potassium chloride   (KLOR-CON  M) 10 MEQ tablet Take 1 tablet (10 mEq total) by mouth daily. 04/09/22   Drusilla Sabas RAMAN, MD  triamcinolone (KENALOG) 0.025 % cream Apply 1 application topically daily as needed (rash/itching).  03/08/18   [provider]  Vitamin D , Ergocalciferol , (DRISDOL) 1.25 MG (50000 UNIT) CAPS capsule Take 50,000 Units by mouth every Sunday. 05/09/20   [provider]    Allergies: Patient has no known allergies.    Review of Systems  Gastrointestinal:  Positive for vomiting.  Neurological:  Positive for headaches.  All other systems reviewed and are negative.   Updated Vital Signs BP (!) 134/121   Pulse (!) 116   Temp 99 F (37.2 C) (Oral)   Resp (!) 24   Ht 5' 3 (1.6 m)   Wt 134 kg   SpO2 96%   BMI 52.33 kg/m   Physical Exam Vitals and nursing note reviewed.  Constitutional:      Appearance: She is well-developed.     Comments: Elderly, active emesis observed during exam  HENT:     Head: Normocephalic and atraumatic.  Eyes:     Conjunctiva/sclera: Conjunctivae normal.     Pupils: Pupils are equal, round, and reactive to light.  Cardiovascular:     Rate and Rhythm: Normal rate and regular rhythm.     Heart sounds: Normal heart sounds.  Pulmonary:     Effort: Pulmonary effort is normal.     Breath sounds: Normal breath sounds.  Abdominal:  General: Bowel sounds are normal.     Palpations: Abdomen is soft.  Musculoskeletal:        General: Normal range of motion.     Cervical back: Normal range of motion.     Comments: Chronic appearing edema of BLE  Skin:    General: Skin is warm and dry.  Neurological:     Mental Status: She is alert.     Comments: Oriented to self, no gross motor deficits     (all labs ordered are listed, but only abnormal results are displayed) Labs Reviewed  CBC WITH DIFFERENTIAL/PLATELET - Abnormal; Notable for the following components:      Result Value   WBC 11.8 (*)    Hemoglobin 15.1 (*)    HCT 46.5 (*)    Neutro  Abs 10.0 (*)    Abs Immature Granulocytes 0.10 (*)    All other components within normal limits  COMPREHENSIVE METABOLIC PANEL WITH GFR - Abnormal; Notable for the following components:   Glucose, Bld 169 (*)    Calcium 8.6 (*)    Total Bilirubin 1.3 (*)    All other components within normal limits  RESP PANEL BY RT-PCR (RSV, FLU A&B, COVID)  [id number]  CULTURE, BLOOD (ROUTINE X 2)  CULTURE, BLOOD (ROUTINE X 2)  URINALYSIS, W/ REFLEX TO CULTURE (INFECTION SUSPECTED)  TROPONIN I (HIGH SENSITIVITY)  TROPONIN I (HIGH SENSITIVITY)    EKG: None  Radiology: CT Head Wo Contrast Result Date: 06/10/2024 CLINICAL DATA:  Headache, new onset (Age >= 51y) headache, vomiting EXAM: CT HEAD WITHOUT CONTRAST TECHNIQUE: Contiguous axial images were obtained from the base of the skull through the vertex without intravenous contrast. RADIATION DOSE REDUCTION: This exam was performed according to the departmental dose-optimization program which includes automated exposure control, adjustment of the mA and/or kV according to patient size and/or use of iterative reconstruction technique. COMPARISON:  CT head 08/25/2023 FINDINGS: Brain: No evidence of large-territorial acute infarction. No parenchymal hemorrhage. No mass lesion. No extra-axial collection. No mass effect or midline shift. No hydrocephalus. Basilar cisterns are patent. 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 Vascular: No hyperdense vessel. Skull: No acute fracture or focal lesion. Sinuses/Orbits: Paranasal sinuses and mastoid air cells are clear. The orbits are unremarkable. Other: None. IMPRESSION: 1. No acute intracranial abnormality. 2. 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 Electronically Signed   By: Morgane  Naveau M.D.   On: 06/10/2024 03:09   DG Chest Port 1 View Result Date: 06/10/2024 CLINICAL DATA:  Vomiting an aspiration risk, headache, nausea EXAM: PORTABLE CHEST 1 VIEW COMPARISON:  04/09/2024 FINDINGS: Stable  enlargement of the cardiomediastinal silhouette. Airspace and interstitial opacities in the left greater than right lung bases. No definite pleural effusion. No pneumothorax. IMPRESSION: Airspace and interstitial opacities in the left greater than right lung bases may be due to aspiration or pneumonia. Electronically Signed   By: Norman Gatlin M.D.   On: 06/10/2024 02:17     Procedures   CRITICAL CARE Performed by: Olam CHRISTELLA Slocumb   Total critical care time: 45 minutes  Critical care time was exclusive of separately billable procedures and treating other patients.  Critical care was necessary to treat or prevent imminent or life-threatening deterioration.  Critical care was time spent personally by me on the following activities: development of treatment plan with patient and/or surrogate as well as nursing, discussions with consultants, evaluation of patient's response to treatment, examination of patient, obtaining history from patient or surrogate, ordering and performing treatments  and interventions, ordering and review of laboratory studies, ordering and review of radiographic studies, pulse oximetry and re-evaluation of patient's condition.   Medications Ordered in the ED  cefTRIAXone  (ROCEPHIN ) 1 g in sodium chloride  0.9 % 100 mL IVPB (has no administration in time range)  azithromycin  (ZITHROMAX ) 500 mg in sodium chloride  0.9 % 250 mL IVPB (has no administration in time range)  metoCLOPramide  (REGLAN ) injection 5 mg (5 mg Intravenous Given 06/10/24 0146)                                    Medical Decision Making Amount and/or Complexity of Data Reviewed Labs: ordered. Radiology: ordered and independent interpretation performed. ECG/medicine tests: ordered and independent interpretation performed.  Risk Prescription drug management. Decision regarding hospitalization.   83 year old female presenting to the ED via EMS for headache, nausea, and vomiting.  Has also been  generally weak today.  She does have a history of dementia and not really able to give further details at this time.  She is actively vomiting on exam.  She is tachycardic, hypertensive and hypoxic.  Suspect possibly aspiration en route.  Will obtain EKG, labs, CT head, chest x-ray.  Labs as above--minimal leukocytosis at 11.6.  She has no electrolyte derangement.  Normal renal function.  Chest x-ray with likely aspiration pneumonia.  CT head is negative.  Will obtain blood cultures and start on antibiotics.  3:13 AM Patient reassessed, she has not had any further emesis.  Vitals are currently stable on 3 L.  Daughter at bedside, updated on findings of today.  She will require admission.  Discussed with Dr. Segars-- will admit for ongoing care.  Final diagnoses:  Aspiration pneumonia of left upper lobe due to vomit [facility])  Acute respiratory failure with hypoxia [facility])    ED Discharge Orders     None          Jarold Olam HERO, PA-C 06/10/24 0404    Jerral Meth, MD 06/10/24 2259

## 2024-06-10 NOTE — H&P (Signed)
 History and Physical    Carol Martin FMW:[id number] DOB: 1941/05/11 DOA: 06/10/2024  PCP: Shelda Atlas, MD   Patient coming from: Home   Chief Complaint:  Chief Complaint  Patient presents with   Headache   Emesis    HPI: Limited due to AMS  Carol Martin is a 83 y.o. female with hx of dementia, seizure disorder, HFpEF, hypertension, venous insufficiency, OSA, aortic ectasia, who was brought in with headache, nausea, vomiting.  She had witnessed aspiration by EMS and requiring O2 in the ED. I spoke with patients daughter over the phone, reports that N/V and HA all began today. No c/o abdominal pain, diarrhea, or bloody stool to her knowledge. Is typically constipated. No seizure like activity. No fever, chills, cough / cold-symptoms. reports that at her baseline she can be disoriented but is able to be alert and converse. She requires some assistance with transfers / some ADLS. But able to feed herself. She ambulates with the use of a walker.     Review of Systems:  ROS complete and negative except as marked above   No Known Allergies  Prior to Admission medications   Medication Sig Start Date End Date Taking? Authorizing Provider  acetaminophen  (TYLENOL ) 500 MG tablet Take 1,000 mg by mouth every 6 (six) hours as needed for mild pain (pain score 1-3) or headache.   Yes [provider]  aspirin  EC 81 MG tablet Take 81 mg by mouth daily with lunch. 07/05/21  Yes [provider]  aspirin -acetaminophen -caffeine (EXCEDRIN MIGRAINE) 250-250-65 MG tablet Take 1 tablet by mouth every 6 (six) hours as needed for headache.   Yes [provider]  hydrOXYzine  (ATARAX ) 25 MG tablet Take 25 mg by mouth 2 (two) times daily as needed. 05/18/24  Yes [provider]  levETIRAcetam  (KEPPRA ) 500 MG tablet Take 500 mg by mouth daily with lunch. 04/08/24  Yes [provider]  lisinopril -hydrochlorothiazide  (ZESTORETIC ) 20-12.5 MG tablet Take 1 tablet by mouth  daily with lunch. 04/08/24  Yes [provider]  loratadine  (CLARITIN ) 10 MG tablet Take 10 mg by mouth daily as needed for allergies or rhinitis. 04/16/20  Yes [provider]  Multiple Vitamin (MULTI-VITAMIN) tablet Take 1 tablet by mouth daily with lunch. 07/05/21  Yes [provider]  nystatin  (MYCOSTATIN /NYSTOP ) powder Apply 1 application topically daily as needed (itching/rash).   Yes [provider]  polyvinyl alcohol (LIQUIFILM TEARS) 1.4 % ophthalmic solution Place 1 drop into both eyes daily as needed for dry eyes.   Yes [provider]  potassium chloride  (KLOR-CON  M) 10 MEQ tablet Take 1 tablet (10 mEq total) by mouth daily. Patient taking differently: Take 10 mEq by mouth daily as needed (fluid and edema). Take one tablet by mouth  as needed when taking torsemide . 04/09/22  Yes Drusilla, Sabas RAMAN, MD  torsemide  (DEMADEX ) 20 MG tablet Take 20-40 mg by mouth daily as needed (fluid and edema). Take one tablet by mouth daily as needed along with potassium. 04/15/24  Yes [provider]  traMADol  (ULTRAM ) 50 MG tablet Take 50 mg by mouth every 6 (six) hours as needed for moderate pain (pain score 4-6). 08/09/17  Yes [provider]  triamcinolone cream (KENALOG) 0.1 % Apply 1 Application topically 2 (two) times daily as needed (itching). 05/18/24  Yes [provider]  verapamil  (CALAN -SR) 180 MG CR tablet Take 180 mg by mouth daily with lunch. 03/30/24  Yes [provider]  Vitamin D , Ergocalciferol , (DRISDOL) 1.25  MG (50000 UNIT) CAPS capsule Take 50,000 Units by mouth every Sunday. 05/09/20  Yes [provider]    Past Medical History:  Diagnosis Date   Abnormal EKG    Allergic rhinitis    Alzheimer's disease (HCC) 05/17/07   Arthritis of knee    Breast cancer (HCC)    Diastolic dysfunction    Dilated aortic root (HCC)    GERD (gastroesophageal reflux disease)    Hypertension    Kidney stones    Mild aortic  insufficiency    Morbid obesity (HCC)    Sleep apnea    with insomnia   Venous (peripheral) insufficiency     Past Surgical History:  Procedure Laterality Date   CESAREAN SECTION     DILATION AND CURETTAGE OF UTERUS     ECTOPIC PREGNANCY SURGERY     KIDNEY STONE SURGERY     REMOVAL   MASTECTOMY     R breast    TRACHEOSTOMY       reports that she has never smoked. She has never used smokeless tobacco. She reports that she does not drink alcohol and does not use drugs.  Family History  Problem Relation Age of Onset   Hypertension Mother    Hypertension Sister    Cancer Sister        Unknown cancer     Physical Exam: Vitals:   06/10/24 0137 06/10/24 0141 06/10/24 0142 06/10/24 0445  BP:  (!) 134/121  134/82  Pulse:  (!) 116  90  Resp:  (!) 24  (!) 22  Temp:  99 F (37.2 C)    TempSrc:  Oral    SpO2: 95% 96%  94%  Weight:   134 kg   Height:   5' 3 (1.6 m)     Gen: somnolent, opens eyes to mild nonpainful stimulation then falls back asleep quickly. Chronically ill appearing.  CV: Regular, normal S1, S2, no murmurs  Resp: Normal WOB, on [place], limited by habitus, coarse anteriorly  Abd: Obese, normoactive, nontender MSK: Symmetric, 2+ woody edema, venous stasis changes.  Skin: See MSK for more findings  Neuro: somnolent, opens eyes to mild nonpainful stimulation then falls back asleep quickly.  Psych: encephalopathic    Data review:   Labs reviewed, notable for:   Chemistries unremarkable WBC 11   Micro:  Results for orders placed or performed during the hospital encounter of 06/10/24  Resp panel by RT-PCR (RSV, Flu A&B, Covid) Anterior Nasal Swab     Status: None   Collection Time: 06/10/24  1:55 AM   Specimen: Anterior Nasal Swab  Result Value Ref Range Status   SARS Coronavirus 2 by RT PCR NEGATIVE NEGATIVE Final   Influenza A by PCR NEGATIVE NEGATIVE Final   Influenza B by PCR NEGATIVE NEGATIVE Final    Comment: (NOTE) The Xpert Xpress  SARS-CoV-2/FLU/RSV plus assay is intended as an aid in the diagnosis of influenza from Nasopharyngeal swab specimens and should not be used as a sole basis for treatment. Nasal washings and aspirates are unacceptable for Xpert Xpress SARS-CoV-2/FLU/RSV testing.  Fact Sheet for Patients: BloggerCourse.com  Fact Sheet for Healthcare Providers: SeriousBroker.it  This test is not yet approved or cleared by the [place]  FDA and has been authorized for detection and/or diagnosis of SARS-CoV-2 by FDA under an Emergency Use Authorization (EUA). This EUA will remain in effect (meaning this test can be used) for the duration of the COVID-19 declaration under Section 564(b)(1) of the Act, 21 U.S.C.  section 360bbb-3(b)(1), unless the authorization is terminated or revoked.     Resp Syncytial Virus by PCR NEGATIVE NEGATIVE Final    Comment: (NOTE) Fact Sheet for Patients: BloggerCourse.com  Fact Sheet for Healthcare Providers: SeriousBroker.it  This test is not yet approved or cleared by the [place]  FDA and has been authorized for detection and/or diagnosis of SARS-CoV-2 by FDA under an Emergency Use Authorization (EUA). This EUA will remain in effect (meaning this test can be used) for the duration of the COVID-19 declaration under Section 564(b)(1) of the Act, 21 U.S.C. section 360bbb-3(b)(1), unless the authorization is terminated or revoked.  Performed at [facility], [address] [place], [place] [address]     Imaging reviewed:  CT Head Wo Contrast Result Date: 06/10/2024 CLINICAL DATA:  Headache, new onset (Age >= 51y) headache, vomiting EXAM: CT HEAD WITHOUT CONTRAST TECHNIQUE: Contiguous axial images were obtained from the base of the skull through the vertex without intravenous contrast. RADIATION DOSE REDUCTION: This exam was performed according to the  departmental dose-optimization program which includes automated exposure control, adjustment of the mA and/or kV according to patient size and/or use of iterative reconstruction technique. COMPARISON:  CT head 08/25/2023 FINDINGS: Brain: No evidence of large-territorial acute infarction. No parenchymal hemorrhage. No mass lesion. No extra-axial collection. No mass effect or midline shift. No hydrocephalus. Basilar cisterns are patent. 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 Vascular: No hyperdense vessel. Skull: No acute fracture or focal lesion. Sinuses/Orbits: Paranasal sinuses and mastoid air cells are clear. The orbits are unremarkable. Other: None. IMPRESSION: 1. No acute intracranial abnormality. 2. 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 Electronically Signed   By: Morgane  Naveau M.D.   On: 06/10/2024 03:09   DG Chest Port 1 View Result Date: 06/10/2024 CLINICAL DATA:  Vomiting an aspiration risk, headache, nausea EXAM: PORTABLE CHEST 1 VIEW COMPARISON:  04/09/2024 FINDINGS: Stable enlargement of the cardiomediastinal silhouette. Airspace and interstitial opacities in the left greater than right lung bases. No definite pleural effusion. No pneumothorax. IMPRESSION: Airspace and interstitial opacities in the left greater than right lung bases may be due to aspiration or pneumonia. Electronically Signed   By: Norman Gatlin M.D.   On: 06/10/2024 02:17    EKG:  Personally reviewed, sinus tachycardia, with PVC, LAD, LVH, T wave inversion laterally.  ED Course:  Treatments ceftriaxone , azithromycin , Reglan    Assessment/Plan:  83 y.o. female with hx dementia, seizure disorder, HFpEF, hypertension, venous insufficiency, OSA, aortic ectasia, who was brought in with headache, nausea, vomiting.  She had witnessed aspiration by EMS and requiring O2 in the ED. Admitted with aspiration pneumonitis / pneumonia   Acute hypoxic respiratory failure Aspiration pneumonitis, possible  pneumonia Witnessed aspiration by family/EMS.  On initial evaluation she is requiring 2 L O2 was 81% on room air.  Chest x-ray demonstrating airspace and interstitial opacity left greater than right base questionable aspiration or pneumonia.  Clinically most likely more so aspiration pneumonitis but will cover for possible pneumonia. - Continue Unasyn , azithromycin  - Check procalcitonin, RVP, sputum culture - Albuterol  nebs, I-S, flutter valve  Nausea and vomiting Suspect related to gastroenteritis - Give LR at 100 cc an hour until able to take p.o.'s - Zofran /Compazine  as needed - Sips of clear liquid for now, advance as tolerates  Encephalopathy  Suspect related to above. CT Head neg for acute  -- Delirium precautions, fall precautions. Scheduled melatonin   Chronic medical problems: Dementia: Not on medication Seizure disorder: Continue on  Keppra  500 mg daily HFpEF: Without acute exacerbation, appears dry.  Hold home torsemide  Hypertension: Hold home lisinopril -HCTZ, verapamil  in the setting of volume depletion.  BP acceptable Venous insufficiency: Noted OSA: Would avoid CPAP considering her recent aspiration Aortic ectasia: Outpatient surveillance   Body mass index is 52.33 kg/m.  Morbid obesity would benefit from weight loss outpatient  DVT prophylaxis:  SCDs Code Status:  Full Code; confirmed with daughter  Diet:  Diet Orders (From admission, onward)    None      Family Communication:  Yes spoke with daughter [name] over the phone   Consults:  None   Admission status:   Inpatient, Telemetry bed  Severity of Illness: The appropriate patient status for this patient is INPATIENT. Inpatient status is judged to be reasonable and necessary in order to provide the required intensity of service to ensure the patient's safety. The patient's presenting symptoms, physical exam findings, and initial radiographic and laboratory data in the context of their chronic comorbidities is  felt to place them at high risk for further clinical deterioration. Furthermore, it is not anticipated that the patient will be medically stable for discharge from the hospital within 2 midnights of admission.   * I certify that at the point of admission it is my clinical judgment that the patient will require inpatient hospital care spanning beyond 2 midnights from the point of admission due to high intensity of service, high risk for further deterioration and high frequency of surveillance required.*   Dorn Dawson, MD [facility]  How to contact the [facility] Attending or Consulting provider 7A - 7P or covering provider during after hours 7P -7A, for this patient.  Check the care team in [facility] and look for a) attending/consulting [facility] provider listed and b) the [facility] team listed Log into [email or web address] and use [facility]'s universal password to access. If you do not have the password, please contact the hospital operator. Locate the [facility] provider you are looking for under [facility] and page to a number that you can be directly reached. If you still have difficulty reaching the provider, please page the [facility] (Director on Call) for the Hospitalists listed on amion for assistance.  06/10/2024, 5:08 AM

## 2024-06-10 NOTE — TOC Initial Note (Signed)
 Transition of Care [facility]) - Initial/Assessment Note    Patient Details  Name: Carol Martin MRN: [id number] Date of Birth: August 10, 1941  Transition of Care [facility]) CM/SW Contact:    Nola Devere Hands, RN Phone Number: 06/10/2024, 1:52 PM  Clinical Narrative:                  83 yr old female, with hx of dementia, seizure disorder, HFpEF, hypertension, venous insufficiency, OSA, aortic ectasia, who was brought in from home with headache, nausea, vomiting.  Patient is mod assist, uses walker. Lives with her daughter, Carol Martin, [phone]. TOC Team will continue to follow.        Patient Goals and CMS Choice            Expected Discharge Plan and Services                                              Prior Living Arrangements/Services                       Activities of Daily Living   ADL Screening (condition at time of admission) Independently performs ADLs?: No Does the patient have a NEW difficulty with bathing/dressing/toileting/self-feeding that is expected to last >3 days?: Yes (Initiates electronic notice to provider for possible OT consult) Does the patient have a NEW difficulty with getting in/out of bed, walking, or climbing stairs that is expected to last >3 days?: Yes (Initiates electronic notice to provider for possible PT consult) Does the patient have a NEW difficulty with communication that is expected to last >3 days?: Yes (Initiates electronic notice to provider for possible SLP consult) Is the patient deaf or have difficulty hearing?: Yes Does the patient have difficulty seeing, even when wearing glasses/contacts?: No Does the patient have difficulty concentrating, remembering, or making decisions?: Yes  Permission Sought/Granted                  Emotional Assessment              Admission diagnosis:  Aspiration pneumonia (HCC) [J69.0] Acute respiratory failure with hypoxia (HCC) [J96.01] Aspiration pneumonia of left upper  lobe due to vomit (HCC) [J69.0] Patient Active Problem List   Diagnosis Date Noted   Aspiration pneumonia (HCC) 06/10/2024   Generalized weakness 04/06/2022   Allergic rhinitis 04/06/2022   Acute on chronic diastolic heart failure (HCC) 04/05/2022   COVID-19 06/23/2020   Chronic pain of both knees 02/11/2018   Bilateral hip pain 02/11/2018   Unilateral primary osteoarthritis, left knee 02/11/2018   Unilateral primary osteoarthritis, right knee 02/11/2018   Tinea corporis 04/29/2016   Cellulitis, abdominal wall 04/29/2016   Dilated aortic root (HCC)    Chronic diastolic CHF (congestive heart failure) (HCC)    Mild aortic insufficiency    Sleep apnea    Breast cancer (HCC) 02/08/2013   Abnormal EKG 05/18/2011   Dyspnea 05/18/2011   Essential hypertension 05/18/2011   Healthcare maintenance 05/18/2011   Alzheimer's disease (HCC) 05/17/2007   PCP:  Shelda Atlas, MD Pharmacy:   [facility] [phone] [place], [place] - [address] AT [address] [place] [address] Phone: [phone] Fax: [phone]  [facility] #[id number] [place], [place] - [address] AT [address] [place]  [address] Phone: [phone] Fax: [phone]  [facility] #[id number] [place], [place] - [address] AT [facility] & [address] [place] [address] Phone: [phone] Fax: [phone]     Social Drivers of Health (SDOH) Social History: SDOH Screenings   Food Insecurity: No Food Insecurity (09/09/2020)   Received from [facility]  Transportation Needs: No Transportation Needs (09/09/2020)   Received from [facility]  Financial Resource Strain: Low Risk  (09/09/2020)   Received from [facility]  Tobacco Use: Low Risk  (06/10/2024)   SDOH Interventions:     Readmission Risk Interventions     No data to display

## 2024-06-10 NOTE — Evaluation (Signed)
 Occupational Therapy Evaluation Patient Details Name: Carol Martin MRN: [id number] DOB: August 29, 1941 Today's Date: 06/10/2024   History of Present Illness   Carol Martin is a 83 yo female who presented after vomiting with aspiration. Admitted for aspiration pneumonia. PMHx: dementia, seizure disorder, HFpEF, hypertension, venous insufficiency, OSA, aortic ectasia     Clinical Impressions  [name] was evaluated s/p the above admission list. Per chart review, she ambulates with a RW, needs assist with transfers and ADLs from her daughter at baseline. Upon evaluation the pt was limited by low LOA, weakness and command following. Overall she is currently total A at bed level for all aspects of her care. Pt required maximal stimulation for brief periods of arousal. She stated her name and initiated a few non-functional movements at bed level. Pt will benefit from continued acute OT services and skilled inpatient follow up therapy, <3 hours/day.     If plan is discharge home, recommend the following:   A lot of help with walking and/or transfers;Two people to help with walking and/or transfers;A lot of help with bathing/dressing/bathroom;Two people to help with bathing/dressing/bathroom;Assistance with cooking/housework;Assistance with feeding;Direct supervision/assist for medications management;Direct supervision/assist for financial management;Assist for transportation;Help with stairs or ramp for entrance;Supervision due to cognitive status     Functional Status Assessment   Patient has had a recent decline in their functional status and demonstrates the ability to make significant improvements in function in a reasonable and predictable amount of time.     Equipment Recommendations   None recommended by OT      Precautions/Restrictions   Precautions Precautions: Fall Restrictions Weight Bearing Restrictions Per Provider Order: No     Mobility Bed Mobility Overal bed  mobility: Needs Assistance             General bed mobility comments: total A to attempt rolling and re-positioning    Transfers                   General transfer comment: unable to attempt on evaluation      Balance Overall balance assessment: Needs assistance             ADL either performed or assessed with clinical judgement   ADL Overall ADL's : Needs assistance/impaired           General ADL Comments: total A for all aspects of ADLs at bed level on evaluation due to arousal     Vision   Additional Comments: unable to assess, pt held eyes closed most of the session. Pt with good sustained attention when awake     Perception Perception: Not tested       Praxis Praxis: Not tested       Pertinent Vitals/Pain Pain Assessment Pain Assessment: Faces Pain Score: 0-No pain     Extremity/Trunk Assessment Upper Extremity Assessment Upper Extremity Assessment: Difficult to assess due to impaired cognition (pt moved BUEs, PROM of elbow, wrist and hands were [facility]. Overall difficult to assess due to arousal)   Lower Extremity Assessment Lower Extremity Assessment: Defer to PT evaluation   Cervical / Trunk Assessment Cervical / Trunk Assessment: Other exceptions Cervical / Trunk Exceptions: habitus   Communication Communication Communication: No apparent difficulties   Cognition Arousal: Obtunded Behavior During Therapy: Flat affect Cognition: History of cognitive impairments             OT - Cognition Comments: hx of dementia, pt required maximal stimulation to stay awake on evaluation. Minimal command follow/initiation of  movement                 Following commands: Impaired       Cueing  General Comments   Cueing Techniques: Verbal cues;Gestural cues  VSS - limited evaluation due to low LOA    Home Living Family/patient expects to be discharged to:: Private residence Living Arrangements: Children       Additional  Comments: pt unable to state home set up      Prior Functioning/Environment Prior Level of Function : Patient poor historian/Family not available             Mobility Comments: per chart review, pt uses RW and needs assist for transfers ADLs Comments: per chart review, daughter assists with ADLs    OT Problem List: Decreased range of motion;Decreased activity tolerance;Impaired balance (sitting and/or standing);Decreased safety awareness;Decreased knowledge of use of DME or AE;Decreased knowledge of precautions   OT Treatment/Interventions: Self-care/ADL training;DME and/or AE instruction;Therapeutic activities      OT Goals(Current goals can be found in the care plan section)   Acute Rehab OT Goals Patient Stated Goal: unable to state OT Goal Formulation: With patient Time For Goal Achievement: 06/24/24 Potential to Achieve Goals: Fair ADL Goals Pt Will Perform Grooming: with set-up Pt Will Perform Upper Body Dressing: with min assist;sitting Additional ADL Goal #1: Pt will complete bed mobility with mod A as a precursor to ADLs   OT Frequency:  Min 2X/week       AM-PAC OT 6 Clicks Daily Activity     Outcome Measure Help from another person eating meals?: Total Help from another person taking care of personal grooming?: Total Help from another person toileting, which includes using toliet, bedpan, or urinal?: Total Help from another person bathing (including washing, rinsing, drying)?: Total Help from another person to put on and taking off regular upper body clothing?: Total Help from another person to put on and taking off regular lower body clothing?: Total 6 Click Score: 6   End of Session Nurse Communication: Mobility status  Activity Tolerance: Patient limited by lethargy Patient left: in bed;with call bell/phone within reach;with bed alarm set  OT Visit Diagnosis: Unsteadiness on feet (R26.81);Other abnormalities of gait and mobility (R26.89);Muscle  weakness (generalized) (M62.81)                Time: 8574-8558 OT Time Calculation (min): 16 min Charges:  OT General Charges $OT Visit: 1 Visit OT Evaluation $OT Eval Moderate Complexity: 1 Mod  Lucie Kendall, OTR/L Acute Rehabilitation Services Office [phone] Secure Chat Communication Preferred   Lucie JONETTA Kendall 06/10/2024, 3:06 PM

## 2024-06-11 ENCOUNTER — Inpatient Hospital Stay (HOSPITAL_COMMUNITY)

## 2024-06-11 DIAGNOSIS — E669 Obesity, unspecified: Secondary | ICD-10-CM | POA: Diagnosis present

## 2024-06-11 DIAGNOSIS — A491 Streptococcal infection, unspecified site: Secondary | ICD-10-CM

## 2024-06-11 DIAGNOSIS — R569 Unspecified convulsions: Secondary | ICD-10-CM | POA: Diagnosis not present

## 2024-06-11 DIAGNOSIS — G9341 Metabolic encephalopathy: Secondary | ICD-10-CM | POA: Diagnosis present

## 2024-06-11 DIAGNOSIS — J690 Pneumonitis due to inhalation of food and vomit: Secondary | ICD-10-CM | POA: Diagnosis not present

## 2024-06-11 DIAGNOSIS — R4182 Altered mental status, unspecified: Secondary | ICD-10-CM

## 2024-06-11 LAB — CBC
HCT: 41.3 % (ref 36.0–46.0)
Hemoglobin: 13.4 g/dL (ref 12.0–15.0)
MCH: 31.7 pg (ref 26.0–34.0)
MCHC: 32.4 g/dL (ref 30.0–36.0)
MCV: 97.6 fL (ref 80.0–100.0)
Platelets: 150 K/uL (ref 150–400)
RBC: 4.23 MIL/uL (ref 3.87–5.11)
RDW: 13.2 % (ref 11.5–15.5)
WBC: 12 K/uL — ABNORMAL HIGH (ref 4.0–10.5)
nRBC: 0 % (ref 0.0–0.2)

## 2024-06-11 LAB — BASIC METABOLIC PANEL WITH GFR
Anion gap: 11 (ref 5–15)
BUN: 11 mg/dL (ref 8–23)
CO2: 24 mmol/L (ref 22–32)
Calcium: 8.7 mg/dL — ABNORMAL LOW (ref 8.9–10.3)
Chloride: 107 mmol/L (ref 98–111)
Creatinine, Ser: 0.81 mg/dL (ref 0.44–1.00)
GFR, Estimated: >60 mL/min (ref >60)
Glucose, Bld: 112 mg/dL — ABNORMAL HIGH (ref 70–99)
Potassium: 3.8 mmol/L (ref 3.5–5.1)
Sodium: 142 mmol/L (ref 135–145)

## 2024-06-11 LAB — PROCALCITONIN: Procalcitonin: 2.64 ng/mL

## 2024-06-11 LAB — GLUCOSE, CAPILLARY
Glucose-Capillary: 110 mg/dL — ABNORMAL HIGH (ref 70–99)
Glucose-Capillary: 117 mg/dL — ABNORMAL HIGH (ref 70–99)
Glucose-Capillary: 119 mg/dL — ABNORMAL HIGH (ref 70–99)
Glucose-Capillary: 120 mg/dL — ABNORMAL HIGH (ref 70–99)

## 2024-06-11 MED ORDER — SODIUM CHLORIDE 0.9 % IV SOLN
2.0000 g | INTRAVENOUS | Status: DC
  Administered 2024-06-11 – 2024-06-13 (×4): 2 g via INTRAVENOUS
  Filled 2024-06-11 (×3): qty 20

## 2024-06-11 MED ORDER — LEVETIRACETAM (KEPPRA) 500 MG/5 ML ADULT IV PUSH
1000.0000 mg | Freq: Once | INTRAVENOUS | Status: AC
  Administered 2024-06-11: 1000 mg via INTRAVENOUS
  Filled 2024-06-11: qty 10

## 2024-06-11 MED ORDER — LORAZEPAM 2 MG/ML IJ SOLN: 2.0000 mg | Freq: Once | INTRAMUSCULAR | Status: DC

## 2024-06-11 NOTE — Progress Notes (Signed)
   06/11/24 0747  Spiritual Encounters  Type of Visit Initial  Care provided to: Patient  Referral source Other (comment)  Reason for visit Routine spiritual support  OnCall Visit Yes  Spiritual Framework  Presenting Themes Rituals and practive  Patient Stress Factors Health changes  Family Stress Factors None identified  Interventions  Spiritual Care Interventions Made Compassionate presence;Established relationship of care and support;Prayer   Chaplain visited the Pt. At bedside and offered a word of prayer, Spiritual support was provided and services remain available as needed.

## 2024-06-11 NOTE — Progress Notes (Signed)
 Patient experienced another seizure lasting 1 minute and 30 seconds with family present. Facial twitching and jerking were described per family. Patient oxygen increased to 5L [place], patient placed in recovery position. Patient able to move all extremities with drowsy, flat affect. Rapid response was not called. Dr. Von notified of patient condition with new order of 1,000 mg Keppra  IV push placed and given by RN. Previously ordered Ativan  2mg  was not given due to Ativan  not being available/stocked on unit.

## 2024-06-11 NOTE — Significant Event (Signed)
 Rapid Response Event Note   Reason for Call : 3-4 minutes of Seizure activity  Initial Focused Assessment:  Upon entering room, patient seizure activity has stopped. Per patient family member, patient experienced twitching and jerking seizure activity similar to the patients seizure activity at home. Patient exhibiting flaccid RUE and RLE with slight right sided facial drooping during post-ictal state. Patient unresponsive to external stimuli during post-ictal state. Patient able to move RUE and RLE after 5 minutes, awake and alert following commands with slight right sided facial drooping still present. PERRLA throughout assessment.  Interventions: MD Notified Ativan  (lorazepam ) 2 mg ordered  Plan of Care:  Recovery position Oxygen Increased Ativan  (lorazepam ) 2 mg ordered   Event Summary:   MD Notified: [id number] Call Time: 0041 Arrival Time: N/A End Time:  Fairy JONETTA Held, RN

## 2024-06-11 NOTE — Progress Notes (Signed)
 PROGRESS NOTE    Carol Martin  FMW:[id number] DOB: Apr 02, 1941 DOA: 06/10/2024 PCP: Shelda Atlas, MD     Brief Narrative:  Carol Martin is a 83 y.o. female with hx of dementia (mild), seizure disorder, HFpEF, hypertension, venous insufficiency, OSA, aortic ectasia, who was brought in with headache, nausea, vomiting.  She had witnessed aspiration by EMS and requiring O2 in the ED. Daughter reports that at her baseline she can be disoriented but is able to be alert and converse. She requires some assistance with transfers / some ADLS. But able to feed herself. She ambulates with the use of a walker.     Patient was admitted with acute metabolic encephalopathy, nausea, vomiting and aspiration.  New events last 24 hours / Subjective: Daughter is at bedside who gives most of the history.  Patient was in her usual state of health when she complained of headaches on Thursday evening.  Headaches were difficult to characterize, just described as pain.  EMS was called, and patient had witnessed vomiting followed by aspiration.  She was transferred to the hospital at that time.  Daughter states that headache has improved since being in the hospital.  This has been managed with Tylenol .  No documented fevers or subjective fever/chills.  Had some abdominal pain.  No further nausea or vomiting.  Denies any shortness of breath.  Has constipation at baseline.  Daughter states that patient's mentation is slowed and not quite back to her baseline.  Patient has had longstanding seizure disorder > 8 years to the daughter's recollection.  Patient however is noncompliant with her Keppra , and has been known to be skipping her doses.   Assessment & Plan:   Principal Problem:   Aspiration pneumonia (HCC) Active Problems:   Essential hypertension   Acute metabolic encephalopathy   Seizure (HCC)   Obesity   Acute metabolic encephalopathy  - Concern for meningitis is low, patient has no fevers, no neck  stiffness.  She is able to follow all commands appropriately.  She is alert and oriented to self, city, month (but not to hospital) - At baseline, she has some forgetfulness but is conversant, independent of ADLs.  Uses walker for ambulation.  Lives at home with daughter. - Has seizure disorder and has been missing Keppra  dosing.  Check MRI brain and EEG  Aspiration pneumonitis - Witnessed aspiration of gastric contents by EMS - Empiric Unasyn  - SLP eval  Strep bacteremia - ID consulted   Seizure disorder - Keppra   Hypertension - Verapamil    Obesity - BMI 52.33    DVT prophylaxis:  SCDs Start: 06/10/24 0520  Code Status: Full code Family Communication: Daughter at bedside Disposition Plan: Home Status is: Inpatient Remains inpatient appropriate because: Further workup    Antimicrobials:  Anti-infectives (From admission, onward)    Start     Dose/Rate Route Frequency Ordered Stop   06/10/24 0600  Ampicillin -Sulbactam (UNASYN ) 3 g in sodium chloride  0.9 % 100 mL IVPB        3 g 200 mL/hr over 30 Minutes Intravenous Every 6 hours 06/10/24 0340     06/10/24 0300  cefTRIAXone  (ROCEPHIN ) 1 g in sodium chloride  0.9 % 100 mL IVPB  Status:  Discontinued        1 g 200 mL/hr over 30 Minutes Intravenous  Once 06/10/24 0258 06/10/24 0340   06/10/24 0300  azithromycin  (ZITHROMAX ) 500 mg in sodium chloride  0.9 % 250 mL IVPB        500 mg 250  mL/hr over 60 Minutes Intravenous  Once 06/10/24 0258 06/10/24 0449        Objective: Vitals:   06/11/24 0500 06/11/24 0600 06/11/24 0736 06/11/24 1134  BP:   135/78 139/88  Pulse: 64 67    Resp: (!) 25 (!) 25    Temp:   98.5 F (36.9 C) 98 F (36.7 C)  TempSrc:   Oral Oral  SpO2: 96% 96% 90%   Weight:      Height:        Intake/Output Summary (Last 24 hours) at 06/11/2024 1325 Last data filed at 06/11/2024 0700 Gross per 24 hour  Intake 2000.81 ml  Output 650 ml  Net 1350.81 ml   Filed Weights   06/10/24 0142  Weight:  134 kg    Examination:  General exam: Appears calm and comfortable  Respiratory system: Clear to auscultation. Respiratory effort normal. No respiratory distress. No conversational dyspnea.  Cardiovascular system: S1 & S2 heard, RRR. No murmurs. No pedal edema. Gastrointestinal system: Abdomen is nondistended, soft and nontender. Normal bowel sounds heard. Central nervous system: Alert and oriented to self, city, month. Strength weak but equal all extremities, CN 2-12 grossly intact, speech is garbled at times and difficult to understand  Extremities: Symmetric in appearance  Skin: No rashes, lesions or ulcers on exposed skin   Data Reviewed: I have personally reviewed following labs and imaging studies  CBC: Recent Labs  Lab 06/10/24 0155 06/10/24 1258 06/11/24 0611  WBC 11.8* 17.9* 12.0*  NEUTROABS 10.0*  --   --   HGB 15.1* 14.9 13.4  HCT 46.5* 44.7 41.3  MCV 97.3 97.0 97.6  PLT 201 183 150   Basic Metabolic Panel: Recent Labs  Lab 06/10/24 0155 06/10/24 1258 06/11/24 0611  NA 135 137 142  K 3.8 4.1 3.8  CL 104 103 107  CO2 22 24 24   GLUCOSE 169* 126* 112*  BUN 11 10 11   CREATININE 0.92 0.85 0.81  CALCIUM 8.6* 8.5* 8.7*   GFR: Estimated Creatinine Clearance: 71.9 mL/min (by C-G formula based on SCr of 0.81 mg/dL). Liver Function Tests: Recent Labs  Lab 06/10/24 0155 06/10/24 1258  AST 27 23  ALT 11 10  ALKPHOS 79 71  BILITOT 1.3* 1.1  PROT 6.8 6.4*  ALBUMIN 3.5 3.0*   No results for input(s): LIPASE, AMYLASE in the last 168 hours. No results for input(s): AMMONIA in the last 168 hours. Coagulation Profile: No results for input(s): INR, PROTIME in the last 168 hours. Cardiac Enzymes: No results for input(s): CKTOTAL, CKMB, CKMBINDEX, TROPONINI in the last 168 hours. BNP (last 3 results) No results for input(s): PROBNP in the last 8760 hours. HbA1C: No results for input(s): HGBA1C in the last 72 hours. CBG: Recent Labs  Lab  06/10/24 1632 06/10/24 2120 06/11/24 0021 06/11/24 0740 06/11/24 1136  GLUCAP 120* 123* 120* 110* 119*   Lipid Profile: No results for input(s): CHOL, HDL, LDLCALC, TRIG, CHOLHDL, LDLDIRECT in the last 72 hours. Thyroid  Function Tests: No results for input(s): TSH, T4TOTAL, FREET4, T3FREE, THYROIDAB in the last 72 hours. Anemia Panel: No results for input(s): VITAMINB12, FOLATE, FERRITIN, TIBC, IRON, RETICCTPCT in the last 72 hours. Sepsis Labs: Recent Labs  Lab 06/11/24 0611  PROCALCITON 2.64    Recent Results (from the past 240 hours)  Resp panel by RT-PCR (RSV, Flu A&B, Covid) Anterior Nasal Swab     Status: None   Collection Time: 06/10/24  1:55 AM   Specimen: Anterior Nasal Swab  Result  Value Ref Range Status   SARS Coronavirus 2 by RT PCR NEGATIVE NEGATIVE Final   Influenza A by PCR NEGATIVE NEGATIVE Final   Influenza B by PCR NEGATIVE NEGATIVE Final    Comment: (NOTE) The Xpert Xpress SARS-CoV-2/FLU/RSV plus assay is intended as an aid in the diagnosis of influenza from Nasopharyngeal swab specimens and should not be used as a sole basis for treatment. Nasal washings and aspirates are unacceptable for Xpert Xpress SARS-CoV-2/FLU/RSV testing.  Fact Sheet for Patients: BloggerCourse.com  Fact Sheet for Healthcare Providers: SeriousBroker.it  This test is not yet approved or cleared by the [place]  FDA and has been authorized for detection and/or diagnosis of SARS-CoV-2 by FDA under an Emergency Use Authorization (EUA). This EUA will remain in effect (meaning this test can be used) for the duration of the COVID-19 declaration under Section 564(b)(1) of the Act, 21 U.S.C. section 360bbb-3(b)(1), unless the authorization is terminated or revoked.     Resp Syncytial Virus by PCR NEGATIVE NEGATIVE Final    Comment: (NOTE) Fact Sheet for  Patients: BloggerCourse.com  Fact Sheet for Healthcare Providers: SeriousBroker.it  This test is not yet approved or cleared by the [place]  FDA and has been authorized for detection and/or diagnosis of SARS-CoV-2 by FDA under an Emergency Use Authorization (EUA). This EUA will remain in effect (meaning this test can be used) for the duration of the COVID-19 declaration under Section 564(b)(1) of the Act, 21 U.S.C. section 360bbb-3(b)(1), unless the authorization is terminated or revoked.  Performed at [facility], [address] [place], [place] [address]   Respiratory (~20 pathogens) panel by PCR     Status: None   Collection Time: 06/10/24  1:55 AM   Specimen: Nasopharyngeal Swab; Respiratory  Result Value Ref Range Status   Adenovirus NOT DETECTED NOT DETECTED Final   Coronavirus 229E NOT DETECTED NOT DETECTED Final    Comment: (NOTE) The Coronavirus on the Respiratory Panel, DOES NOT test for the novel  Coronavirus (2019 nCoV)    Coronavirus HKU1 NOT DETECTED NOT DETECTED Final   Coronavirus NL63 NOT DETECTED NOT DETECTED Final   Coronavirus OC43 NOT DETECTED NOT DETECTED Final   Metapneumovirus NOT DETECTED NOT DETECTED Final   Rhinovirus / Enterovirus NOT DETECTED NOT DETECTED Final   Influenza A NOT DETECTED NOT DETECTED Final   Influenza B NOT DETECTED NOT DETECTED Final   Parainfluenza Virus 1 NOT DETECTED NOT DETECTED Final   Parainfluenza Virus 2 NOT DETECTED NOT DETECTED Final   Parainfluenza Virus 3 NOT DETECTED NOT DETECTED Final   Parainfluenza Virus 4 NOT DETECTED NOT DETECTED Final   Respiratory Syncytial Virus NOT DETECTED NOT DETECTED Final   Bordetella pertussis NOT DETECTED NOT DETECTED Final   Bordetella Parapertussis NOT DETECTED NOT DETECTED Final   Chlamydophila pneumoniae NOT DETECTED NOT DETECTED Final   Mycoplasma pneumoniae NOT DETECTED NOT DETECTED Final    Comment: Performed at  [facility], [address] [place], [place] [address]  Blood culture (routine x 2)     Status: Abnormal (Preliminary result)   Collection Time: 06/10/24  3:34 AM   Specimen: BLOOD RIGHT HAND  Result Value Ref Range Status   Specimen Description BLOOD RIGHT HAND  Final   Special Requests   Final    BOTTLES DRAWN AEROBIC AND ANAEROBIC Blood Culture adequate volume   Culture  Setup Time   Final    GRAM POSITIVE COCCI IN CHAINS AEROBIC BOTTLE ONLY CRITICAL VALUE NOTED.  VALUE  IS CONSISTENT WITH PREVIOUSLY REPORTED AND CALLED VALUE.    Culture (A)  Final    VIRIDANS STREPTOCOCCUS IDENTIFICATION AND SUSCEPTIBILITIES TO FOLLOW Performed at [facility], [address], [place], [place] [address]    Report Status PENDING  Incomplete  Blood culture (routine x 2)     Status: Abnormal (Preliminary result)   Collection Time: 06/10/24  3:34 AM   Specimen: BLOOD  Result Value Ref Range Status   Specimen Description BLOOD RIGHT ANTECUBITAL  Final   Special Requests   Final    BOTTLES DRAWN AEROBIC AND ANAEROBIC Blood Culture adequate volume   Culture  Setup Time   Final    GRAM POSITIVE COCCI IN PAIRS IN CHAINS ANAEROBIC BOTTLE ONLY CRITICAL RESULT CALLED TO, READ BACK BY AND VERIFIED WITH: MAYA PRENTICE POISSON [id number] AT 2036 BY EC    Culture (A)  Final    STREPTOCOCCUS SPECIES NON HEMOLYTIC IDENTIFICATION AND SUSCEPTIBILITIES TO FOLLOW Performed at [facility], [address], [place], [place] [address]    Report Status PENDING  Incomplete  Blood Culture ID Panel (Reflexed)     Status: Abnormal   Collection Time: 06/10/24  3:34 AM  Result Value Ref Range Status   Enterococcus faecalis NOT DETECTED NOT DETECTED Final   Enterococcus Faecium NOT DETECTED NOT DETECTED Final   Listeria monocytogenes NOT DETECTED NOT DETECTED Final   Staphylococcus species NOT DETECTED NOT DETECTED Final   Staphylococcus aureus (BCID) NOT DETECTED NOT DETECTED Final   Staphylococcus  epidermidis NOT DETECTED NOT DETECTED Final   Staphylococcus lugdunensis NOT DETECTED NOT DETECTED Final   Streptococcus species DETECTED (A) NOT DETECTED Final    Comment: Not Enterococcus species, Streptococcus agalactiae, Streptococcus pyogenes, or Streptococcus pneumoniae. CRITICAL RESULT CALLED TO, READ BACK BY AND VERIFIED WITH: MAYA PRENTICE POISSON [id number] AT 2034 BY EC    Streptococcus agalactiae NOT DETECTED NOT DETECTED Final   Streptococcus pneumoniae NOT DETECTED NOT DETECTED Final   Streptococcus pyogenes NOT DETECTED NOT DETECTED Final   A.calcoaceticus-baumannii NOT DETECTED NOT DETECTED Final   Bacteroides fragilis NOT DETECTED NOT DETECTED Final   Enterobacterales NOT DETECTED NOT DETECTED Final   Enterobacter cloacae complex NOT DETECTED NOT DETECTED Final   Escherichia coli NOT DETECTED NOT DETECTED Final   Klebsiella aerogenes NOT DETECTED NOT DETECTED Final   Klebsiella oxytoca NOT DETECTED NOT DETECTED Final   Klebsiella pneumoniae NOT DETECTED NOT DETECTED Final   Proteus species NOT DETECTED NOT DETECTED Final   Salmonella species NOT DETECTED NOT DETECTED Final   Serratia marcescens NOT DETECTED NOT DETECTED Final   Haemophilus influenzae NOT DETECTED NOT DETECTED Final   Neisseria meningitidis NOT DETECTED NOT DETECTED Final   Pseudomonas aeruginosa NOT DETECTED NOT DETECTED Final   Stenotrophomonas maltophilia NOT DETECTED NOT DETECTED Final   Candida albicans NOT DETECTED NOT DETECTED Final   Candida auris NOT DETECTED NOT DETECTED Final   Candida glabrata NOT DETECTED NOT DETECTED Final   Candida krusei NOT DETECTED NOT DETECTED Final   Candida parapsilosis NOT DETECTED NOT DETECTED Final   Candida tropicalis NOT DETECTED NOT DETECTED Final   Cryptococcus neoformans/gattii NOT DETECTED NOT DETECTED Final    Comment: Performed at [facility], [address] [place], [place] [address]      Radiology Studies: DG Abd 1 View Result Date:  06/10/2024 CLINICAL DATA:  [id number].  Intractable nausea and vomiting. EXAM: ABDOMEN - 1 VIEW COMPARISON:  None Available. FINDINGS: The bowel gas pattern is nonobstructive  mild-to-moderate fecal stasis. No radio-opaque calculi or other significant radiographic abnormality are seen. There is degenerative change of the lumbar spine. IMPRESSION: Nonobstructive bowel gas pattern with mild-to-moderate fecal stasis. Electronically Signed   By: Francis Quam M.D.   On: 06/10/2024 05:44   CT Head Wo Contrast Result Date: 06/10/2024 CLINICAL DATA:  Headache, new onset (Age >= 51y) headache, vomiting EXAM: CT HEAD WITHOUT CONTRAST TECHNIQUE: Contiguous axial images were obtained from the base of the skull through the vertex without intravenous contrast. RADIATION DOSE REDUCTION: This exam was performed according to the departmental dose-optimization program which includes automated exposure control, adjustment of the mA and/or kV according to patient size and/or use of iterative reconstruction technique. COMPARISON:  CT head 08/25/2023 FINDINGS: Brain: No evidence of large-territorial acute infarction. No parenchymal hemorrhage. No mass lesion. No extra-axial collection. No mass effect or midline shift. No hydrocephalus. Basilar cisterns are patent. 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 Vascular: No hyperdense vessel. Skull: No acute fracture or focal lesion. Sinuses/Orbits: Paranasal sinuses and mastoid air cells are clear. The orbits are unremarkable. Other: None. IMPRESSION: 1. No acute intracranial abnormality. 2. 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 Electronically Signed   By: Morgane  Naveau M.D.   On: 06/10/2024 03:09   DG Chest Port 1 View Result Date: 06/10/2024 CLINICAL DATA:  Vomiting an aspiration risk, headache, nausea EXAM: PORTABLE CHEST 1 VIEW COMPARISON:  04/09/2024 FINDINGS: Stable enlargement of the cardiomediastinal silhouette. Airspace and interstitial opacities in the left  greater than right lung bases. No definite pleural effusion. No pneumothorax. IMPRESSION: Airspace and interstitial opacities in the left greater than right lung bases may be due to aspiration or pneumonia. Electronically Signed   By: Norman Gatlin M.D.   On: 06/10/2024 02:17      Scheduled Meds:  aspirin  EC  81 mg Oral Q lunch   insulin  aspart  0-6 Units Subcutaneous TID WC   levETIRAcetam   500 mg Oral Q lunch   sodium chloride  flush  3 mL Intravenous Q12H   verapamil   180 mg Oral Q lunch   Continuous Infusions:  ampicillin -sulbactam (UNASYN ) IV Stopped (06/11/24 0544)     LOS: 1 day   Time spent: 45 minutes   Delon Hoe, DO [facility] 06/11/2024, 1:25 PM   Available via Epic secure chat 7am-7pm After these hours, please refer to coverage provider listed on [email or web address]

## 2024-06-11 NOTE — Progress Notes (Signed)
 EEG complete - results pending

## 2024-06-11 NOTE — Consult Note (Signed)
 [facility]    Date of Admission:  06/10/2024   Total days of inpatient antibiotics 2        Reason for Consult: Strep viridans bacteremia    Principal Problem:   Aspiration pneumonia (HCC) Active Problems:   Essential hypertension   Acute metabolic encephalopathy   Seizure (HCC)   Obesity   Assessment: 83 year old female past medical history of dementia, seizure, heart failure with preserved ejection fraction, hypertension, venous insuff, OSA, aortic ectasia headache, nausea vomiting.  His aspiration with EMS present.  Admitted for aspiration pneunitis found to have: #Strep species bacteremia with concern for emboli to versus aspiration pneumonitis 2/2 possible cellulitis  - Blood cultures grew 1 out of 4 bottles strep species, nonhemolytic and 1 out of 4 bottles viridans Streptococcus. - CT head showed no acute intracranial abnormality. - Chest x-ray showed airspace and interstitial opacities in the left greater than right lung bases may be due to aspiration or pneumonia - Concern for meningitis was low, no fever or neck stiffness.  SLP eval pending aspiration pneumonitis. -Spoke with care taker, she notes pt has had erythema in panus/groin and under breast which has improved after nystatin  powder. She does have intertrigo. I suspect she may have had a superimposed cellulitis leading to bacteremia. Also, notes b/l UE petechial rash a week or so ago which has resolved. No neck stiffness. HA has resolved. Does not seem c/w meningitis. Recommendations:  -follow sens and further identification form blood Cx - d/c Unasyn  -start ceftriaxone   - Repeat blood cultures - TTE, will need tee -Follow-up MRI brain -sptum cx  Evaluation of this patient requires complex antimicrobial therapy evaluation and counseling + isolation needs for disease transmission risk assessment and mitigation    Microbiology:   Antibiotics: Dorene  8/7-  Cultures: Blood 8/8 blood cultures strep species 1 out of 4 bottles strep species, nonhemolytic and 1 out of 4 bottles viridans Streptococcus Urine  Other   HPI: Carol Martin is a 83 year old female past medical history of dementia, seizure, heart failure with preserved ejection fraction, hypertension, venous insuff, OSA, aortic ectasia headache, nausea vomiting. Noted to help mL aspiration of the mL.  Admitted for acute respiratory failure secondary to aspiration pneumonitis versus pneumonia.  Started on nystatin  and azithromycin .  ID engaged as blood cultures grew strep species.   Review of Systems: Review of Systems  All other systems reviewed and are negative.   Past Medical History:  Diagnosis Date   Abnormal EKG    Allergic rhinitis    Alzheimer's disease (HCC) 05/17/07   Arthritis of knee    Breast cancer (HCC)    Diastolic dysfunction    Dilated aortic root (HCC)    GERD (gastroesophageal reflux disease)    Hypertension    Kidney stones    Mild aortic insufficiency    Morbid obesity (HCC)    Sleep apnea    with insomnia   Venous (peripheral) insufficiency     Social History   Tobacco Use   Smoking status: Never   Smokeless tobacco: Never  Vaping Use   Vaping status: Never Used  Substance Use Topics   Alcohol use: No   Drug use: No    Family History  Problem Relation Age of Onset   Hypertension Mother    Hypertension Sister    Cancer Sister        Unknown cancer   Scheduled Meds:  aspirin  EC  81 mg Oral Q lunch   insulin  aspart  0-6 Units Subcutaneous TID WC   levETIRAcetam   500 mg Oral Q lunch   sodium chloride  flush  3 mL Intravenous Q12H   verapamil   180 mg Oral Q lunch   Continuous Infusions:  ampicillin -sulbactam (UNASYN ) IV Stopped (06/11/24 0544)   PRN Meds:.acetaminophen , albuterol , hydrOXYzine , melatonin, ondansetron  (ZOFRAN ) IV, polyethylene glycol, prochlorperazine , traMADol  No Known Allergies  OBJECTIVE: Blood pressure  139/88, pulse 67, temperature 98 F (36.7 C), temperature source Oral, resp. rate (!) 25, height 5' 3 (1.6 m), weight 134 kg, SpO2 90%.  Physical Exam Constitutional:      Appearance: Normal appearance.  HENT:     Head: Normocephalic and atraumatic.     Right Ear: Tympanic membrane normal.     Left Ear: Tympanic membrane normal.     Nose: Nose normal.     Mouth/Throat:     Mouth: Mucous membranes are moist.  Eyes:     Extraocular Movements: Extraocular movements intact.     Conjunctiva/sclera: Conjunctivae normal.     Pupils: Pupils are equal, round, and reactive to light.  Cardiovascular:     Rate and Rhythm: Normal rate and regular rhythm.     Heart sounds: No murmur heard.    No friction rub. No gallop.  Pulmonary:     Effort: Pulmonary effort is normal.     Breath sounds: Normal breath sounds.  Abdominal:     General: Abdomen is flat.     Palpations: Abdomen is soft.  Musculoskeletal:        General: Normal range of motion.  Skin:    General: Skin is warm and dry.  Neurological:     General: No focal deficit present.     Mental Status: She is alert and oriented to person, place, and time.     Comments: Erythema under panus  Psychiatric:        Mood and Affect: Mood normal.     Lab Results Lab Results  Component Value Date   WBC 12.0 (H) 06/11/2024   HGB 13.4 06/11/2024   HCT 41.3 06/11/2024   MCV 97.6 06/11/2024   PLT 150 06/11/2024    Lab Results  Component Value Date   CREATININE 0.81 06/11/2024   BUN 11 06/11/2024   NA 142 06/11/2024   K 3.8 06/11/2024   CL 107 06/11/2024   CO2 24 06/11/2024    Lab Results  Component Value Date   ALT 10 06/10/2024   AST 23 06/10/2024   ALKPHOS 71 06/10/2024   BILITOT 1.1 06/10/2024       Loney Stank, MD [facility] 06/11/2024, 2:16 PM Evaluation of this patient requires complex antimicrobial therapy evaluation and counseling + isolation needs for  disease transmission risk assessment and mitigation

## 2024-06-11 NOTE — Procedures (Signed)
 Patient Name: Carol Martin  MRN: [id number]  Epilepsy Attending: Arlin MALVA Krebs  Referring Physician/Provider: Rojelio Nest, DO  Date: 06/11/2024 Duration: 22.30 mins  Patient history: 83yo F with ams. EEG to evaluate for seizure.  Level of alertness: Awake  AEDs during EEG study: LEV  Technical aspects: This EEG study was done with scalp electrodes positioned according to the 10-20 International system of electrode placement. Electrical activity was reviewed with band pass filter of 1-70Hz , sensitivity of 7 uV/mm, display speed of 52mm/sec with a 60Hz  notched filter applied as appropriate. EEG data were recorded continuously and digitally stored.  Video monitoring was available and reviewed as appropriate.  Description: The posterior dominant rhythm consists of 8Hz  activity of moderate voltage (25-35 uV) seen predominantly in posterior head regions, symmetric and reactive to eye opening and eye closing. EEG showed intermittent generalized polymorphic sharply contoured 3 to 6 Hz theta-delta slowing. Hyperventilation and photic stimulation were not performed.     ABNORMALITY - Intermittent slow, generalized  IMPRESSION: This study is suggestive of mild diffuse encephalopathy. No seizures or definite epileptiform discharges were seen throughout the recording.  Mayson Mcneish O Chantalle Defilippo

## 2024-06-11 NOTE — Evaluation (Signed)
 Clinical/Bedside Swallow Evaluation Patient Details  Name: Carol Martin MRN: [id number] Date of Birth: Sep 19, 1941  Today's Date: 06/11/2024 Time: SLP Start Time (ACUTE ONLY): 1330 SLP Stop Time (ACUTE ONLY): 1345 SLP Time Calculation (min) (ACUTE ONLY): 15 min  Past Medical History:  Past Medical History:  Diagnosis Date   Abnormal EKG    Allergic rhinitis    Alzheimer's disease (HCC) 05/17/07   Arthritis of knee    Breast cancer (HCC)    Diastolic dysfunction    Dilated aortic root (HCC)    GERD (gastroesophageal reflux disease)    Hypertension    Kidney stones    Mild aortic insufficiency    Morbid obesity (HCC)    Sleep apnea    with insomnia   Venous (peripheral) insufficiency    Past Surgical History:  Past Surgical History:  Procedure Laterality Date   CESAREAN SECTION     DILATION AND CURETTAGE OF UTERUS     ECTOPIC PREGNANCY SURGERY     KIDNEY STONE SURGERY     REMOVAL   MASTECTOMY     R breast    TRACHEOSTOMY     HPI:  Patient is an 83 y.o. female with PMH: GERD, dementia, seizure disorder, HFpEF, HTN, venous insufficiency, OSA, aortic ectasia, who was brought in with headache, nausea, vomiting.  She had witnessed aspiration by EMS and requiring O2 in the ED. CXR showed airspace and interstitial opacity left greater than right base questionable aspiration or pneumonia.    Assessment / Plan / Recommendation  Clinical Impression  Patient is not currently presenting with clinical s/s of dysphagia as per this bedside swallow evaluation. When SLP arrived into room, patient appeared a little drowsy but her alertness improved as session progressed. She endorsed history of some difficulty with swallowing that has been going on for a while but she was not able to elaborate. SLP suspects this is secondary to her GERD diagnosis and patient did indicate she takes medications for GERD. She was able to feed self gelatin and drink liquids via straw sips. Even with successive  straw sips, patient did not exhibit any overt s/s aspiration. Swallow initiation appeared timely. SLP is recommending to advance her diet as tolerated. From a swallowing standpoint, she could advance to regular solids, but she is edentulous and might benefit from advancing no higher than dysphagia 3 (mechanical soft) solids. No SLP f/u warranted at this time. SLP Visit Diagnosis: Dysphagia, unspecified (R13.10)    Aspiration Risk  Mild aspiration risk    Diet Recommendation Other (Comment) (advance as tolerated)    Liquid Administration via: Cup;Straw Supervision: Patient able to self feed Compensations: Slow rate;Small sips/bites Postural Changes: Seated upright at 90 degrees    Other  Recommendations Oral Care Recommendations: Oral care BID     Assistance Recommended at Discharge    Functional Status Assessment Patient has not had a recent decline in their functional status  Frequency and Duration   N/A         Prognosis   N/A     Swallow Study   General Date of Onset: 06/11/24 HPI: Patient is an 83 y.o. female with PMH: GERD, dementia, seizure disorder, HFpEF, HTN, venous insufficiency, OSA, aortic ectasia, who was brought in with headache, nausea, vomiting.  She had witnessed aspiration by EMS and requiring O2 in the ED. CXR showed airspace and interstitial opacity left greater than right base questionable aspiration or pneumonia. Type of Study: Bedside Swallow Evaluation Previous Swallow Assessment: none found Diet  Prior to this Study: Thin liquids (Level 0);Clear liquid diet Temperature Spikes Noted: No Respiratory Status: Room air History of Recent Intubation: No Behavior/Cognition: Alert;Cooperative;Pleasant mood Oral Cavity Assessment: Within Functional Limits Oral Care Completed by SLP: No Oral Cavity - Dentition: Edentulous Vision: Functional for self-feeding Self-Feeding Abilities: Able to feed self Patient Positioning: Upright in bed Baseline Vocal Quality:  Normal Volitional Cough: Strong Volitional Swallow: Able to elicit    Oral/Motor/Sensory Function Overall Oral Motor/Sensory Function: Within functional limits   Ice Chips     Thin Liquid Thin Liquid: Within functional limits Presentation: Self Fed;Straw    Nectar Thick     Honey Thick     Puree     Solid     Solid: Within functional limits Presentation: Self Fed Other Comments: gelatin      Norleen IVAR Blase, MA, CCC-SLP Speech Therapy

## 2024-06-11 NOTE — Progress Notes (Signed)
 MB called and spoke to Nurse. Pt is awaiting transport to go to MRI per nurse, and not available. We will try back as schedule permits.

## 2024-06-11 NOTE — Evaluation (Signed)
 Physical Therapy Evaluation Patient Details Name: Carol Martin MRN: [id number] DOB: 1941-07-07 Today's Date: 06/11/2024  History of Present Illness  Carol Martin is a 83 yo female who presented after HA/nausea/vomiting with aspiration. Admitted for aspiration pneumonia. Had two seizures overnight 06/10/24.  PMHx: dementia, seizure disorder, HFpEF, hypertension, venous insufficiency, OSA, aortic ectasia  Clinical Impression  Patient presents with decreased mobility due to generalized weakness, decreased cognition, decreased balance and unable to stand with +2 A after three attempts from EOB.  She normally walks with RW and daughter assist at home and has lift chair.  She needs A for most ADL's, though reports she still gets into the shower (though she is not most reliable historian).  Feel she will benefit from skilled PT in the acute setting and from post-acute inpatient rehab (<3 hours/day) prior to d/c home with daughter assist.         If plan is discharge home, recommend the following: Two people to help with bathing/dressing/bathroom;Two people to help with walking and/or transfers   Can travel by private vehicle   No    Equipment Recommendations None recommended by PT  Recommendations for Other Services       Functional Status Assessment Patient has had a recent decline in their functional status and demonstrates the ability to make significant improvements in function in a reasonable and predictable amount of time.     Precautions / Restrictions Precautions Precautions: Fall Recall of Precautions/Restrictions: Intact      Mobility  Bed Mobility Overal bed mobility: Needs Assistance Bed Mobility: Rolling, Sidelying to Sit, Sit to Supine Rolling: Mod assist Sidelying to sit: +2 for physical assistance, HOB elevated, Used rails, Max assist   Sit to supine: +2 for safety/equipment, Total assist   General bed mobility comments: up to EOB with A for legs off EOB and to lift  trunk; to supine assist for all aspects including scooting to [facility]    Transfers Overall transfer level: Needs assistance Equipment used: Rolling walker (2 wheels) Transfers: Sit to/from Stand Sit to Stand: From elevated surface, Max assist, +2 physical assistance           General transfer comment: attempted x 3 from EOB to RW Though despite lifting help and multiple trials pt unable to lift hips off EOB    Ambulation/Gait                  Stairs            Wheelchair Mobility     Tilt Bed    Modified Rankin (Stroke Patients Only)       Balance Overall balance assessment: Needs assistance Sitting-balance support: Feet supported Sitting balance-Leahy Scale: Fair                                       Pertinent Vitals/Pain Pain Assessment Pain Assessment: Faces Faces Pain Scale: No hurt    Home Living Family/patient expects to be discharged to:: Private residence Living Arrangements: Children Available Help at Discharge: Family;Available 24 hours/day Type of Home: Apartment Home Access: Level entry       Home Layout: One level Home Equipment: Rollator (4 wheels);Shower seat Additional Comments: lift chair    Prior Function Prior Level of Function : Patient poor historian/Family not available             Mobility Comments: States walks with RW and  daughter assist, denies falls, pt unreliable historian and daughter not available ADLs Comments: states showering with daughter's help with shower seat     Extremity/Trunk Assessment   Upper Extremity Assessment Upper Extremity Assessment: Defer to OT evaluation    Lower Extremity Assessment Lower Extremity Assessment: RLE deficits/detail;LLE deficits/detail RLE Deficits / Details: AAROM WFL, some edema present, strength hip flexion 2-/5, knee extension 4-/5 LLE Deficits / Details: AAROM WFL, some edema present, strength hip flexion 3-/5, knee extension 4/5    Cervical /  Trunk Assessment Cervical / Trunk Assessment: Other exceptions Cervical / Trunk Exceptions: increased body habitus  Communication   Communication Factors Affecting Communication: Difficulty expressing self (some mumbling and disjointed speech)    Cognition Arousal: Alert Behavior During Therapy: Flat affect   PT - Cognitive impairments: No family/caregiver present to determine baseline                       PT - Cognition Comments: oriented to place, not day or situation or year, knew month Following commands: Impaired Following commands impaired: Only follows one step commands consistently, Follows one step commands with increased time     Cueing       General Comments General comments (skin integrity, edema, etc.): VSS on RA    Exercises     Assessment/Plan    PT Assessment Patient needs continued PT services  PT Problem List Decreased strength;Decreased activity tolerance;Decreased balance;Decreased mobility;Decreased knowledge of precautions       PT Treatment Interventions DME instruction;Gait training;Functional mobility training;Therapeutic activities;Therapeutic exercise;Balance training    PT Goals (Current goals can be found in the Care Plan section)  Acute Rehab PT Goals Patient Stated Goal: get up to stand PT Goal Formulation: With patient Time For Goal Achievement: 06/25/24 Potential to Achieve Goals: Fair    Frequency Min 2X/week     Co-evaluation               AM-PAC PT 6 Clicks Mobility  Outcome Measure Help needed turning from your back to your side while in a flat bed without using bedrails?: A Lot Help needed moving from lying on your back to sitting on the side of a flat bed without using bedrails?: Total Help needed moving to and from a bed to a chair (including a wheelchair)?: Total Help needed standing up from a chair using your arms (e.g., wheelchair or bedside chair)?: Total Help needed to walk in hospital room?:  Total Help needed climbing 3-5 steps with a railing? : Total 6 Click Score: 7    End of Session Equipment Utilized During Treatment: Gait belt Activity Tolerance: Patient limited by fatigue Patient left: in bed;with call bell/phone within reach;with bed alarm set   PT Visit Diagnosis: Other abnormalities of gait and mobility (R26.89);Muscle weakness (generalized) (M62.81)    Time: 8889-8866 PT Time Calculation (min) (ACUTE ONLY): 23 min   Charges:   PT Evaluation $PT Eval Moderate Complexity: 1 Mod PT Treatments $Therapeutic Activity: 8-22 mins PT General Charges $$ ACUTE PT VISIT: 1 Visit         Micheline Portal, PT Acute Rehabilitation Services [phone] 06/11/2024   Montie Portal 06/11/2024, 2:59 PM

## 2024-06-12 ENCOUNTER — Inpatient Hospital Stay (HOSPITAL_COMMUNITY)

## 2024-06-12 DIAGNOSIS — I38 Endocarditis, valve unspecified: Secondary | ICD-10-CM

## 2024-06-12 DIAGNOSIS — J690 Pneumonitis due to inhalation of food and vomit: Secondary | ICD-10-CM | POA: Diagnosis not present

## 2024-06-12 LAB — BASIC METABOLIC PANEL WITH GFR
Anion gap: 9 (ref 5–15)
BUN: 12 mg/dL (ref 8–23)
CO2: 26 mmol/L (ref 22–32)
Calcium: 8.6 mg/dL — ABNORMAL LOW (ref 8.9–10.3)
Chloride: 103 mmol/L (ref 98–111)
Creatinine, Ser: 0.72 mg/dL (ref 0.44–1.00)
GFR, Estimated: >60 mL/min (ref >60)
Glucose, Bld: 105 mg/dL — ABNORMAL HIGH (ref 70–99)
Potassium: 4.4 mmol/L (ref 3.5–5.1)
Sodium: 138 mmol/L (ref 135–145)

## 2024-06-12 LAB — CULTURE, BLOOD (ROUTINE X 2): Special Requests: ADEQUATE

## 2024-06-12 LAB — CBC
HCT: 38.8 % (ref 36.0–46.0)
Hemoglobin: 12.8 g/dL (ref 12.0–15.0)
MCH: 31.7 pg (ref 26.0–34.0)
MCHC: 33 g/dL (ref 30.0–36.0)
MCV: 96 fL (ref 80.0–100.0)
Platelets: 149 K/uL — ABNORMAL LOW (ref 150–400)
RBC: 4.04 MIL/uL (ref 3.87–5.11)
RDW: 13 % (ref 11.5–15.5)
WBC: 9.3 K/uL (ref 4.0–10.5)
nRBC: 0 % (ref 0.0–0.2)

## 2024-06-12 LAB — ECHOCARDIOGRAM COMPLETE
Area-P 1/2: 5.34 cm2
S' Lateral: 3.1 cm

## 2024-06-12 MED ORDER — ENSURE PLUS HIGH PROTEIN PO LIQD
237.0000 mL | Freq: Two times a day (BID) | ORAL | Status: DC
  Administered 2024-06-12 – 2024-06-15 (×7): 237 mL via ORAL

## 2024-06-12 NOTE — Progress Notes (Addendum)
 PROGRESS NOTE    Carol Martin  FMW:[id number] DOB: [birth year] DOA: 06/10/2024 PCP: Shelda Atlas, MD     Brief Narrative:  Carol Martin is a 83 y.o. female with hx of dementia (mild), seizure disorder, HFpEF, hypertension, venous insufficiency, OSA, aortic ectasia, who was brought in with headache, nausea, vomiting.  She had witnessed aspiration by EMS and requiring O2 in the ED. Daughter reports that at her baseline she can be disoriented but is able to be alert and converse. She requires some assistance with transfers / some ADLS. But able to feed herself. She ambulates with the use of a walker.    Patient was in her usual state of health when she complained of headaches on Thursday evening.  Headaches were difficult to characterize, just described as pain.  EMS was called, and patient had witnessed vomiting followed by aspiration.  She was transferred to the hospital at that time.  Daughter states that headache has improved since being in the hospital.  This has been managed with Tylenol .  No documented fevers or subjective fever/chills.  Had some abdominal pain.  No further nausea or vomiting.  Denies any shortness of breath.  Has constipation at baseline.  Daughter states that patient's mentation is slowed and not quite back to her baseline. Patient was admitted with acute metabolic encephalopathy, nausea, vomiting and aspiration. Patient has had longstanding seizure disorder > 8 years to the daughter's recollection.  Patient however is noncompliant with her Keppra , and has been known to be skipping her doses.   ID consulted due to blood culture result +strep   New events last 24 hours / Subjective: Patient oriented to self only this morning. No new complaints. No family at bedside, I called daughter but no answer and voicemail box was full.   Assessment & Plan:   Principal Problem:   Aspiration pneumonia (HCC) Active Problems:   Essential hypertension   Acute metabolic  encephalopathy   Seizure (HCC)   Obesity   Acute metabolic encephalopathy  - Concern for meningitis is low, patient has no fevers, no neck stiffness.  She is able to follow all commands appropriately.  She is alert and oriented to self, city, month (but not to hospital) - At baseline, she has some forgetfulness but is conversant, independent of ADLs.  Uses walker for ambulation.  Lives at home with daughter. - Has seizure disorder and has been missing Keppra  dosing - MRI brain without acute intracranial abnormality - EEG without seizures or definitive epileptiform discharges  Aspiration pneumonitis - Witnessed aspiration of gastric contents by EMS - Empiric Unasyn  --> Rocephin   - SLP eval, patient can advance to regular solids, but started on dysphagia 3 as she is edentulous  Strep bacteremia - ID consulted  - Rocephin  - Echocardiogram  Seizure disorder - Keppra .  Medically noncompliant at home  Hypertension - Verapamil    Obesity - BMI 52.33    DVT prophylaxis:  SCDs Start: 06/10/24 0520  Code Status: Full code Family Communication: No family at bedside.  Called daughter, no answer Disposition Plan: Home Status is: Inpatient Remains inpatient appropriate because: Further workup    Antimicrobials:  Anti-infectives (From admission, onward)    Start     Dose/Rate Route Frequency Ordered Stop   06/11/24 1530  cefTRIAXone  (ROCEPHIN ) 2 g in sodium chloride  0.9 % 100 mL IVPB        2 g 200 mL/hr over 30 Minutes Intravenous Every 24 hours 06/11/24 1443     06/10/24 0600  Ampicillin -Sulbactam (UNASYN ) 3 g in sodium chloride  0.9 % 100 mL IVPB  Status:  Discontinued        3 g 200 mL/hr over 30 Minutes Intravenous Every 6 hours 06/10/24 0340 06/11/24 1443   06/10/24 0300  cefTRIAXone  (ROCEPHIN ) 1 g in sodium chloride  0.9 % 100 mL IVPB  Status:  Discontinued        1 g 200 mL/hr over 30 Minutes Intravenous  Once 06/10/24 0258 06/10/24 0340   06/10/24 0300  azithromycin   (ZITHROMAX ) 500 mg in sodium chloride  0.9 % 250 mL IVPB        500 mg 250 mL/hr over 60 Minutes Intravenous  Once 06/10/24 0258 06/10/24 0449        Objective: Vitals:   06/11/24 2229 06/11/24 2354 06/12/24 0400 06/12/24 0746  BP: 125/78 (!) 151/86 135/76 (!) 130/58  Pulse: 70 72 64   Resp: (!) 29 15 15 20   Temp: 98.8 F (37.1 C) 99.2 F (37.3 C)  98.2 F (36.8 C)  TempSrc: Axillary Axillary Oral Oral  SpO2:  96% 92% 92%  Weight:      Height:        Intake/Output Summary (Last 24 hours) at 06/12/2024 1028 Last data filed at 06/11/2024 1721 Gross per 24 hour  Intake 340.07 ml  Output --  Net 340.07 ml   Filed Weights   06/10/24 0142  Weight: 134 kg    Examination:  General exam: Appears calm and comfortable  Respiratory system: Clear to auscultation. Respiratory effort normal. No respiratory distress. No conversational dyspnea.  Cardiovascular system: S1 & S2 heard, RRR. No murmurs. No pedal edema. Gastrointestinal system: Abdomen is nondistended, soft and nontender. Normal bowel sounds heard. Central nervous system: Alert and oriented to self, city but not to hospital. Says she is home  Extremities: Symmetric in appearance  Skin: Dark skin intertrigo, but no acute cellulitic appearance under breast or pannus    Data Reviewed: I have personally reviewed following labs and imaging studies  CBC: Recent Labs  Lab 06/10/24 0155 06/10/24 1258 06/11/24 0611 06/12/24 0806  WBC 11.8* 17.9* 12.0* 9.3  NEUTROABS 10.0*  --   --   --   HGB 15.1* 14.9 13.4 12.8  HCT 46.5* 44.7 41.3 38.8  MCV 97.3 97.0 97.6 96.0  PLT 201 183 150 149*   Basic Metabolic Panel: Recent Labs  Lab 06/10/24 0155 06/10/24 1258 06/11/24 0611 06/12/24 0806  NA 135 137 142 138  K 3.8 4.1 3.8 4.4  CL 104 103 107 103  CO2 22 24 24 26   GLUCOSE 169* 126* 112* 105*  BUN 11 10 11 12   CREATININE 0.92 0.85 0.81 0.72  CALCIUM 8.6* 8.5* 8.7* 8.6*   GFR: Estimated Creatinine Clearance: 72.8  mL/min (by C-G formula based on SCr of 0.72 mg/dL). Liver Function Tests: Recent Labs  Lab 06/10/24 0155 06/10/24 1258  AST 27 23  ALT 11 10  ALKPHOS 79 71  BILITOT 1.3* 1.1  PROT 6.8 6.4*  ALBUMIN 3.5 3.0*   No results for input(s): LIPASE, AMYLASE in the last 168 hours. No results for input(s): AMMONIA in the last 168 hours. Coagulation Profile: No results for input(s): INR, PROTIME in the last 168 hours. Cardiac Enzymes: No results for input(s): CKTOTAL, CKMB, CKMBINDEX, TROPONINI in the last 168 hours. BNP (last 3 results) No results for input(s): PROBNP in the last 8760 hours. HbA1C: No results for input(s): HGBA1C in the last 72 hours. CBG: Recent Labs  Lab 06/10/24 2120  06/11/24 0021 06/11/24 0740 06/11/24 1136 06/11/24 1659  GLUCAP 123* 120* 110* 119* 117*   Lipid Profile: No results for input(s): CHOL, HDL, LDLCALC, TRIG, CHOLHDL, LDLDIRECT in the last 72 hours. Thyroid  Function Tests: No results for input(s): TSH, T4TOTAL, FREET4, T3FREE, THYROIDAB in the last 72 hours. Anemia Panel: No results for input(s): VITAMINB12, FOLATE, FERRITIN, TIBC, IRON, RETICCTPCT in the last 72 hours. Sepsis Labs: Recent Labs  Lab 06/11/24 9388  PROCALCITON 2.64    Recent Results (from the past 240 hours)  Resp panel by RT-PCR (RSV, Flu A&B, Covid) Anterior Nasal Swab     Status: None   Collection Time: 06/10/24  1:55 AM   Specimen: Anterior Nasal Swab  Result Value Ref Range Status   SARS Coronavirus 2 by RT PCR NEGATIVE NEGATIVE Final   Influenza A by PCR NEGATIVE NEGATIVE Final   Influenza B by PCR NEGATIVE NEGATIVE Final    Comment: (NOTE) The Xpert Xpress SARS-CoV-2/FLU/RSV plus assay is intended as an aid in the diagnosis of influenza from Nasopharyngeal swab specimens and should not be used as a sole basis for treatment. Nasal washings and aspirates are unacceptable for Xpert Xpress  SARS-CoV-2/FLU/RSV testing.  Fact Sheet for Patients: BloggerCourse.com  Fact Sheet for Healthcare Providers: SeriousBroker.it  This test is not yet approved or cleared by the [place]  FDA and has been authorized for detection and/or diagnosis of SARS-CoV-2 by FDA under an Emergency Use Authorization (EUA). This EUA will remain in effect (meaning this test can be used) for the duration of the COVID-19 declaration under Section 564(b)(1) of the Act, 21 U.S.C. section 360bbb-3(b)(1), unless the authorization is terminated or revoked.     Resp Syncytial Virus by PCR NEGATIVE NEGATIVE Final    Comment: (NOTE) Fact Sheet for Patients: BloggerCourse.com  Fact Sheet for Healthcare Providers: SeriousBroker.it  This test is not yet approved or cleared by the [place]  FDA and has been authorized for detection and/or diagnosis of SARS-CoV-2 by FDA under an Emergency Use Authorization (EUA). This EUA will remain in effect (meaning this test can be used) for the duration of the COVID-19 declaration under Section 564(b)(1) of the Act, 21 U.S.C. section 360bbb-3(b)(1), unless the authorization is terminated or revoked.  Performed at [facility], [address], [place], [place] [address]   Respiratory (~20 pathogens) panel by PCR     Status: None   Collection Time: 06/10/24  1:55 AM   Specimen: Nasopharyngeal Swab; Respiratory  Result Value Ref Range Status   Adenovirus NOT DETECTED NOT DETECTED Final   Coronavirus 229E NOT DETECTED NOT DETECTED Final    Comment: (NOTE) The Coronavirus on the Respiratory Panel, DOES NOT test for the novel  Coronavirus (2019 nCoV)    Coronavirus HKU1 NOT DETECTED NOT DETECTED Final   Coronavirus NL63 NOT DETECTED NOT DETECTED Final   Coronavirus OC43 NOT DETECTED NOT DETECTED Final   Metapneumovirus NOT DETECTED NOT DETECTED Final    Rhinovirus / Enterovirus NOT DETECTED NOT DETECTED Final   Influenza A NOT DETECTED NOT DETECTED Final   Influenza B NOT DETECTED NOT DETECTED Final   Parainfluenza Virus 1 NOT DETECTED NOT DETECTED Final   Parainfluenza Virus 2 NOT DETECTED NOT DETECTED Final   Parainfluenza Virus 3 NOT DETECTED NOT DETECTED Final   Parainfluenza Virus 4 NOT DETECTED NOT DETECTED Final   Respiratory Syncytial Virus NOT DETECTED NOT DETECTED Final   Bordetella pertussis NOT DETECTED NOT DETECTED Final   Bordetella Parapertussis NOT DETECTED NOT DETECTED  Final   Chlamydophila pneumoniae NOT DETECTED NOT DETECTED Final   Mycoplasma pneumoniae NOT DETECTED NOT DETECTED Final    Comment: Performed at [facility], [address], [place], [place] [address]  Blood culture (routine x 2)     Status: Abnormal   Collection Time: 06/10/24  3:34 AM   Specimen: BLOOD RIGHT HAND  Result Value Ref Range Status   Specimen Description BLOOD RIGHT HAND  Final   Special Requests   Final    BOTTLES DRAWN AEROBIC AND ANAEROBIC Blood Culture adequate volume   Culture  Setup Time   Final    GRAM POSITIVE COCCI IN CHAINS AEROBIC BOTTLE ONLY CRITICAL VALUE NOTED.  VALUE IS CONSISTENT WITH PREVIOUSLY REPORTED AND CALLED VALUE. Performed at [facility], [address] [place], [place] [address]    Culture STREPTOCOCCUS PARASANGUINIS (A)  Final   Report Status 06/12/2024 FINAL  Final   Organism ID, Bacteria STREPTOCOCCUS PARASANGUINIS  Final      Susceptibility   Streptococcus parasanguinis - MIC*    PENICILLIN <=0.06 SENSITIVE Sensitive     CEFTRIAXONE  <=0.12 SENSITIVE Sensitive     LEVOFLOXACIN 2 SENSITIVE Sensitive     VANCOMYCIN  0.5 SENSITIVE Sensitive     * STREPTOCOCCUS PARASANGUINIS  Blood culture (routine x 2)     Status: Abnormal (Preliminary result)   Collection Time: 06/10/24  3:34 AM   Specimen: BLOOD  Result Value Ref Range Status   Specimen Description BLOOD RIGHT ANTECUBITAL  Final    Special Requests   Final    BOTTLES DRAWN AEROBIC AND ANAEROBIC Blood Culture adequate volume   Culture  Setup Time   Final    GRAM POSITIVE COCCI IN PAIRS IN CHAINS ANAEROBIC BOTTLE ONLY CRITICAL RESULT CALLED TO, READ BACK BY AND VERIFIED WITH: MAYA PRENTICE POISSON [id number] AT 2036 BY EC    Culture (A)  Final    STREPTOCOCCUS SPECIES NON HEMOLYTIC IDENTIFICATION AND SUSCEPTIBILITIES TO FOLLOW REPEATING Performed at [facility], [address], [place], [place] [address]    Report Status PENDING  Incomplete  Blood Culture ID Panel (Reflexed)     Status: Abnormal   Collection Time: 06/10/24  3:34 AM  Result Value Ref Range Status   Enterococcus faecalis NOT DETECTED NOT DETECTED Final   Enterococcus Faecium NOT DETECTED NOT DETECTED Final   Listeria monocytogenes NOT DETECTED NOT DETECTED Final   Staphylococcus species NOT DETECTED NOT DETECTED Final   Staphylococcus aureus (BCID) NOT DETECTED NOT DETECTED Final   Staphylococcus epidermidis NOT DETECTED NOT DETECTED Final   Staphylococcus lugdunensis NOT DETECTED NOT DETECTED Final   Streptococcus species DETECTED (A) NOT DETECTED Final    Comment: Not Enterococcus species, Streptococcus agalactiae, Streptococcus pyogenes, or Streptococcus pneumoniae. CRITICAL RESULT CALLED TO, READ BACK BY AND VERIFIED WITH: MAYA PRENTICE POISSON [id number] AT 2034 BY EC    Streptococcus agalactiae NOT DETECTED NOT DETECTED Final   Streptococcus pneumoniae NOT DETECTED NOT DETECTED Final   Streptococcus pyogenes NOT DETECTED NOT DETECTED Final   A.calcoaceticus-baumannii NOT DETECTED NOT DETECTED Final   Bacteroides fragilis NOT DETECTED NOT DETECTED Final   Enterobacterales NOT DETECTED NOT DETECTED Final   Enterobacter cloacae complex NOT DETECTED NOT DETECTED Final   Escherichia coli NOT DETECTED NOT DETECTED Final   Klebsiella aerogenes NOT DETECTED NOT DETECTED Final   Klebsiella oxytoca NOT DETECTED NOT DETECTED Final   Klebsiella  pneumoniae NOT DETECTED NOT DETECTED Final   Proteus species NOT DETECTED NOT DETECTED Final  Salmonella species NOT DETECTED NOT DETECTED Final   Serratia marcescens NOT DETECTED NOT DETECTED Final   Haemophilus influenzae NOT DETECTED NOT DETECTED Final   Neisseria meningitidis NOT DETECTED NOT DETECTED Final   Pseudomonas aeruginosa NOT DETECTED NOT DETECTED Final   Stenotrophomonas maltophilia NOT DETECTED NOT DETECTED Final   Candida albicans NOT DETECTED NOT DETECTED Final   Candida auris NOT DETECTED NOT DETECTED Final   Candida glabrata NOT DETECTED NOT DETECTED Final   Candida krusei NOT DETECTED NOT DETECTED Final   Candida parapsilosis NOT DETECTED NOT DETECTED Final   Candida tropicalis NOT DETECTED NOT DETECTED Final   Cryptococcus neoformans/gattii NOT DETECTED NOT DETECTED Final    Comment: Performed at [facility], [address], [place], [place] [address]      Radiology Studies: MR BRAIN WO CONTRAST Result Date: 06/11/2024 CLINICAL DATA:  Initial evaluation for new onset headache. Mental status change. EXAM: MRI HEAD WITHOUT CONTRAST TECHNIQUE: Multiplanar, multiecho pulse sequences of the brain and surrounding structures were obtained without intravenous contrast. COMPARISON:  CT from 06/10/2024. FINDINGS: Brain: Cerebral volume overall within normal limits for age. Scattered patchy T2/FLAIR hyperintensity involving the periventricular and deep white matter both cerebral hemispheres, most characteristic of chronic microvascular ischemic disease, mild for age. No abnormal foci of restricted diffusion to suggest acute or subacute ischemia. No areas of chronic cortical infarction or other insult. No acute intracranial hemorrhage. Few scattered chronic micro hemorrhages noted, most notably about the thalami, most characteristic of likely hypertensive in nature. No mass lesion, midline shift or mass effect. No hydrocephalus or extra-axial fluid collection. Markedly expanded  and empty sella noted, stable as compared to previous MRI from 2015. Vascular: Major intracranial vascular flow voids are maintained. Skull and upper cervical spine: Craniocervical junction within normal limits. Reversal of the normal upper cervical lordosis noted. Bone marrow signal intensity within normal limits. No scalp soft tissue abnormality. Sinuses/Orbits: Globes orbital soft tissues within normal limits. Paranasal sinuses are largely clear. Moderate to large left greater than right mastoid effusions, of uncertain significance. Image nasopharynx unremarkable. Other: None. IMPRESSION: 1. No acute intracranial abnormality. 2. Mild chronic microvascular ischemic disease for age. 3. Markedly expanded and empty sella, stable as compared to previous MRI from 2015. 4. Moderate to large left greater than right mastoid effusions, of uncertain significance. Correlation with physical exam recommended. Electronically Signed   By: Morene Hoard M.D.   On: 06/11/2024 18:41   EEG adult Result Date: 06/11/2024 Shelton Arlin KIDD, MD     06/11/2024  5:21 PM Patient Name: SHELLENE SWEIGERT MRN: [id number] Epilepsy Attending: Arlin KIDD Shelton Referring Physician/Provider: Rojelio Nest, DO Date: 06/11/2024 Duration: 22.30 mins Patient history: 83yo F with ams. EEG to evaluate for seizure. Level of alertness: Awake AEDs during EEG study: LEV Technical aspects: This EEG study was done with scalp electrodes positioned according to the 10-20 International system of electrode placement. Electrical activity was reviewed with band pass filter of 1-70Hz , sensitivity of 7 uV/mm, display speed of 40mm/sec with a 60Hz  notched filter applied as appropriate. EEG data were recorded continuously and digitally stored.  Video monitoring was available and reviewed as appropriate. Description: The posterior dominant rhythm consists of 8Hz  activity of moderate voltage (25-35 uV) seen predominantly in posterior head regions, symmetric and  reactive to eye opening and eye closing. EEG showed intermittent generalized polymorphic sharply contoured 3 to 6 Hz theta-delta slowing. Hyperventilation and photic stimulation were not performed.   ABNORMALITY - Intermittent slow, generalized IMPRESSION:  This study is suggestive of mild diffuse encephalopathy. No seizures or definite epileptiform discharges were seen throughout the recording. Priyanka O Yadav      Scheduled Meds:  aspirin  EC  81 mg Oral Q lunch   feeding supplement  237 mL Oral BID BM   insulin  aspart  0-6 Units Subcutaneous TID WC   levETIRAcetam   500 mg Oral Q lunch   sodium chloride  flush  3 mL Intravenous Q12H   verapamil   180 mg Oral Q lunch   Continuous Infusions:  cefTRIAXone  (ROCEPHIN )  IV 2 g (06/11/24 1721)     LOS: 2 days   Time spent: 35 minutes   Delon Hoe, DO [facility] 06/12/2024, 10:28 AM   Available via Epic secure chat 7am-7pm After these hours, please refer to coverage provider listed on [email or web address]

## 2024-06-12 NOTE — Progress Notes (Incomplete)
         [facility]  Date of Admission:  06/10/2024   Total days of inpatient antibiotics ***  Principal Problem:   Aspiration pneumonia (HCC) Active Problems:   Essential hypertension   Acute metabolic encephalopathy   Seizure (HCC)   Obesity          Assessment: ***  Recommendations: ***   Microbiology:   Antibiotics:  Cultures: Blood  Urine  Other   SUBJECTIVE: *** Interval:   Review of Systems: ROS   Scheduled Meds:  aspirin  EC  81 mg Oral Q lunch   feeding supplement  237 mL Oral BID BM   levETIRAcetam   500 mg Oral Q lunch   sodium chloride  flush  3 mL Intravenous Q12H   verapamil   180 mg Oral Q lunch   Continuous Infusions:  cefTRIAXone  (ROCEPHIN )  IV 2 g (06/11/24 1721)   PRN Meds:.acetaminophen , albuterol , hydrOXYzine , melatonin, ondansetron  (ZOFRAN ) IV, polyethylene glycol, prochlorperazine , traMADol  No Known Allergies  OBJECTIVE: Vitals:   06/12/24 0400 06/12/24 0746 06/12/24 1128 06/12/24 1230  BP: 135/76 (!) 130/58 (!) 145/88 (!) 141/77  Pulse: 64  63   Resp: 15 20    Temp:  98.2 F (36.8 C) 97.9 F (36.6 C)   TempSrc: Oral Oral Oral   SpO2: 92% 92%    Weight:      Height:       Body mass index is 52.33 kg/m.  Physical Exam    Lab Results Lab Results  Component Value Date   WBC 9.3 06/12/2024   HGB 12.8 06/12/2024   HCT 38.8 06/12/2024   MCV 96.0 06/12/2024   PLT 149 (L) 06/12/2024    Lab Results  Component Value Date   CREATININE 0.72 06/12/2024   BUN 12 06/12/2024   NA 138 06/12/2024   K 4.4 06/12/2024   CL 103 06/12/2024   CO2 26 06/12/2024    Lab Results  Component Value Date   ALT 10 06/10/2024   AST 23 06/10/2024   ALKPHOS 71 06/10/2024   BILITOT 1.1 06/10/2024        Loney Stank, MD [facility] 06/12/2024, 1:34 PM

## 2024-06-12 NOTE — Plan of Care (Signed)

## 2024-06-13 DIAGNOSIS — B954 Other streptococcus as the cause of diseases classified elsewhere: Secondary | ICD-10-CM

## 2024-06-13 DIAGNOSIS — R7881 Bacteremia: Secondary | ICD-10-CM

## 2024-06-13 DIAGNOSIS — L03319 Cellulitis of trunk, unspecified: Secondary | ICD-10-CM

## 2024-06-13 DIAGNOSIS — J690 Pneumonitis due to inhalation of food and vomit: Secondary | ICD-10-CM | POA: Diagnosis not present

## 2024-06-13 NOTE — Progress Notes (Signed)
   06/13/24 1312  Spiritual Encounters  Type of Visit Follow up  Care provided to: Patient  Referral source Chaplain assessment  Reason for visit Routine spiritual support  OnCall Visit No  Spiritual Framework  Presenting Themes Impactful experiences and emotions;Rituals and practive  Community/Connection Faith community;Spiritual leader  Patient Stress Factors Health changes;Major life changes  Interventions  Spiritual Care Interventions Made Compassionate presence;Established relationship of care and support;Prayer  Intervention Outcomes  Outcomes Reduced anxiety;Reduced fear;Connection to spiritual care   Chaplain visited Pt, who was seated in her chair reading her bible. Pt shared that she was having difficulty seeing the words. Chaplain offered to read scripture aloud, an read Psalm 103 at the Pt's request.   Pt expressed deep gratitude and then request prayer. Chaplain prayer with the Pt, offering words of comfort and encouragement. Pt appeared spiritually uplifted following the visit.

## 2024-06-13 NOTE — Progress Notes (Signed)
   [facility] has been requested to perform a transesophageal echocardiogram on Carol Martin for bacteremia.    The patient does NOT have any absolute or relative contraindications to a Transesophageal Echocardiogram (TEE).  The patient has: Current Oxygen Requirement and History of Obstructive Sleep Apnea  After careful review of history and examination, the risks and benefits of transesophageal echocardiogram have been explained including risks of esophageal damage, perforation (1:10,000 risk), bleeding, pharyngeal hematoma as well as other potential complications associated with conscious sedation including aspiration, arrhythmia, respiratory failure and death. Alternatives to treatment were discussed, questions were answered. Discussed with the patient's daughter, Carol Martin, as the patient has baseline dementia. She was able to give consent on behalf of her mother. They are willing to proceed for now. Her daughter is planning on reaching out to their additional family members and will reach back out if anything changes with this plan.  Signed, Waddell DELENA Donath, PA-C  06/13/2024 12:42 PM

## 2024-06-13 NOTE — TOC Initial Note (Signed)
 Transition of Care [facility]) - Initial/Assessment Note    Patient Details  Name: Carol Martin MRN: [id number] Date of Birth: 03-Apr-1941  Transition of Care [facility]) CM/SW Contact:    Inocente GORMAN Kindle, LCSW Phone Number: 06/13/2024, 2:40 PM  Clinical Narrative:                 CSW received consult for SNF placement. CSW spoke with patient's daughter, [name]. She stated SNF is not an option. She declined need for Home Health services. Patient has hospital bed, hoyer, 3in1, and WC at home. She confirmed family will transport by car. Please let ICM team know if patient will require O2 at discharge but hopeful for weaning.   Expected Discharge Plan: Home/Self Care Barriers to Discharge: Continued Medical Work up   Patient Goals and CMS Choice Patient states their goals for this hospitalization and ongoing recovery are:: Return home CMS Medicare.gov Compare Post Acute Care list provided to:: Patient Represenative (must comment) Choice offered to / list presented to : Adult Children [place] ownership interest in [facility].provided to:: Adult Children    Expected Discharge Plan and Services In-house Referral: Clinical Social Work   Post Acute Care Choice: NA Living arrangements for the past 2 months: Single Family Home                                      Prior Living Arrangements/Services Living arrangements for the past 2 months: Single Family Home Lives with:: Adult Children Patient language and need for interpreter reviewed:: Yes Do you feel safe going back to the place where you live?: Yes      Need for Family Participation in Patient Care: Yes (Comment) Care giver support system in place?: Yes (comment) Current home services: DME (walker, 3in1, wc, hospital bed, hoyer) Criminal Activity/Legal Involvement Pertinent to Current Situation/Hospitalization: No - Comment as needed  Activities of Daily Living   ADL Screening (condition at time of  admission) Independently performs ADLs?: No Does the patient have a NEW difficulty with bathing/dressing/toileting/self-feeding that is expected to last >3 days?: Yes (Initiates electronic notice to provider for possible OT consult) Does the patient have a NEW difficulty with getting in/out of bed, walking, or climbing stairs that is expected to last >3 days?: Yes (Initiates electronic notice to provider for possible PT consult) Does the patient have a NEW difficulty with communication that is expected to last >3 days?: Yes (Initiates electronic notice to provider for possible SLP consult) Is the patient deaf or have difficulty hearing?: Yes Does the patient have difficulty seeing, even when wearing glasses/contacts?: No Does the patient have difficulty concentrating, remembering, or making decisions?: Yes  Permission Sought/Granted Permission sought to share information with : Facility Medical sales representative, Family Supports Permission granted to share information with : Yes, Verbal Permission Granted  Share Information with NAME: Rosanne, [place]- Daughter [phone] / [phone]           Emotional Assessment Appearance:: Appears stated age Attitude/Demeanor/Rapport: Unable to Assess Affect (typically observed): Unable to Assess Orientation: : Oriented to Self, Oriented to Place Alcohol / Substance Use: Not Applicable Psych Involvement: No (comment)  Admission diagnosis:  Aspiration pneumonia (HCC) [J69.0] Acute respiratory failure with hypoxia (HCC) [J96.01] Aspiration pneumonia of left upper lobe due to vomit [facility]) [J69.0] Patient Active Problem List   Diagnosis Date Noted   Acute metabolic encephalopathy 06/11/2024   Seizure (HCC) 06/11/2024  Obesity 06/11/2024   Aspiration pneumonia (HCC) 06/10/2024   Generalized weakness 04/06/2022   Allergic rhinitis 04/06/2022   Acute on chronic diastolic heart failure (HCC) 04/05/2022   COVID-19 06/23/2020   Chronic pain of both  knees 02/11/2018   Bilateral hip pain 02/11/2018   Unilateral primary osteoarthritis, left knee 02/11/2018   Unilateral primary osteoarthritis, right knee 02/11/2018   Tinea corporis 04/29/2016   Cellulitis, abdominal wall 04/29/2016   Dilated aortic root (HCC)    Chronic diastolic CHF (congestive heart failure) (HCC)    Mild aortic insufficiency    Sleep apnea    Breast cancer (HCC) 02/08/2013   Abnormal EKG 05/18/2011   Dyspnea 05/18/2011   Essential hypertension 05/18/2011   Healthcare maintenance 05/18/2011   Alzheimer's disease (HCC) 05/17/2007   PCP:  Shelda Atlas, MD Pharmacy:   [facility] [phone] - [place], [place] - [address] AT [address] [place] [address] Phone: [phone] Fax: [phone]  [facility] #[id number] [place], [place] - [address] AT [facility] OF [address] [place] [address] Phone: [phone] Fax: [phone]  [facility] DR[facility]ORE #[id number] [place], [place] - [address] AT [facility] OF [facility] & [address] [place] [address] Phone: [phone] Fax: [phone]     Social Drivers of Health (SDOH) Social History: SDOH Screenings   Food Insecurity: No Food Insecurity (06/10/2024)  Housing: Low Risk  (06/10/2024)  Transportation Needs: No Transportation Needs (06/10/2024)  Utilities: Not At Risk (06/10/2024)  Financial Resource Strain: Low Risk  (09/09/2020)   Received from [facility]  Social Connections: Moderately Integrated (06/10/2024)  Tobacco Use: Low Risk  (06/10/2024)   SDOH Interventions:     Readmission Risk Interventions     No data to display

## 2024-06-13 NOTE — Progress Notes (Signed)
 PROGRESS NOTE    Carol Martin  FMW:[id number] DOB: 1941-05-21 DOA: 06/10/2024 PCP: Shelda Atlas, MD     Brief Narrative:  Carol Martin is a 83 y.o. female with hx of dementia (mild), seizure disorder, HFpEF, hypertension, venous insufficiency, OSA, aortic ectasia, who was brought in with headache, nausea, vomiting.  She had witnessed aspiration by EMS and requiring O2 in the ED. Daughter reports that at her baseline she can be disoriented but is able to be alert and converse. She requires some assistance with transfers / some ADLS. But able to feed herself. She ambulates with the use of a walker.    Patient was in her usual state of health when she complained of headaches on Thursday evening.  Headaches were difficult to characterize, just described as pain.  EMS was called, and patient had witnessed vomiting followed by aspiration.  She was transferred to the hospital at that time.  Daughter states that headache has improved since being in the hospital.  This has been managed with Tylenol .  No documented fevers or subjective fever/chills.  Had some abdominal pain.  No further nausea or vomiting.  Denies any shortness of breath.  Has constipation at baseline.  Daughter states that patient's mentation is slowed and not quite back to her baseline. Patient was admitted with acute metabolic encephalopathy, nausea, vomiting and aspiration. Patient has had longstanding seizure disorder > 8 years to the daughter's recollection.  Patient however is noncompliant with her Keppra , and has been known to be skipping her doses.   ID consulted due to blood culture result +strep   New events last 24 hours / Subjective: Patient is hard of hearing, but is alert and oriented and conversational.  Her mentation seems much improved.  Granddaughter and daughter are at bedside.  Assessment & Plan:   Principal Problem:   Aspiration pneumonia (HCC) Active Problems:   Essential hypertension   Acute metabolic  encephalopathy   Seizure (HCC)   Obesity   Acute metabolic encephalopathy  - Concern for meningitis is low, patient has no fevers, no neck stiffness.  She is able to follow all commands appropriately.  She is alert and oriented to self, city, month (but not to hospital) - At baseline, she has some forgetfulness but is conversant, independent of ADLs.  Uses walker for ambulation.  Lives at home with daughter. - Has seizure disorder and has been missing Keppra  dosing - MRI brain without acute intracranial abnormality - EEG without seizures or definitive epileptiform discharges - Improved today   Aspiration pneumonitis - Witnessed aspiration of gastric contents by EMS - Empiric Unasyn  --> Rocephin   - SLP eval, patient can advance to regular solids, but started on dysphagia 3 as she is edentulous  Strep bacteremia - ID consulted  - Rocephin  - Echocardiogram EF 60 to 65%, grade 1 diastolic dysfunction, inadequate visualization of cardiac valves - Sent request to cardiology to arrange TEE this week   Seizure disorder - Keppra .  Medically noncompliant at home  Hypertension - Verapamil    Obesity - BMI 52.33    DVT prophylaxis:  SCDs Start: 06/10/24 0520  Code Status: Full code Family Communication: Discussed with daughter [name]  Disposition Plan: SNF  Status is: Inpatient Remains inpatient appropriate because: Further workup    Antimicrobials:  Anti-infectives (From admission, onward)    Start     Dose/Rate Route Frequency Ordered Stop   06/11/24 1530  cefTRIAXone  (ROCEPHIN ) 2 g in sodium chloride  0.9 % 100 mL IVPB  2 g 200 mL/hr over 30 Minutes Intravenous Every 24 hours 06/11/24 1443     06/10/24 0600  Ampicillin -Sulbactam (UNASYN ) 3 g in sodium chloride  0.9 % 100 mL IVPB  Status:  Discontinued        3 g 200 mL/hr over 30 Minutes Intravenous Every 6 hours 06/10/24 0340 06/11/24 1443   06/10/24 0300  cefTRIAXone  (ROCEPHIN ) 1 g in sodium chloride  0.9 % 100 mL  IVPB  Status:  Discontinued        1 g 200 mL/hr over 30 Minutes Intravenous  Once 06/10/24 0258 06/10/24 0340   06/10/24 0300  azithromycin  (ZITHROMAX ) 500 mg in sodium chloride  0.9 % 250 mL IVPB        500 mg 250 mL/hr over 60 Minutes Intravenous  Once 06/10/24 0258 06/10/24 0449        Objective: Vitals:   06/13/24 0000 06/13/24 0400 06/13/24 0733 06/13/24 1121  BP:   125/85 122/66  Pulse:   70   Resp:    20  Temp: 97.9 F (36.6 C) 98.3 F (36.8 C) 98.2 F (36.8 C) 98.5 F (36.9 C)  TempSrc: Oral Oral Axillary Oral  SpO2:      Weight:      Height:        Intake/Output Summary (Last 24 hours) at 06/13/2024 1148 Last data filed at 06/12/2024 2000 Gross per 24 hour  Intake --  Output 1250 ml  Net -1250 ml   Filed Weights   06/10/24 0142  Weight: 134 kg    Examination:  General exam: Appears calm and comfortable  ENT: Bilateral TM visualized. Right ear canal with irritation  Respiratory system: Clear to auscultation. Respiratory effort normal. No respiratory distress. No conversational dyspnea.  Cardiovascular system: S1 & S2 heard, RRR. No murmurs. No pedal edema. Gastrointestinal system: Abdomen is nondistended, soft and nontender. Normal bowel sounds heard. Central nervous system: Alert  Extremities: Symmetric in appearance   Data Reviewed: I have personally reviewed following labs and imaging studies  CBC: Recent Labs  Lab 06/10/24 0155 06/10/24 1258 06/11/24 0611 06/12/24 0806  WBC 11.8* 17.9* 12.0* 9.3  NEUTROABS 10.0*  --   --   --   HGB 15.1* 14.9 13.4 12.8  HCT 46.5* 44.7 41.3 38.8  MCV 97.3 97.0 97.6 96.0  PLT 201 183 150 149*   Basic Metabolic Panel: Recent Labs  Lab 06/10/24 0155 06/10/24 1258 06/11/24 0611 06/12/24 0806  NA 135 137 142 138  K 3.8 4.1 3.8 4.4  CL 104 103 107 103  CO2 22 24 24 26   GLUCOSE 169* 126* 112* 105*  BUN 11 10 11 12   CREATININE 0.92 0.85 0.81 0.72  CALCIUM 8.6* 8.5* 8.7* 8.6*   GFR: Estimated  Creatinine Clearance: 72.8 mL/min (by C-G formula based on SCr of 0.72 mg/dL). Liver Function Tests: Recent Labs  Lab 06/10/24 0155 06/10/24 1258  AST 27 23  ALT 11 10  ALKPHOS 79 71  BILITOT 1.3* 1.1  PROT 6.8 6.4*  ALBUMIN 3.5 3.0*   No results for input(s): LIPASE, AMYLASE in the last 168 hours. No results for input(s): AMMONIA in the last 168 hours. Coagulation Profile: No results for input(s): INR, PROTIME in the last 168 hours. Cardiac Enzymes: No results for input(s): CKTOTAL, CKMB, CKMBINDEX, TROPONINI in the last 168 hours. BNP (last 3 results) No results for input(s): PROBNP in the last 8760 hours. HbA1C: No results for input(s): HGBA1C in the last 72 hours. CBG: Recent Labs  Lab  06/10/24 2120 06/11/24 0021 06/11/24 0740 06/11/24 1136 06/11/24 1659  GLUCAP 123* 120* 110* 119* 117*   Lipid Profile: No results for input(s): CHOL, HDL, LDLCALC, TRIG, CHOLHDL, LDLDIRECT in the last 72 hours. Thyroid  Function Tests: No results for input(s): TSH, T4TOTAL, FREET4, T3FREE, THYROIDAB in the last 72 hours. Anemia Panel: No results for input(s): VITAMINB12, FOLATE, FERRITIN, TIBC, IRON, RETICCTPCT in the last 72 hours. Sepsis Labs: Recent Labs  Lab 06/11/24 9388  PROCALCITON 2.64    Recent Results (from the past 240 hours)  Resp panel by RT-PCR (RSV, Flu A&B, Covid) Anterior Nasal Swab     Status: None   Collection Time: 06/10/24  1:55 AM   Specimen: Anterior Nasal Swab  Result Value Ref Range Status   SARS Coronavirus 2 by RT PCR NEGATIVE NEGATIVE Final   Influenza A by PCR NEGATIVE NEGATIVE Final   Influenza B by PCR NEGATIVE NEGATIVE Final    Comment: (NOTE) The Xpert Xpress SARS-CoV-2/FLU/RSV plus assay is intended as an aid in the diagnosis of influenza from Nasopharyngeal swab specimens and should not be used as a sole basis for treatment. Nasal washings and aspirates are unacceptable for Xpert  Xpress SARS-CoV-2/FLU/RSV testing.  Fact Sheet for Patients: BloggerCourse.com  Fact Sheet for Healthcare Providers: SeriousBroker.it  This test is not yet approved or cleared by the [place]  FDA and has been authorized for detection and/or diagnosis of SARS-CoV-2 by FDA under an Emergency Use Authorization (EUA). This EUA will remain in effect (meaning this test can be used) for the duration of the COVID-19 declaration under Section 564(b)(1) of the Act, 21 U.S.C. section 360bbb-3(b)(1), unless the authorization is terminated or revoked.     Resp Syncytial Virus by PCR NEGATIVE NEGATIVE Final    Comment: (NOTE) Fact Sheet for Patients: BloggerCourse.com  Fact Sheet for Healthcare Providers: SeriousBroker.it  This test is not yet approved or cleared by the [place]  FDA and has been authorized for detection and/or diagnosis of SARS-CoV-2 by FDA under an Emergency Use Authorization (EUA). This EUA will remain in effect (meaning this test can be used) for the duration of the COVID-19 declaration under Section 564(b)(1) of the Act, 21 U.S.C. section 360bbb-3(b)(1), unless the authorization is terminated or revoked.  Performed at [facility], [address] [place], [place] [address]   Respiratory (~20 pathogens) panel by PCR     Status: None   Collection Time: 06/10/24  1:55 AM   Specimen: Nasopharyngeal Swab; Respiratory  Result Value Ref Range Status   Adenovirus NOT DETECTED NOT DETECTED Final   Coronavirus 229E NOT DETECTED NOT DETECTED Final    Comment: (NOTE) The Coronavirus on the Respiratory Panel, DOES NOT test for the novel  Coronavirus (2019 nCoV)    Coronavirus HKU1 NOT DETECTED NOT DETECTED Final   Coronavirus NL63 NOT DETECTED NOT DETECTED Final   Coronavirus OC43 NOT DETECTED NOT DETECTED Final   Metapneumovirus NOT DETECTED NOT DETECTED  Final   Rhinovirus / Enterovirus NOT DETECTED NOT DETECTED Final   Influenza A NOT DETECTED NOT DETECTED Final   Influenza B NOT DETECTED NOT DETECTED Final   Parainfluenza Virus 1 NOT DETECTED NOT DETECTED Final   Parainfluenza Virus 2 NOT DETECTED NOT DETECTED Final   Parainfluenza Virus 3 NOT DETECTED NOT DETECTED Final   Parainfluenza Virus 4 NOT DETECTED NOT DETECTED Final   Respiratory Syncytial Virus NOT DETECTED NOT DETECTED Final   Bordetella pertussis NOT DETECTED NOT DETECTED Final   Bordetella Parapertussis NOT DETECTED  NOT DETECTED Final   Chlamydophila pneumoniae NOT DETECTED NOT DETECTED Final   Mycoplasma pneumoniae NOT DETECTED NOT DETECTED Final    Comment: Performed at [facility], [address] [place], [place] [address]  Blood culture (routine x 2)     Status: Abnormal   Collection Time: 06/10/24  3:34 AM   Specimen: BLOOD RIGHT HAND  Result Value Ref Range Status   Specimen Description BLOOD RIGHT HAND  Final   Special Requests   Final    BOTTLES DRAWN AEROBIC AND ANAEROBIC Blood Culture adequate volume   Culture  Setup Time   Final    GRAM POSITIVE COCCI IN CHAINS AEROBIC BOTTLE ONLY CRITICAL VALUE NOTED.  VALUE IS CONSISTENT WITH PREVIOUSLY REPORTED AND CALLED VALUE. Performed at [facility], [address] [place], [place] [address]    Culture STREPTOCOCCUS PARASANGUINIS (A)  Final   Report Status 06/12/2024 FINAL  Final   Organism ID, Bacteria STREPTOCOCCUS PARASANGUINIS  Final      Susceptibility   Streptococcus parasanguinis - MIC*    PENICILLIN <=0.06 SENSITIVE Sensitive     CEFTRIAXONE  <=0.12 SENSITIVE Sensitive     LEVOFLOXACIN 2 SENSITIVE Sensitive     VANCOMYCIN  0.5 SENSITIVE Sensitive     * STREPTOCOCCUS PARASANGUINIS  Blood culture (routine x 2)     Status: Abnormal (Preliminary result)   Collection Time: 06/10/24  3:34 AM   Specimen: BLOOD  Result Value Ref Range Status   Specimen Description BLOOD RIGHT ANTECUBITAL  Final    Special Requests   Final    BOTTLES DRAWN AEROBIC AND ANAEROBIC Blood Culture adequate volume   Culture  Setup Time   Final    GRAM POSITIVE COCCI IN PAIRS IN CHAINS ANAEROBIC BOTTLE ONLY CRITICAL RESULT CALLED TO, READ BACK BY AND VERIFIED WITH: MAYA PRENTICE POISSON [id number] AT 2036 BY EC    Culture (A)  Final    STREPTOCOCCUS SPECIES NON HEMOLYTIC CULTURE REINCUBATED FOR BETTER GROWTH Performed at [facility], [address] [place], [place] [address]    Report Status PENDING  Incomplete  Blood Culture ID Panel (Reflexed)     Status: Abnormal   Collection Time: 06/10/24  3:34 AM  Result Value Ref Range Status   Enterococcus faecalis NOT DETECTED NOT DETECTED Final   Enterococcus Faecium NOT DETECTED NOT DETECTED Final   Listeria monocytogenes NOT DETECTED NOT DETECTED Final   Staphylococcus species NOT DETECTED NOT DETECTED Final   Staphylococcus aureus (BCID) NOT DETECTED NOT DETECTED Final   Staphylococcus epidermidis NOT DETECTED NOT DETECTED Final   Staphylococcus lugdunensis NOT DETECTED NOT DETECTED Final   Streptococcus species DETECTED (A) NOT DETECTED Final    Comment: Not Enterococcus species, Streptococcus agalactiae, Streptococcus pyogenes, or Streptococcus pneumoniae. CRITICAL RESULT CALLED TO, READ BACK BY AND VERIFIED WITH: MAYA PRENTICE POISSON [id number] AT 2034 BY EC    Streptococcus agalactiae NOT DETECTED NOT DETECTED Final   Streptococcus pneumoniae NOT DETECTED NOT DETECTED Final   Streptococcus pyogenes NOT DETECTED NOT DETECTED Final   A.calcoaceticus-baumannii NOT DETECTED NOT DETECTED Final   Bacteroides fragilis NOT DETECTED NOT DETECTED Final   Enterobacterales NOT DETECTED NOT DETECTED Final   Enterobacter cloacae complex NOT DETECTED NOT DETECTED Final   Escherichia coli NOT DETECTED NOT DETECTED Final   Klebsiella aerogenes NOT DETECTED NOT DETECTED Final   Klebsiella oxytoca NOT DETECTED NOT DETECTED Final   Klebsiella pneumoniae NOT  DETECTED NOT DETECTED Final   Proteus species NOT DETECTED NOT DETECTED  Final   Salmonella species NOT DETECTED NOT DETECTED Final   Serratia marcescens NOT DETECTED NOT DETECTED Final   Haemophilus influenzae NOT DETECTED NOT DETECTED Final   Neisseria meningitidis NOT DETECTED NOT DETECTED Final   Pseudomonas aeruginosa NOT DETECTED NOT DETECTED Final   Stenotrophomonas maltophilia NOT DETECTED NOT DETECTED Final   Candida albicans NOT DETECTED NOT DETECTED Final   Candida auris NOT DETECTED NOT DETECTED Final   Candida glabrata NOT DETECTED NOT DETECTED Final   Candida krusei NOT DETECTED NOT DETECTED Final   Candida parapsilosis NOT DETECTED NOT DETECTED Final   Candida tropicalis NOT DETECTED NOT DETECTED Final   Cryptococcus neoformans/gattii NOT DETECTED NOT DETECTED Final    Comment: Performed at [facility], [address], [place], [place] [address]  Culture, blood (Routine X 2) w Reflex to ID Panel     Status: None (Preliminary result)   Collection Time: 06/11/24  2:52 PM   Specimen: BLOOD LEFT ARM  Result Value Ref Range Status   Specimen Description BLOOD LEFT ARM  Final   Special Requests   Final    BOTTLES DRAWN AEROBIC AND ANAEROBIC Blood Culture results may not be optimal due to an inadequate volume of blood received in culture bottles   Culture   Final    NO GROWTH 2 DAYS Performed at [facility], [address] [place], [place] [address]    Report Status PENDING  Incomplete  Culture, blood (Routine X 2) w Reflex to ID Panel     Status: None (Preliminary result)   Collection Time: 06/11/24  2:56 PM   Specimen: BLOOD LEFT HAND  Result Value Ref Range Status   Specimen Description BLOOD LEFT HAND  Final   Special Requests   Final    BOTTLES DRAWN AEROBIC ONLY Blood Culture results may not be optimal due to an inadequate volume of blood received in culture bottles   Culture   Final    NO GROWTH 2 DAYS Performed at [facility], [address] [place], [place] [address]    Report Status PENDING  Incomplete      Radiology Studies: ECHOCARDIOGRAM COMPLETE Result Date: 06/12/2024    ECHOCARDIOGRAM REPORT   Patient Name:   VALBORG FRIAR Date of Exam: 06/12/2024 Medical Rec #:  [id number]       Height:       63.0 in Accession #:    [id number]      Weight:       295.4 lb Date of Birth:  1941-10-05        BSA:          2.282 m Patient Age:    83 years        BP:           141/77 mmHg Patient Gender: F               HR:           79 bpm. Exam Location:  Inpatient Procedure: 2D Echo, Cardiac Doppler and Color Doppler (Both Spectral and Color            Flow Doppler were utilized during procedure). Indications:    Endocarditis  History:        Patient has prior history of Echocardiogram examinations.                 Endocarditis; Risk Factors:Hypertension.  Sonographer:    Vella Key Referring Phys: [id number] MAYANKA  [facility]  Sonographer Comments: Technically difficult study due to poor echo windows, Technically challenging study due to limited acoustic windows, suboptimal parasternal window, suboptimal apical window, suboptimal subcostal window and patient is obese. Image acquisition challenging due to patient body habitus. IMPRESSIONS  1. Left ventricular ejection fraction, by estimation, is 60 to 65%. The left ventricle has normal function. Left ventricular endocardial border not optimally defined to evaluate regional wall motion. There is mild left ventricular hypertrophy. Left ventricular diastolic parameters are consistent with Grade I diastolic dysfunction (impaired relaxation). Elevated left ventricular end-diastolic pressure.  2. Right ventricular systolic function is not well visualized but appears to be normal. The right ventricular size is normal. Tricuspid regurgitation signal is inadequate for assessing PA pressure.  3. The mitral valve is grossly normal. No evidence of mitral valve regurgitation. No evidence of mitral stenosis.  4. The aortic valve  was not well visualized. Aortic valve regurgitation is mild. No aortic stenosis is present.  5. Aortic dilatation noted. There is borderline dilatation of the ascending aorta, measuring 39 mm. Conclusion(s)/Recommendation(s): Poor acoustic windows. Inadequate visualization of cardiac valves. Consider TEE, if clinically indicated, to r/o infective endocarditis. FINDINGS  Left Ventricle: Left ventricular ejection fraction, by estimation, is 60 to 65%. The left ventricle has normal function. Left ventricular endocardial border not optimally defined to evaluate regional wall motion. Strain was performed and the global longitudinal strain is indeterminate. The left ventricular internal cavity size was normal in size. There is mild left ventricular hypertrophy. Left ventricular diastolic parameters are consistent with Grade I diastolic dysfunction (impaired relaxation).  Elevated left ventricular end-diastolic pressure. Right Ventricle: The right ventricular size is normal. No increase in right ventricular wall thickness. Right ventricular systolic function is not well visualized but appears to be normal. Tricuspid regurgitation signal is inadequate for assessing PA pressure. Left Atrium: Left atrial size was normal in size. Right Atrium: Right atrial size was normal in size. Pericardium: There is no evidence of pericardial effusion. Mitral Valve: The mitral valve is grossly normal. No evidence of mitral valve regurgitation. No evidence of mitral valve stenosis. Tricuspid Valve: The tricuspid valve is not well visualized. Tricuspid valve regurgitation is not demonstrated. No evidence of tricuspid stenosis. Aortic Valve: The aortic valve was not well visualized. Aortic valve regurgitation is mild. No aortic stenosis is present. Pulmonic Valve: The pulmonic valve was not well visualized. Pulmonic valve regurgitation is trivial. No evidence of pulmonic stenosis. Aorta: The aortic root is normal in size and structure and  aortic dilatation noted. There is borderline dilatation of the ascending aorta, measuring 39 mm. Venous: The inferior vena cava was not well visualized. IAS/Shunts: No atrial level shunt detected by color flow Doppler. Additional Comments: 3D was performed not requiring image post processing on an independent workstation and was indeterminate.  LEFT VENTRICLE PLAX 2D LVIDd:         4.40 cm   Diastology LVIDs:         3.10 cm   LV e' medial:    5.66 cm/s LV PW:         1.20 cm   LV E/e' medial:  15.0 LV IVS:        1.20 cm   LV e' lateral:   4.79 cm/s LVOT diam:     1.80 cm   LV E/e' lateral: 17.7 LV SV:         51 LV SV Index:   23 LVOT Area:     2.54 cm  LEFT ATRIUM  Index LA diam:        3.20 cm 1.40 cm/m LA Vol (A2C):   84.8 ml 37.16 ml/m LA Vol (A4C):   43.2 ml 18.93 ml/m LA Biplane Vol: 64.5 ml 28.26 ml/m  AORTIC VALVE LVOT Vmax:   103.00 cm/s LVOT Vmean:  66.300 cm/s LVOT VTI:    0.202 m  AORTA Ao Root diam: 3.60 cm Ao Asc diam:  3.90 cm MITRAL VALVE MV Area (PHT): 5.34 cm    SHUNTS MV Decel Time: 142 msec    Systemic VTI:  0.20 m MV E velocity: 84.90 cm/s  Systemic Diam: 1.80 cm MV A velocity: 94.30 cm/s MV E/A ratio:  0.90 Vishnu Priya Mallipeddi Electronically signed by Diannah Late Mallipeddi Signature Date/Time: 06/12/2024/4:04:57 PM    Final    MR BRAIN WO CONTRAST Result Date: 06/11/2024 CLINICAL DATA:  Initial evaluation for new onset headache. Mental status change. EXAM: MRI HEAD WITHOUT CONTRAST TECHNIQUE: Multiplanar, multiecho pulse sequences of the brain and surrounding structures were obtained without intravenous contrast. COMPARISON:  CT from 06/10/2024. FINDINGS: Brain: Cerebral volume overall within normal limits for age. Scattered patchy T2/FLAIR hyperintensity involving the periventricular and deep white matter both cerebral hemispheres, most characteristic of chronic microvascular ischemic disease, mild for age. No abnormal foci of restricted diffusion to suggest acute or  subacute ischemia. No areas of chronic cortical infarction or other insult. No acute intracranial hemorrhage. Few scattered chronic micro hemorrhages noted, most notably about the thalami, most characteristic of likely hypertensive in nature. No mass lesion, midline shift or mass effect. No hydrocephalus or extra-axial fluid collection. Markedly expanded and empty sella noted, stable as compared to previous MRI from 2015. Vascular: Major intracranial vascular flow voids are maintained. Skull and upper cervical spine: Craniocervical junction within normal limits. Reversal of the normal upper cervical lordosis noted. Bone marrow signal intensity within normal limits. No scalp soft tissue abnormality. Sinuses/Orbits: Globes orbital soft tissues within normal limits. Paranasal sinuses are largely clear. Moderate to large left greater than right mastoid effusions, of uncertain significance. Image nasopharynx unremarkable. Other: None. IMPRESSION: 1. No acute intracranial abnormality. 2. Mild chronic microvascular ischemic disease for age. 3. Markedly expanded and empty sella, stable as compared to previous MRI from 2015. 4. Moderate to large left greater than right mastoid effusions, of uncertain significance. Correlation with physical exam recommended. Electronically Signed   By: Morene Hoard M.D.   On: 06/11/2024 18:41   EEG adult Result Date: 06/11/2024 Shelton Arlin KIDD, MD     06/11/2024  5:21 PM Patient Name: DELORUS LANGWELL MRN: [id number] Epilepsy Attending: Arlin KIDD Shelton Referring Physician/Provider: Rojelio Nest, DO Date: 06/11/2024 Duration: 22.30 mins Patient history: 83yo F with ams. EEG to evaluate for seizure. Level of alertness: Awake AEDs during EEG study: LEV Technical aspects: This EEG study was done with scalp electrodes positioned according to the 10-20 International system of electrode placement. Electrical activity was reviewed with band pass filter of 1-70Hz , sensitivity of 7 uV/mm,  display speed of 25mm/sec with a 60Hz  notched filter applied as appropriate. EEG data were recorded continuously and digitally stored.  Video monitoring was available and reviewed as appropriate. Description: The posterior dominant rhythm consists of 8Hz  activity of moderate voltage (25-35 uV) seen predominantly in posterior head regions, symmetric and reactive to eye opening and eye closing. EEG showed intermittent generalized polymorphic sharply contoured 3 to 6 Hz theta-delta slowing. Hyperventilation and photic stimulation were not performed.   ABNORMALITY - Intermittent slow, generalized IMPRESSION: This study  is suggestive of mild diffuse encephalopathy. No seizures or definite epileptiform discharges were seen throughout the recording. Priyanka O Yadav      Scheduled Meds:  aspirin  EC  81 mg Oral Q lunch   feeding supplement  237 mL Oral BID BM   levETIRAcetam   500 mg Oral Q lunch   sodium chloride  flush  3 mL Intravenous Q12H   verapamil   180 mg Oral Q lunch   Continuous Infusions:  cefTRIAXone  (ROCEPHIN )  IV 2 g (06/12/24 1911)     LOS: 3 days   Time spent: 25 minutes   Delon Hoe, DO [facility] 06/13/2024, 11:48 AM   Available via Epic secure chat 7am-7pm After these hours, please refer to coverage provider listed on [email or web address]

## 2024-06-13 NOTE — Progress Notes (Signed)
 Physical Therapy Treatment Patient Details Name: Carol Martin MRN: [id number] DOB: 09-01-1941 Today's Date: 06/13/2024   History of Present Illness Carol Martin is a 83 yo female who presented after HA/nausea/vomiting with aspiration. Admitted for aspiration pneumonia. Had two seizures overnight 06/10/24.  PMHx: dementia, seizure disorder, HFpEF, hypertension, venous insufficiency, OSA, aortic ectasia    PT Comments  Pt seen for PT tx with co-tx with OT. Pt is limited by [facility] but is pleasant & agreeable to participate. Pt requires cuing & assistance for bed mobility to exit on R side. Attempted sit>stand x 3 from EOB but pt unable to fully clear buttocks even with total assist +2. Pt was successful with lateral scoot bed>drop arm recliner on R with +2 assist. Pt would benefit from ongoing skilled PT treatment to progress mobility as able, to reduce fall risk & decrease caregiver burden.    If plan is discharge home, recommend the following: Two people to help with bathing/dressing/bathroom;Two people to help with walking and/or transfers   Can travel by private vehicle     No  Equipment Recommendations  Other (comment) (defer to next venue)    Recommendations for Other Services       Precautions / Restrictions Precautions Precautions: Fall Restrictions Weight Bearing Restrictions Per Provider Order: No     Mobility  Bed Mobility Overal bed mobility: Needs Assistance Bed Mobility: Supine to Sit   Sidelying to sit: Mod assist, HOB elevated, Used rails (to exit R side of bed; assistance to move BLE off EOB & initiate uprighting trunk)            Transfers Overall transfer level: Needs assistance   Transfers: Sit to/from Stand, Bed to chair/wheelchair/BSC Sit to Stand: From elevated surface, +2 physical assistance, Total assist          Lateral/Scoot Transfers: Mod assist, +2 physical assistance, +2 safety/equipment General transfer comment: Attempted sit>stand from  EOB x 3 with RW, pt electing to pull on RW vs pushing as she's accustomed to using rollator vs RW. Pt cued to push BLE feet down through floor. Pt requires total assist but unable to fully clear buttocks from EOB. Transitioned to lateral scoot to drop arm recliner on R with pt able to scoot with assistance to guid to recliner. Pt requires cuing re: head/hips relationship to increase ease of scooting back once in recliner.    Ambulation/Gait                   Stairs             Wheelchair Mobility     Tilt Bed    Modified Rankin (Stroke Patients Only)       Balance Overall balance assessment: Needs assistance Sitting-balance support: Feet supported, No upper extremity supported Sitting balance-Leahy Scale: Good                                      Communication Communication Communication: Impaired Factors Affecting Communication: Hearing impaired  Cognition Arousal: Alert Behavior During Therapy: WFL for tasks assessed/performed   PT - Cognitive impairments: Difficult to assess Difficult to assess due to: Hard of hearing/deaf                       Following commands: Impaired Following commands impaired: Follows one step commands with increased time    Cueing Cueing Techniques: Verbal cues,  Gestural cues, Visual cues, Tactile cues  Exercises      General Comments General comments (skin integrity, edema, etc.): Pt received on 2L/min, weaned to room air. SpO2 does drop to 87% with movement/exertional activity but does increase to >/= 90% at rest; pt left on room air & nurse made aware. Provided cuing/education re: pursed lip breathing for pt. Pt does c/o lightheadedness x 1 sitting EOB but reports this improves with pursed lip breathing.      Pertinent Vitals/Pain Pain Assessment Pain Assessment: Faces Faces Pain Scale: Hurts little more Pain Location: BLE knees Pain Descriptors / Indicators: Discomfort Pain Intervention(s):  Monitored during session, Limited activity within patient's tolerance    Home Living                          Prior Function            PT Goals (current goals can now be found in the care plan section) Acute Rehab PT Goals Patient Stated Goal: get up to stand PT Goal Formulation: With patient Time For Goal Achievement: 06/25/24 Potential to Achieve Goals: Fair Progress towards PT goals: Progressing toward goals    Frequency    Min 2X/week      PT Plan      Co-evaluation PT/OT/SLP Co-Evaluation/Treatment: Yes Reason for Co-Treatment: For patient/therapist safety;To address functional/ADL transfers PT goals addressed during session: Mobility/safety with mobility;Balance        AM-PAC PT 6 Clicks Mobility   Outcome Measure  Help needed turning from your back to your side while in a flat bed without using bedrails?: A Lot Help needed moving from lying on your back to sitting on the side of a flat bed without using bedrails?: A Lot Help needed moving to and from a bed to a chair (including a wheelchair)?: Total Help needed standing up from a chair using your arms (e.g., wheelchair or bedside chair)?: Total Help needed to walk in hospital room?: Total Help needed climbing 3-5 steps with a railing? : Total 6 Click Score: 8    End of Session Equipment Utilized During Treatment: Gait belt Activity Tolerance: Patient tolerated treatment well Patient left: in chair;with call bell/phone within reach;with chair alarm set Nurse Communication: Mobility status (O2) PT Visit Diagnosis: Muscle weakness (generalized) (M62.81);Other abnormalities of gait and mobility (R26.89);Difficulty in walking, not elsewhere classified (R26.2)     Time: 8883-8853 PT Time Calculation (min) (ACUTE ONLY): 30 min  Charges:    $Therapeutic Activity: 8-22 mins PT General Charges $$ ACUTE PT VISIT: 1 Visit                     Richerd Pinal, PT, DPT 06/13/24, 12:48  PM    Richerd CHRISTELLA Pinal 06/13/2024, 12:46 PM

## 2024-06-13 NOTE — Progress Notes (Signed)
 Occupational Therapy Treatment Patient Details Name: Carol Martin MRN: [id number] DOB: [birth year] Today's Date: 06/13/2024   History of present illness Carol Martin is a 83 yo female who presented after HA/nausea/vomiting with aspiration. Admitted for aspiration pneumonia. Had two seizures overnight 06/10/24.  PMHx: dementia, seizure disorder, HFpEF, hypertension, venous insufficiency, OSA, aortic ectasia   OT comments  Patient seen in conjunction with PT in order to address functional deficits. Patient noteably more alert and pleasantly confused. Patient also HOH therefore cognition vs confusion difficult to tease out. Patient mod A for bed mobility, and then unable to complete sit<>stands x3 with elevated bed, and total A from OT and PT (did not clear buttocks). Patient however able to complete incremental scoot transfer to recliner with mod A of 2 (more so for safety due to increased body habitus) but excellent ability to manage and complete. OT recommendation remains appropriate; will continue to follow.      If plan is discharge home, recommend the following:  A lot of help with walking and/or transfers;Two people to help with walking and/or transfers;A lot of help with bathing/dressing/bathroom;Two people to help with bathing/dressing/bathroom;Assistance with cooking/housework;Assistance with feeding;Direct supervision/assist for medications management;Direct supervision/assist for financial management;Assist for transportation;Help with stairs or ramp for entrance;Supervision due to cognitive status   Equipment Recommendations  None recommended by OT    Recommendations for Other Services      Precautions / Restrictions Precautions Precautions: Fall Restrictions Weight Bearing Restrictions Per Provider Order: No       Mobility Bed Mobility Overal bed mobility: Needs Assistance Bed Mobility: Supine to Sit   Sidelying to sit: Mod assist, HOB elevated, Used rails (to exit R side  of bed; assistance to move BLE off EOB & initiate uprighting trunk)            Transfers Overall transfer level: Needs assistance Equipment used: Rolling walker (2 wheels) Transfers: Sit to/from Stand, Bed to chair/wheelchair/BSC Sit to Stand: From elevated surface, +2 physical assistance, Total assist          Lateral/Scoot Transfers: Mod assist, +2 physical assistance, +2 safety/equipment General transfer comment: Attempted sit>stand from EOB x 3 with RW, pt electing to pull on RW vs pushing as she's accustomed to using rollator vs RW. Pt cued to push BLE feet down through floor. Pt requires total assist but unable to fully clear buttocks from EOB. Transitioned to lateral scoot to drop arm recliner on R with pt able to scoot with assistance to guid to recliner. Pt requires cuing re: head/hips relationship to increase ease of scooting back once in recliner.     Balance Overall balance assessment: Needs assistance Sitting-balance support: Feet supported, No upper extremity supported Sitting balance-Leahy Scale: Good                                     ADL either performed or assessed with clinical judgement   ADL Overall ADL's : Needs assistance/impaired     Grooming: Set up;Bed level           Upper Body Dressing : Contact guard assist;Sitting Upper Body Dressing Details (indicate cue type and reason): replacing gown Lower Body Dressing: Total assistance;Bed level Lower Body Dressing Details (indicate cue type and reason): unable to don socks Toilet Transfer: Moderate assistance;+2 for physical assistance;+2 for safety/equipment Toilet Transfer Details (indicate cue type and reason): scooting to recliner  Functional mobility during ADLs: Moderate assistance;+2 for safety/equipment;+2 for physical assistance;Cueing for sequencing;Cueing for safety;Rolling walker (2 wheels) General ADL Comments: Patient seen in conjunction with PT in order to  address functional deficits. Patient noteably more alert and pleasantly confused. Patient also HOH therefore cognition vs confusion difficult to tease out. Patient mod A for bed mobility, and then unable to complete sit<>stands x3 with elevated bed, and total A from OT and PT (did not clear buttocks). Patient however able to complete incremental scoot transfer to recliner with mod A of 2 (more so for safety due to increased body habitus) but excellent ability to manage and complete. OT recommendation remains appropriate; will continue to follow.    Extremity/Trunk Assessment              Vision       Haematologist Communication Communication: Impaired Factors Affecting Communication: Hearing impaired   Cognition Arousal: Alert Behavior During Therapy: WFL for tasks assessed/performed                                 Following commands: Impaired Following commands impaired: Follows one step commands with increased time      Cueing   Cueing Techniques: Verbal cues, Gestural cues, Visual cues, Tactile cues  Exercises      Shoulder Instructions       General Comments Pt received on 2L/min, weaned to room air. SpO2 does drop to 87% with movement/exertional activity but does increase to >/= 90% at rest; pt left on room air & nurse made aware. Provided cuing/education re: pursed lip breathing for pt. Pt does c/o lightheadedness x 1 sitting EOB but reports this improves with pursed lip breathing.    Pertinent Vitals/ Pain       Pain Assessment Pain Assessment: Faces Faces Pain Scale: Hurts little more Pain Location: BLE knees Pain Descriptors / Indicators: Discomfort Pain Intervention(s): Limited activity within patient's tolerance, Monitored during session, Repositioned  Home Living                                          Prior Functioning/Environment              Frequency  Min 2X/week        Progress  Toward Goals  OT Goals(current goals can now be found in the care plan section)  Progress towards OT goals: Progressing toward goals  Acute Rehab OT Goals Patient Stated Goal: to get better OT Goal Formulation: With patient Time For Goal Achievement: 06/24/24 Potential to Achieve Goals: Good  Plan      Co-evaluation      Reason for Co-Treatment: For patient/therapist safety;To address functional/ADL transfers PT goals addressed during session: Mobility/safety with mobility;Balance        AM-PAC OT 6 Clicks Daily Activity     Outcome Measure   Help from another person eating meals?: A Little Help from another person taking care of personal grooming?: A Little Help from another person toileting, which includes using toliet, bedpan, or urinal?: A Lot Help from another person bathing (including washing, rinsing, drying)?: A Lot Help from another person to put on and taking off regular upper body clothing?: A Little Help from another person to put on and taking off regular lower body clothing?:  Total 6 Click Score: 14    End of Session Equipment Utilized During Treatment: Gait belt;Rolling walker (2 wheels)  OT Visit Diagnosis: Unsteadiness on feet (R26.81);Other abnormalities of gait and mobility (R26.89);Muscle weakness (generalized) (M62.81)   Activity Tolerance Patient tolerated treatment well   Patient Left in chair;with call bell/phone within reach   Nurse Communication Mobility status        Time: 8882-8853 OT Time Calculation (min): 29 min  Charges: OT General Charges $OT Visit: 1 Visit OT Treatments $Self Care/Home Management : 8-22 mins  Ronal Gift E. Jaleal Schliep, OTR/L Acute Rehabilitation Services [phone]   Ronal Gift Salt 06/13/2024, 1:28 PM

## 2024-06-13 NOTE — Plan of Care (Signed)

## 2024-06-13 NOTE — Progress Notes (Signed)
 [facility]  Date of Admission:  06/10/2024      Total days of antibiotics 3   Ceftriaxone  8/09 >> c          ASSESSMENT: Carol Martin is a 83 y.o. female admitted from home with:   Confusion/AMS -  Streptococcus Parasanguinous Bacteremia -  Scheduled for TEE tomorrow 8/12 to investigate bacteremia. Though maybe due to wounds to groin/breast with intertrigo and superimposed cellulitis. Seems oriented today - answered all questions correctly. Asked appropriate questions and cooperative.  Viridans strep higher risk for IE and major critieria - TEE was recommended and scheduled tomorrow. Brain MRI unremarkable for acute embolic process.  - continue ceftriaxone   - TEE scheduled 8/12 to help outline duration of treatment   Candidal Intertrigo -  Would likely benefit from InterDry Ag sheet  Continue topical treatments   PLAN: Continue ceftriaxone   Follow up pending micro and 2nd strep ID  TEE 8/12  Principal Problem:   Aspiration pneumonia (HCC) Active Problems:   Essential hypertension   Acute metabolic encephalopathy   Seizure (HCC)   Obesity    aspirin  EC  81 mg Oral Q lunch   feeding supplement  237 mL Oral BID BM   levETIRAcetam   500 mg Oral Q lunch   sodium chloride  flush  3 mL Intravenous Q12H   verapamil   180 mg Oral Q lunch    SUBJECTIVE: Feeling better. Can tell me she is at [facility].   Review of Systems: Review of Systems  Constitutional:  Positive for chills. Negative for fever.  Respiratory:  Negative for cough and sputum production.   Cardiovascular:  Negative for chest pain.  Skin:  Positive for rash.  Neurological:  Negative for dizziness and headaches.    No Known Allergies  OBJECTIVE: Vitals:   06/12/24 2000 06/13/24 0000 06/13/24 0400 06/13/24 0733  BP: 131/84   125/85  Pulse:    70  Resp:      Temp: 98.4 F (36.9 C) 97.9 F (36.6 C) 98.3 F (36.8 C) 98.2 F (36.8 C)  TempSrc: Oral Oral  Oral Axillary  SpO2:      Weight:      Height:       Body mass index is 52.33 kg/m.  Physical Exam Constitutional:      Appearance: She is well-developed. She is obese. She is not ill-appearing.  Cardiovascular:     Rate and Rhythm: Normal rate and regular rhythm.     Heart sounds: No murmur heard. Pulmonary:     Effort: Pulmonary effort is normal.     Breath sounds: Normal breath sounds.  Abdominal:     General: Bowel sounds are normal.     Palpations: Abdomen is soft.  Skin:    General: Skin is warm and dry.  Neurological:     Mental Status: She is alert and oriented to person, place, and time.     Lab Results Lab Results  Component Value Date   WBC 9.3 06/12/2024   HGB 12.8 06/12/2024   HCT 38.8 06/12/2024   MCV 96.0 06/12/2024   PLT 149 (L) 06/12/2024    Lab Results  Component Value Date   CREATININE 0.72 06/12/2024   BUN 12 06/12/2024   NA 138 06/12/2024   K 4.4 06/12/2024   CL 103 06/12/2024   CO2 26 06/12/2024    Lab Results  Component Value Date   ALT 10 06/10/2024  AST 23 06/10/2024   ALKPHOS 71 06/10/2024   BILITOT 1.1 06/10/2024     Microbiology: Recent Results (from the past 240 hours)  Resp panel by RT-PCR (RSV, Flu A&B, Covid) Anterior Nasal Swab     Status: None   Collection Time: 06/10/24  1:55 AM   Specimen: Anterior Nasal Swab  Result Value Ref Range Status   SARS Coronavirus 2 by RT PCR NEGATIVE NEGATIVE Final   Influenza A by PCR NEGATIVE NEGATIVE Final   Influenza B by PCR NEGATIVE NEGATIVE Final    Comment: (NOTE) The Xpert Xpress SARS-CoV-2/FLU/RSV plus assay is intended as an aid in the diagnosis of influenza from Nasopharyngeal swab specimens and should not be used as a sole basis for treatment. Nasal washings and aspirates are unacceptable for Xpert Xpress SARS-CoV-2/FLU/RSV testing.  Fact Sheet for Patients: BloggerCourse.com  Fact Sheet for Healthcare  Providers: SeriousBroker.it  This test is not yet approved or cleared by the [place]  FDA and has been authorized for detection and/or diagnosis of SARS-CoV-2 by FDA under an Emergency Use Authorization (EUA). This EUA will remain in effect (meaning this test can be used) for the duration of the COVID-19 declaration under Section 564(b)(1) of the Act, 21 U.S.C. section 360bbb-3(b)(1), unless the authorization is terminated or revoked.     Resp Syncytial Virus by PCR NEGATIVE NEGATIVE Final    Comment: (NOTE) Fact Sheet for Patients: BloggerCourse.com  Fact Sheet for Healthcare Providers: SeriousBroker.it  This test is not yet approved or cleared by the [place]  FDA and has been authorized for detection and/or diagnosis of SARS-CoV-2 by FDA under an Emergency Use Authorization (EUA). This EUA will remain in effect (meaning this test can be used) for the duration of the COVID-19 declaration under Section 564(b)(1) of the Act, 21 U.S.C. section 360bbb-3(b)(1), unless the authorization is terminated or revoked.  Performed at [facility], [address] [place], [place] [address]   Respiratory (~20 pathogens) panel by PCR     Status: None   Collection Time: 06/10/24  1:55 AM   Specimen: Nasopharyngeal Swab; Respiratory  Result Value Ref Range Status   Adenovirus NOT DETECTED NOT DETECTED Final   Coronavirus 229E NOT DETECTED NOT DETECTED Final    Comment: (NOTE) The Coronavirus on the Respiratory Panel, DOES NOT test for the novel  Coronavirus (2019 nCoV)    Coronavirus HKU1 NOT DETECTED NOT DETECTED Final   Coronavirus NL63 NOT DETECTED NOT DETECTED Final   Coronavirus OC43 NOT DETECTED NOT DETECTED Final   Metapneumovirus NOT DETECTED NOT DETECTED Final   Rhinovirus / Enterovirus NOT DETECTED NOT DETECTED Final   Influenza A NOT DETECTED NOT DETECTED Final   Influenza B NOT DETECTED  NOT DETECTED Final   Parainfluenza Virus 1 NOT DETECTED NOT DETECTED Final   Parainfluenza Virus 2 NOT DETECTED NOT DETECTED Final   Parainfluenza Virus 3 NOT DETECTED NOT DETECTED Final   Parainfluenza Virus 4 NOT DETECTED NOT DETECTED Final   Respiratory Syncytial Virus NOT DETECTED NOT DETECTED Final   Bordetella pertussis NOT DETECTED NOT DETECTED Final   Bordetella Parapertussis NOT DETECTED NOT DETECTED Final   Chlamydophila pneumoniae NOT DETECTED NOT DETECTED Final   Mycoplasma pneumoniae NOT DETECTED NOT DETECTED Final    Comment: Performed at [facility], [address], [place], [place] [address]  Blood culture (routine x 2)     Status: Abnormal   Collection Time: 06/10/24  3:34 AM   Specimen: BLOOD RIGHT HAND  Result  Value Ref Range Status   Specimen Description BLOOD RIGHT HAND  Final   Special Requests   Final    BOTTLES DRAWN AEROBIC AND ANAEROBIC Blood Culture adequate volume   Culture  Setup Time   Final    GRAM POSITIVE COCCI IN CHAINS AEROBIC BOTTLE ONLY CRITICAL VALUE NOTED.  VALUE IS CONSISTENT WITH PREVIOUSLY REPORTED AND CALLED VALUE. Performed at [facility], [address], [place], [place] [address]    Culture STREPTOCOCCUS PARASANGUINIS (A)  Final   Report Status 06/12/2024 FINAL  Final   Organism ID, Bacteria STREPTOCOCCUS PARASANGUINIS  Final      Susceptibility   Streptococcus parasanguinis - MIC*    PENICILLIN <=0.06 SENSITIVE Sensitive     CEFTRIAXONE  <=0.12 SENSITIVE Sensitive     LEVOFLOXACIN 2 SENSITIVE Sensitive     VANCOMYCIN  0.5 SENSITIVE Sensitive     * STREPTOCOCCUS PARASANGUINIS  Blood culture (routine x 2)     Status: Abnormal (Preliminary result)   Collection Time: 06/10/24  3:34 AM   Specimen: BLOOD  Result Value Ref Range Status   Specimen Description BLOOD RIGHT ANTECUBITAL  Final   Special Requests   Final    BOTTLES DRAWN AEROBIC AND ANAEROBIC Blood Culture adequate volume   Culture  Setup Time   Final    GRAM  POSITIVE COCCI IN PAIRS IN CHAINS ANAEROBIC BOTTLE ONLY CRITICAL RESULT CALLED TO, READ BACK BY AND VERIFIED WITH: MAYA PRENTICE POISSON [id number] AT 2036 BY EC    Culture (A)  Final    STREPTOCOCCUS SPECIES NON HEMOLYTIC CULTURE REINCUBATED FOR BETTER GROWTH Performed at [facility], [address] [place], [place] [address]    Report Status PENDING  Incomplete  Blood Culture ID Panel (Reflexed)     Status: Abnormal   Collection Time: 06/10/24  3:34 AM  Result Value Ref Range Status   Enterococcus faecalis NOT DETECTED NOT DETECTED Final   Enterococcus Faecium NOT DETECTED NOT DETECTED Final   Listeria monocytogenes NOT DETECTED NOT DETECTED Final   Staphylococcus species NOT DETECTED NOT DETECTED Final   Staphylococcus aureus (BCID) NOT DETECTED NOT DETECTED Final   Staphylococcus epidermidis NOT DETECTED NOT DETECTED Final   Staphylococcus lugdunensis NOT DETECTED NOT DETECTED Final   Streptococcus species DETECTED (A) NOT DETECTED Final    Comment: Not Enterococcus species, Streptococcus agalactiae, Streptococcus pyogenes, or Streptococcus pneumoniae. CRITICAL RESULT CALLED TO, READ BACK BY AND VERIFIED WITH: MAYA PRENTICE POISSON [id number] AT 2034 BY EC    Streptococcus agalactiae NOT DETECTED NOT DETECTED Final   Streptococcus pneumoniae NOT DETECTED NOT DETECTED Final   Streptococcus pyogenes NOT DETECTED NOT DETECTED Final   A.calcoaceticus-baumannii NOT DETECTED NOT DETECTED Final   Bacteroides fragilis NOT DETECTED NOT DETECTED Final   Enterobacterales NOT DETECTED NOT DETECTED Final   Enterobacter cloacae complex NOT DETECTED NOT DETECTED Final   Escherichia coli NOT DETECTED NOT DETECTED Final   Klebsiella aerogenes NOT DETECTED NOT DETECTED Final   Klebsiella oxytoca NOT DETECTED NOT DETECTED Final   Klebsiella pneumoniae NOT DETECTED NOT DETECTED Final   Proteus species NOT DETECTED NOT DETECTED Final   Salmonella species NOT DETECTED NOT DETECTED Final    Serratia marcescens NOT DETECTED NOT DETECTED Final   Haemophilus influenzae NOT DETECTED NOT DETECTED Final   Neisseria meningitidis NOT DETECTED NOT DETECTED Final   Pseudomonas aeruginosa NOT DETECTED NOT DETECTED Final   Stenotrophomonas maltophilia NOT DETECTED NOT DETECTED Final   Candida albicans NOT DETECTED NOT DETECTED Final  Candida auris NOT DETECTED NOT DETECTED Final   Candida glabrata NOT DETECTED NOT DETECTED Final   Candida krusei NOT DETECTED NOT DETECTED Final   Candida parapsilosis NOT DETECTED NOT DETECTED Final   Candida tropicalis NOT DETECTED NOT DETECTED Final   Cryptococcus neoformans/gattii NOT DETECTED NOT DETECTED Final    Comment: Performed at [facility], [address] [place], [place] [address]  Culture, blood (Routine X 2) w Reflex to ID Panel     Status: None (Preliminary result)   Collection Time: 06/11/24  2:52 PM   Specimen: BLOOD LEFT ARM  Result Value Ref Range Status   Specimen Description BLOOD LEFT ARM  Final   Special Requests   Final    BOTTLES DRAWN AEROBIC AND ANAEROBIC Blood Culture results may not be optimal due to an inadequate volume of blood received in culture bottles   Culture   Final    NO GROWTH 2 DAYS Performed at [facility], [address], [place], [place] [address]    Report Status PENDING  Incomplete  Culture, blood (Routine X 2) w Reflex to ID Panel     Status: None (Preliminary result)   Collection Time: 06/11/24  2:56 PM   Specimen: BLOOD LEFT HAND  Result Value Ref Range Status   Specimen Description BLOOD LEFT HAND  Final   Special Requests   Final    BOTTLES DRAWN AEROBIC ONLY Blood Culture results may not be optimal due to an inadequate volume of blood received in culture bottles   Culture   Final    NO GROWTH 2 DAYS Performed at [facility], [address] [place], [place] [address]    Report Status PENDING  Incomplete     Corean Fireman, MSN, NP-C [facility]  [email or web address] Pager: [phone] Office: [phone] [facility] Main Line: [phone] *Secure Chat Communication Welcome  Total Encounter Time:

## 2024-06-13 NOTE — Care Management Important Message (Signed)
 Important Message  Patient Details  Name: Carol Martin MRN: [id number] Date of Birth: [birth year]   Important Message Given:  Yes - Medicare IM     Claretta Deed 06/13/2024, 4:28 PM

## 2024-06-14 ENCOUNTER — Encounter (HOSPITAL_COMMUNITY)
Admission: EM | Disposition: A | Discharge status: Home / Self Care | Payer: Self-pay | Source: Home / Self Care | Attending: Internal Medicine

## 2024-06-14 ENCOUNTER — Other Ambulatory Visit (HOSPITAL_COMMUNITY)

## 2024-06-14 DIAGNOSIS — B372 Candidiasis of skin and nail: Secondary | ICD-10-CM

## 2024-06-14 DIAGNOSIS — J690 Pneumonitis due to inhalation of food and vomit: Secondary | ICD-10-CM | POA: Diagnosis not present

## 2024-06-14 DIAGNOSIS — B955 Unspecified streptococcus as the cause of diseases classified elsewhere: Secondary | ICD-10-CM

## 2024-06-14 DIAGNOSIS — R7881 Bacteremia: Secondary | ICD-10-CM

## 2024-06-14 LAB — CULTURE, BLOOD (ROUTINE X 2): Special Requests: ADEQUATE

## 2024-06-14 LAB — GLUCOSE, CAPILLARY
Glucose-Capillary: 132 mg/dL — ABNORMAL HIGH (ref 70–99)
Glucose-Capillary: 83 mg/dL (ref 70–99)
Glucose-Capillary: 124 mg/dL — ABNORMAL HIGH (ref 70–99)

## 2024-06-14 SURGERY — TRANSESOPHAGEAL ECHOCARDIOGRAM (TEE) (CATHLAB)
Anesthesia: Monitor Anesthesia Care

## 2024-06-14 MED ORDER — CEFADROXIL 500 MG PO CAPS
1000.0000 mg | ORAL_CAPSULE | Freq: Two times a day (BID) | ORAL | Status: AC
  Administered 2024-06-14 – 2024-06-15 (×8): 1000 mg via ORAL
  Filled 2024-06-14 (×4): qty 2

## 2024-06-14 NOTE — Progress Notes (Addendum)
 [facility]  Date of Admission:  06/10/2024      Total days of antibiotics 4   Ceftriaxone  8/09 >> c          ASSESSMENT: Carol Martin is a 83 y.o. female admitted from home with:   Confusion/AMS -  Streptococcus Parasanguinous Bacteremia -  Streptococccus Salivarius Bacteremia -  Cancelled TEE 8/12 given discordant results from blood cultures weighing this more to be contaminants.  - cancel TEE - cover for secondary cellulitis to inframammary and inguinal body folds with oral option.   Candidal Intertrigo -  Possible Superimposed Cellulitis  -  Would likely benefit from InterDry Ag sheet  Continue topical treatments  Treating possible secondary cellulitis 7 d course --> can do oral cefadroxil  for her to complete through 8/16  Aspiration Pneumonitis vs PNA -  -Treated  - SLP eval completed   PLAN: Change to oral cefadroxil  1 gm BID for secondary cellulitis coverage through 8/13 ID will sign off - please call back with any questions/concerns or if we can be of further assistance.     Principal Problem:   Aspiration pneumonia (HCC) Active Problems:   Essential hypertension   Acute metabolic encephalopathy   Seizure (HCC)   Obesity    aspirin  EC  81 mg Oral Q lunch   feeding supplement  237 mL Oral BID BM   levETIRAcetam   500 mg Oral Q lunch   sodium chloride  flush  3 mL Intravenous Q12H   verapamil   180 mg Oral Q lunch    SUBJECTIVE: Feeling ok without complaints. Can tell me she is at [facility].   Review of Systems: Review of Systems  Constitutional:  Negative for chills and fever.  Respiratory:  Negative for cough and sputum production.   Cardiovascular:  Negative for chest pain.  Skin:  Positive for rash.  Neurological:  Negative for dizziness and headaches.    No Known Allergies  OBJECTIVE: Vitals:   06/13/24 2321 06/14/24 0400 06/14/24 0749 06/14/24 0800  BP:   130/89 (!) 141/99  Pulse:   62 65   Resp:   16 16  Temp: 98.1 F (36.7 C) 97.9 F (36.6 C) 98.6 F (37 C)   TempSrc: Oral  Oral   SpO2: 92%  94% 91%  Weight:      Height:       Body mass index is 52.33 kg/m.  Physical Exam Constitutional:      Appearance: She is well-developed. She is obese. She is not ill-appearing.  Cardiovascular:     Rate and Rhythm: Normal rate and regular rhythm.     Heart sounds: No murmur heard. Pulmonary:     Effort: Pulmonary effort is normal.     Breath sounds: Normal breath sounds.  Abdominal:     General: Bowel sounds are normal.     Palpations: Abdomen is soft.  Skin:    General: Skin is warm and dry.  Neurological:     Mental Status: She is alert and oriented to person, place, and time.     Lab Results Lab Results  Component Value Date   WBC 9.3 06/12/2024   HGB 12.8 06/12/2024   HCT 38.8 06/12/2024   MCV 96.0 06/12/2024   PLT 149 (L) 06/12/2024    Lab Results  Component Value Date   CREATININE 0.72 06/12/2024   BUN 12 06/12/2024   NA 138 06/12/2024   K 4.4 06/12/2024  CL 103 06/12/2024   CO2 26 06/12/2024    Lab Results  Component Value Date   ALT 10 06/10/2024   AST 23 06/10/2024   ALKPHOS 71 06/10/2024   BILITOT 1.1 06/10/2024     Microbiology: Recent Results (from the past 240 hours)  Resp panel by RT-PCR (RSV, Flu A&B, Covid) Anterior Nasal Swab     Status: None   Collection Time: 06/10/24  1:55 AM   Specimen: Anterior Nasal Swab  Result Value Ref Range Status   SARS Coronavirus 2 by RT PCR NEGATIVE NEGATIVE Final   Influenza A by PCR NEGATIVE NEGATIVE Final   Influenza B by PCR NEGATIVE NEGATIVE Final    Comment: (NOTE) The Xpert Xpress SARS-CoV-2/FLU/RSV plus assay is intended as an aid in the diagnosis of influenza from Nasopharyngeal swab specimens and should not be used as a sole basis for treatment. Nasal washings and aspirates are unacceptable for Xpert Xpress SARS-CoV-2/FLU/RSV testing.  Fact Sheet for  Patients: BloggerCourse.com  Fact Sheet for Healthcare Providers: SeriousBroker.it  This test is not yet approved or cleared by the [place]  FDA and has been authorized for detection and/or diagnosis of SARS-CoV-2 by FDA under an Emergency Use Authorization (EUA). This EUA will remain in effect (meaning this test can be used) for the duration of the COVID-19 declaration under Section 564(b)(1) of the Act, 21 U.S.C. section 360bbb-3(b)(1), unless the authorization is terminated or revoked.     Resp Syncytial Virus by PCR NEGATIVE NEGATIVE Final    Comment: (NOTE) Fact Sheet for Patients: BloggerCourse.com  Fact Sheet for Healthcare Providers: SeriousBroker.it  This test is not yet approved or cleared by the [place]  FDA and has been authorized for detection and/or diagnosis of SARS-CoV-2 by FDA under an Emergency Use Authorization (EUA). This EUA will remain in effect (meaning this test can be used) for the duration of the COVID-19 declaration under Section 564(b)(1) of the Act, 21 U.S.C. section 360bbb-3(b)(1), unless the authorization is terminated or revoked.  Performed at [facility], [address] [place], [place] [address]   Respiratory (~20 pathogens) panel by PCR     Status: None   Collection Time: 06/10/24  1:55 AM   Specimen: Nasopharyngeal Swab; Respiratory  Result Value Ref Range Status   Adenovirus NOT DETECTED NOT DETECTED Final   Coronavirus 229E NOT DETECTED NOT DETECTED Final    Comment: (NOTE) The Coronavirus on the Respiratory Panel, DOES NOT test for the novel  Coronavirus (2019 nCoV)    Coronavirus HKU1 NOT DETECTED NOT DETECTED Final   Coronavirus NL63 NOT DETECTED NOT DETECTED Final   Coronavirus OC43 NOT DETECTED NOT DETECTED Final   Metapneumovirus NOT DETECTED NOT DETECTED Final   Rhinovirus / Enterovirus NOT DETECTED NOT  DETECTED Final   Influenza A NOT DETECTED NOT DETECTED Final   Influenza B NOT DETECTED NOT DETECTED Final   Parainfluenza Virus 1 NOT DETECTED NOT DETECTED Final   Parainfluenza Virus 2 NOT DETECTED NOT DETECTED Final   Parainfluenza Virus 3 NOT DETECTED NOT DETECTED Final   Parainfluenza Virus 4 NOT DETECTED NOT DETECTED Final   Respiratory Syncytial Virus NOT DETECTED NOT DETECTED Final   Bordetella pertussis NOT DETECTED NOT DETECTED Final   Bordetella Parapertussis NOT DETECTED NOT DETECTED Final   Chlamydophila pneumoniae NOT DETECTED NOT DETECTED Final   Mycoplasma pneumoniae NOT DETECTED NOT DETECTED Final    Comment: Performed at [facility], [address] [place], [place] [address]  Blood culture (routine  x 2)     Status: Abnormal   Collection Time: 06/10/24  3:34 AM   Specimen: BLOOD RIGHT HAND  Result Value Ref Range Status   Specimen Description BLOOD RIGHT HAND  Final   Special Requests   Final    BOTTLES DRAWN AEROBIC AND ANAEROBIC Blood Culture adequate volume   Culture  Setup Time   Final    GRAM POSITIVE COCCI IN CHAINS AEROBIC BOTTLE ONLY CRITICAL VALUE NOTED.  VALUE IS CONSISTENT WITH PREVIOUSLY REPORTED AND CALLED VALUE. Performed at [facility], [address] [place], [place] [address]    Culture STREPTOCOCCUS PARASANGUINIS (A)  Final   Report Status 06/12/2024 FINAL  Final   Organism ID, Bacteria STREPTOCOCCUS PARASANGUINIS  Final      Susceptibility   Streptococcus parasanguinis - MIC*    PENICILLIN <=0.06 SENSITIVE Sensitive     CEFTRIAXONE  <=0.12 SENSITIVE Sensitive     LEVOFLOXACIN 2 SENSITIVE Sensitive     VANCOMYCIN  0.5 SENSITIVE Sensitive     * STREPTOCOCCUS PARASANGUINIS  Blood culture (routine x 2)     Status: Abnormal   Collection Time: 06/10/24  3:34 AM   Specimen: BLOOD  Result Value Ref Range Status   Specimen Description BLOOD RIGHT ANTECUBITAL  Final   Special Requests   Final    BOTTLES DRAWN AEROBIC AND ANAEROBIC  Blood Culture adequate volume   Culture  Setup Time   Final    GRAM POSITIVE COCCI IN PAIRS IN CHAINS ANAEROBIC BOTTLE ONLY CRITICAL RESULT CALLED TO, READ BACK BY AND VERIFIED WITH: MAYA PRENTICE POISSON [phone] AT 2036 BY EC Performed at [facility], [address] [place], [place] [address]    Culture STREPTOCOCCUS SALIVARIUS (A)  Final   Report Status 06/14/2024 FINAL  Final   Organism ID, Bacteria STREPTOCOCCUS SALIVARIUS  Final      Susceptibility   Streptococcus salivarius - MIC*    PENICILLIN 0.12 SENSITIVE Sensitive     CEFTRIAXONE  <=0.12 SENSITIVE Sensitive     ERYTHROMYCIN <=0.12 SENSITIVE Sensitive     LEVOFLOXACIN 4 INTERMEDIATE Intermediate     VANCOMYCIN  1 SENSITIVE Sensitive     * STREPTOCOCCUS SALIVARIUS  Blood Culture ID Panel (Reflexed)     Status: Abnormal   Collection Time: 06/10/24  3:34 AM  Result Value Ref Range Status   Enterococcus faecalis NOT DETECTED NOT DETECTED Final   Enterococcus Faecium NOT DETECTED NOT DETECTED Final   Listeria monocytogenes NOT DETECTED NOT DETECTED Final   Staphylococcus species NOT DETECTED NOT DETECTED Final   Staphylococcus aureus (BCID) NOT DETECTED NOT DETECTED Final   Staphylococcus epidermidis NOT DETECTED NOT DETECTED Final   Staphylococcus lugdunensis NOT DETECTED NOT DETECTED Final   Streptococcus species DETECTED (A) NOT DETECTED Final    Comment: Not Enterococcus species, Streptococcus agalactiae, Streptococcus pyogenes, or Streptococcus pneumoniae. CRITICAL RESULT CALLED TO, READ BACK BY AND VERIFIED WITH: MAYA PRENTICE POISSON [phone] AT 2034 BY EC    Streptococcus agalactiae NOT DETECTED NOT DETECTED Final   Streptococcus pneumoniae NOT DETECTED NOT DETECTED Final   Streptococcus pyogenes NOT DETECTED NOT DETECTED Final   A.calcoaceticus-baumannii NOT DETECTED NOT DETECTED Final   Bacteroides fragilis NOT DETECTED NOT DETECTED Final   Enterobacterales NOT DETECTED NOT DETECTED Final   Enterobacter  cloacae complex NOT DETECTED NOT DETECTED Final   Escherichia coli NOT DETECTED NOT DETECTED Final   Klebsiella aerogenes NOT DETECTED NOT DETECTED Final   Klebsiella oxytoca NOT DETECTED NOT DETECTED Final   Klebsiella pneumoniae  NOT DETECTED NOT DETECTED Final   Proteus species NOT DETECTED NOT DETECTED Final   Salmonella species NOT DETECTED NOT DETECTED Final   Serratia marcescens NOT DETECTED NOT DETECTED Final   Haemophilus influenzae NOT DETECTED NOT DETECTED Final   Neisseria meningitidis NOT DETECTED NOT DETECTED Final   Pseudomonas aeruginosa NOT DETECTED NOT DETECTED Final   Stenotrophomonas maltophilia NOT DETECTED NOT DETECTED Final   Candida albicans NOT DETECTED NOT DETECTED Final   Candida auris NOT DETECTED NOT DETECTED Final   Candida glabrata NOT DETECTED NOT DETECTED Final   Candida krusei NOT DETECTED NOT DETECTED Final   Candida parapsilosis NOT DETECTED NOT DETECTED Final   Candida tropicalis NOT DETECTED NOT DETECTED Final   Cryptococcus neoformans/gattii NOT DETECTED NOT DETECTED Final    Comment: Performed at [facility], [address] [place], [place] [address]  Culture, blood (Routine X 2) w Reflex to ID Panel     Status: None (Preliminary result)   Collection Time: 06/11/24  2:52 PM   Specimen: BLOOD LEFT ARM  Result Value Ref Range Status   Specimen Description BLOOD LEFT ARM  Final   Special Requests   Final    BOTTLES DRAWN AEROBIC AND ANAEROBIC Blood Culture results may not be optimal due to an inadequate volume of blood received in culture bottles   Culture   Final    NO GROWTH 3 DAYS Performed at [facility], [address], [place], [place] [address]    Report Status PENDING  Incomplete  Culture, blood (Routine X 2) w Reflex to ID Panel     Status: None (Preliminary result)   Collection Time: 06/11/24  2:56 PM   Specimen: BLOOD LEFT HAND  Result Value Ref Range Status   Specimen Description BLOOD LEFT HAND  Final   Special  Requests   Final    BOTTLES DRAWN AEROBIC ONLY Blood Culture results may not be optimal due to an inadequate volume of blood received in culture bottles   Culture   Final    NO GROWTH 3 DAYS Performed at [facility], [address], [place], [place] [address]    Report Status PENDING  Incomplete     Corean Fireman, MSN, NP-C [facility]  [email or web address] Pager: [phone] Office: [phone] [facility] Main Line: [phone] *Secure Chat Communication Welcome  Total Encounter Time:

## 2024-06-14 NOTE — Progress Notes (Signed)
 PROGRESS NOTE    Carol Martin  FMW:[id number] DOB: 06-Sep-1941 DOA: 06/10/2024 PCP: Shelda Atlas, MD     Brief Narrative:  Carol Martin is a 83 y.o. female with hx of dementia (mild), seizure disorder, HFpEF, hypertension, venous insufficiency, OSA, aortic ectasia, who was brought in with headache, nausea, vomiting.  She had witnessed aspiration by EMS and requiring O2 in the ED. Daughter reports that at her baseline she can be disoriented but is able to be alert and converse. She requires some assistance with transfers / some ADLS. But able to feed herself. She ambulates with the use of a walker.    Patient was in her usual state of health when she complained of headaches on Thursday evening.  Headaches were difficult to characterize, just described as pain.  EMS was called, and patient had witnessed vomiting followed by aspiration.  She was transferred to the hospital at that time.  Daughter states that headache has improved since being in the hospital.  This has been managed with Tylenol .  No documented fevers or subjective fever/chills.  Had some abdominal pain.  No further nausea or vomiting.  Denies any shortness of breath.  Has constipation at baseline.  Daughter states that patient's mentation is slowed and not quite back to her baseline. Patient was admitted with acute metabolic encephalopathy, nausea, vomiting and aspiration. Patient has had longstanding seizure disorder > 8 years to the daughter's recollection.  Patient however is noncompliant with her Keppra , and has been known to be skipping her doses.   ID consulted due to blood culture result +strep. Initially had recommended TEE but subsequently blood culture revealed 2 separate strains and TEE was no longer deemed necessary.   New events last 24 hours / Subjective: No new issues overnight.   Assessment & Plan:   Principal Problem:   Aspiration pneumonia (HCC) Active Problems:   Essential hypertension   Acute metabolic  encephalopathy   Seizure (HCC)   Obesity   Acute metabolic encephalopathy  - Concern for meningitis is low, patient has no fevers, no neck stiffness.  She is able to follow all commands appropriately.  She is alert and oriented to self, city, month (but not to hospital) - At baseline, she has some forgetfulness but is conversant, independent of ADLs.  Uses walker for ambulation.  Lives at home with daughter. - Has seizure disorder and has been missing Keppra  dosing - MRI brain without acute intracranial abnormality - EEG without seizures or definitive epileptiform discharges - Improved   Aspiration pneumonitis - Witnessed aspiration of gastric contents by EMS - Empiric Unasyn  --> Rocephin   - SLP eval, patient can advance to regular solids, but started on dysphagia 3 as she is edentulous  Strep bacteremia - ID consulted  - Rocephin  - Echocardiogram EF 60 to 65%, grade 1 diastolic dysfunction, inadequate visualization of cardiac valves - Initially had recommended TEE but subsequently blood culture revealed 2 separate strains and TEE was no longer deemed necessary.   Seizure disorder - Keppra .  Medically noncompliant at home  Hypertension - Verapamil    Obesity - BMI 52.33    DVT prophylaxis:  SCDs Start: 06/10/24 0520  Code Status: Full code Family Communication: Discussed with daughter [name] over the phone today Disposition Plan: Daughter declined SNF or home health  Status is: Inpatient Remains inpatient appropriate because: IV antibiotics. Hopeful dc home 8/13     Antimicrobials:  Anti-infectives (From admission, onward)    Start     Dose/Rate Route Frequency  Ordered Stop   06/11/24 1530  cefTRIAXone  (ROCEPHIN ) 2 g in sodium chloride  0.9 % 100 mL IVPB        2 g 200 mL/hr over 30 Minutes Intravenous Every 24 hours 06/11/24 1443     06/10/24 0600  Ampicillin -Sulbactam (UNASYN ) 3 g in sodium chloride  0.9 % 100 mL IVPB  Status:  Discontinued        3 g 200 mL/hr  over 30 Minutes Intravenous Every 6 hours 06/10/24 0340 06/11/24 1443   06/10/24 0300  cefTRIAXone  (ROCEPHIN ) 1 g in sodium chloride  0.9 % 100 mL IVPB  Status:  Discontinued        1 g 200 mL/hr over 30 Minutes Intravenous  Once 06/10/24 0258 06/10/24 0340   06/10/24 0300  azithromycin  (ZITHROMAX ) 500 mg in sodium chloride  0.9 % 250 mL IVPB        500 mg 250 mL/hr over 60 Minutes Intravenous  Once 06/10/24 0258 06/10/24 0449        Objective: Vitals:   06/13/24 2321 06/14/24 0400 06/14/24 0749 06/14/24 0800  BP:   130/89 (!) 141/99  Pulse:   62 65  Resp:   16 16  Temp: 98.1 F (36.7 C) 97.9 F (36.6 C) 98.6 F (37 C)   TempSrc: Oral  Oral   SpO2: 92%  94% 91%  Weight:      Height:       No intake or output data in the 24 hours ending 06/14/24 1052  Filed Weights   06/10/24 0142  Weight: 134 kg    Examination:  General exam: Appears calm and comfortable  Respiratory system: Clear to auscultation. Respiratory effort normal. No respiratory distress. No conversational dyspnea.  Cardiovascular system: S1 & S2 heard, RRR. No murmurs. No pedal edema. Gastrointestinal system: Abdomen is nondistended, soft and nontender. Normal bowel sounds heard. Central nervous system: Alert  Extremities: Symmetric in appearance   Data Reviewed: I have personally reviewed following labs and imaging studies  CBC: Recent Labs  Lab 06/10/24 0155 06/10/24 1258 06/11/24 0611 06/12/24 0806  WBC 11.8* 17.9* 12.0* 9.3  NEUTROABS 10.0*  --   --   --   HGB 15.1* 14.9 13.4 12.8  HCT 46.5* 44.7 41.3 38.8  MCV 97.3 97.0 97.6 96.0  PLT 201 183 150 149*   Basic Metabolic Panel: Recent Labs  Lab 06/10/24 0155 06/10/24 1258 06/11/24 0611 06/12/24 0806  NA 135 137 142 138  K 3.8 4.1 3.8 4.4  CL 104 103 107 103  CO2 22 24 24 26   GLUCOSE 169* 126* 112* 105*  BUN 11 10 11 12   CREATININE 0.92 0.85 0.81 0.72  CALCIUM 8.6* 8.5* 8.7* 8.6*   GFR: Estimated Creatinine Clearance: 72.8 mL/min  (by C-G formula based on SCr of 0.72 mg/dL). Liver Function Tests: Recent Labs  Lab 06/10/24 0155 06/10/24 1258  AST 27 23  ALT 11 10  ALKPHOS 79 71  BILITOT 1.3* 1.1  PROT 6.8 6.4*  ALBUMIN 3.5 3.0*   No results for input(s): LIPASE, AMYLASE in the last 168 hours. No results for input(s): AMMONIA in the last 168 hours. Coagulation Profile: No results for input(s): INR, PROTIME in the last 168 hours. Cardiac Enzymes: No results for input(s): CKTOTAL, CKMB, CKMBINDEX, TROPONINI in the last 168 hours. BNP (last 3 results) No results for input(s): PROBNP in the last 8760 hours. HbA1C: No results for input(s): HGBA1C in the last 72 hours. CBG: Recent Labs  Lab 06/11/24 0021 06/11/24 0740 06/11/24  1136 06/11/24 1659 06/14/24 0751  GLUCAP 120* 110* 119* 117* 132*   Lipid Profile: No results for input(s): CHOL, HDL, LDLCALC, TRIG, CHOLHDL, LDLDIRECT in the last 72 hours. Thyroid  Function Tests: No results for input(s): TSH, T4TOTAL, FREET4, T3FREE, THYROIDAB in the last 72 hours. Anemia Panel: No results for input(s): VITAMINB12, FOLATE, FERRITIN, TIBC, IRON, RETICCTPCT in the last 72 hours. Sepsis Labs: Recent Labs  Lab 06/11/24 9388  PROCALCITON 2.64    Recent Results (from the past 240 hours)  Resp panel by RT-PCR (RSV, Flu A&B, Covid) Anterior Nasal Swab     Status: None   Collection Time: 06/10/24  1:55 AM   Specimen: Anterior Nasal Swab  Result Value Ref Range Status   SARS Coronavirus 2 by RT PCR NEGATIVE NEGATIVE Final   Influenza A by PCR NEGATIVE NEGATIVE Final   Influenza B by PCR NEGATIVE NEGATIVE Final    Comment: (NOTE) The Xpert Xpress SARS-CoV-2/FLU/RSV plus assay is intended as an aid in the diagnosis of influenza from Nasopharyngeal swab specimens and should not be used as a sole basis for treatment. Nasal washings and aspirates are unacceptable for Xpert Xpress  SARS-CoV-2/FLU/RSV testing.  Fact Sheet for Patients: BloggerCourse.com  Fact Sheet for Healthcare Providers: SeriousBroker.it  This test is not yet approved or cleared by the [place]  FDA and has been authorized for detection and/or diagnosis of SARS-CoV-2 by FDA under an Emergency Use Authorization (EUA). This EUA will remain in effect (meaning this test can be used) for the duration of the COVID-19 declaration under Section 564(b)(1) of the Act, 21 U.S.C. section 360bbb-3(b)(1), unless the authorization is terminated or revoked.     Resp Syncytial Virus by PCR NEGATIVE NEGATIVE Final    Comment: (NOTE) Fact Sheet for Patients: BloggerCourse.com  Fact Sheet for Healthcare Providers: SeriousBroker.it  This test is not yet approved or cleared by the [place]  FDA and has been authorized for detection and/or diagnosis of SARS-CoV-2 by FDA under an Emergency Use Authorization (EUA). This EUA will remain in effect (meaning this test can be used) for the duration of the COVID-19 declaration under Section 564(b)(1) of the Act, 21 U.S.C. section 360bbb-3(b)(1), unless the authorization is terminated or revoked.  Performed at [facility], [address], [place], [place] [address]   Respiratory (~20 pathogens) panel by PCR     Status: None   Collection Time: 06/10/24  1:55 AM   Specimen: Nasopharyngeal Swab; Respiratory  Result Value Ref Range Status   Adenovirus NOT DETECTED NOT DETECTED Final   Coronavirus 229E NOT DETECTED NOT DETECTED Final    Comment: (NOTE) The Coronavirus on the Respiratory Panel, DOES NOT test for the novel  Coronavirus (2019 nCoV)    Coronavirus HKU1 NOT DETECTED NOT DETECTED Final   Coronavirus NL63 NOT DETECTED NOT DETECTED Final   Coronavirus OC43 NOT DETECTED NOT DETECTED Final   Metapneumovirus NOT DETECTED NOT DETECTED Final    Rhinovirus / Enterovirus NOT DETECTED NOT DETECTED Final   Influenza A NOT DETECTED NOT DETECTED Final   Influenza B NOT DETECTED NOT DETECTED Final   Parainfluenza Virus 1 NOT DETECTED NOT DETECTED Final   Parainfluenza Virus 2 NOT DETECTED NOT DETECTED Final   Parainfluenza Virus 3 NOT DETECTED NOT DETECTED Final   Parainfluenza Virus 4 NOT DETECTED NOT DETECTED Final   Respiratory Syncytial Virus NOT DETECTED NOT DETECTED Final   Bordetella pertussis NOT DETECTED NOT DETECTED Final   Bordetella Parapertussis NOT DETECTED NOT DETECTED Final  Chlamydophila pneumoniae NOT DETECTED NOT DETECTED Final   Mycoplasma pneumoniae NOT DETECTED NOT DETECTED Final    Comment: Performed at [facility], [address], [place], [place] [address]  Blood culture (routine x 2)     Status: Abnormal   Collection Time: 06/10/24  3:34 AM   Specimen: BLOOD RIGHT HAND  Result Value Ref Range Status   Specimen Description BLOOD RIGHT HAND  Final   Special Requests   Final    BOTTLES DRAWN AEROBIC AND ANAEROBIC Blood Culture adequate volume   Culture  Setup Time   Final    GRAM POSITIVE COCCI IN CHAINS AEROBIC BOTTLE ONLY CRITICAL VALUE NOTED.  VALUE IS CONSISTENT WITH PREVIOUSLY REPORTED AND CALLED VALUE. Performed at [facility], [address] [place], [place] [address]    Culture STREPTOCOCCUS PARASANGUINIS (A)  Final   Report Status 06/12/2024 FINAL  Final   Organism ID, Bacteria STREPTOCOCCUS PARASANGUINIS  Final      Susceptibility   Streptococcus parasanguinis - MIC*    PENICILLIN <=0.06 SENSITIVE Sensitive     CEFTRIAXONE  <=0.12 SENSITIVE Sensitive     LEVOFLOXACIN 2 SENSITIVE Sensitive     VANCOMYCIN  0.5 SENSITIVE Sensitive     * STREPTOCOCCUS PARASANGUINIS  Blood culture (routine x 2)     Status: Abnormal   Collection Time: 06/10/24  3:34 AM   Specimen: BLOOD  Result Value Ref Range Status   Specimen Description BLOOD RIGHT ANTECUBITAL  Final   Special Requests   Final     BOTTLES DRAWN AEROBIC AND ANAEROBIC Blood Culture adequate volume   Culture  Setup Time   Final    GRAM POSITIVE COCCI IN PAIRS IN CHAINS ANAEROBIC BOTTLE ONLY CRITICAL RESULT CALLED TO, READ BACK BY AND VERIFIED WITH: MAYA PRENTICE POISSON [id number] AT 2036 BY EC Performed at [facility], [address] [place], [place] [address]    Culture STREPTOCOCCUS SALIVARIUS (A)  Final   Report Status 06/14/2024 FINAL  Final   Organism ID, Bacteria STREPTOCOCCUS SALIVARIUS  Final      Susceptibility   Streptococcus salivarius - MIC*    PENICILLIN 0.12 SENSITIVE Sensitive     CEFTRIAXONE  <=0.12 SENSITIVE Sensitive     ERYTHROMYCIN <=0.12 SENSITIVE Sensitive     LEVOFLOXACIN 4 INTERMEDIATE Intermediate     VANCOMYCIN  1 SENSITIVE Sensitive     * STREPTOCOCCUS SALIVARIUS  Blood Culture ID Panel (Reflexed)     Status: Abnormal   Collection Time: 06/10/24  3:34 AM  Result Value Ref Range Status   Enterococcus faecalis NOT DETECTED NOT DETECTED Final   Enterococcus Faecium NOT DETECTED NOT DETECTED Final   Listeria monocytogenes NOT DETECTED NOT DETECTED Final   Staphylococcus species NOT DETECTED NOT DETECTED Final   Staphylococcus aureus (BCID) NOT DETECTED NOT DETECTED Final   Staphylococcus epidermidis NOT DETECTED NOT DETECTED Final   Staphylococcus lugdunensis NOT DETECTED NOT DETECTED Final   Streptococcus species DETECTED (A) NOT DETECTED Final    Comment: Not Enterococcus species, Streptococcus agalactiae, Streptococcus pyogenes, or Streptococcus pneumoniae. CRITICAL RESULT CALLED TO, READ BACK BY AND VERIFIED WITH: MAYA PRENTICE POISSON [id number] AT 2034 BY EC    Streptococcus agalactiae NOT DETECTED NOT DETECTED Final   Streptococcus pneumoniae NOT DETECTED NOT DETECTED Final   Streptococcus pyogenes NOT DETECTED NOT DETECTED Final   A.calcoaceticus-baumannii NOT DETECTED NOT DETECTED Final   Bacteroides fragilis NOT DETECTED NOT DETECTED Final   Enterobacterales NOT DETECTED NOT  DETECTED Final   Enterobacter cloacae  complex NOT DETECTED NOT DETECTED Final   Escherichia coli NOT DETECTED NOT DETECTED Final   Klebsiella aerogenes NOT DETECTED NOT DETECTED Final   Klebsiella oxytoca NOT DETECTED NOT DETECTED Final   Klebsiella pneumoniae NOT DETECTED NOT DETECTED Final   Proteus species NOT DETECTED NOT DETECTED Final   Salmonella species NOT DETECTED NOT DETECTED Final   Serratia marcescens NOT DETECTED NOT DETECTED Final   Haemophilus influenzae NOT DETECTED NOT DETECTED Final   Neisseria meningitidis NOT DETECTED NOT DETECTED Final   Pseudomonas aeruginosa NOT DETECTED NOT DETECTED Final   Stenotrophomonas maltophilia NOT DETECTED NOT DETECTED Final   Candida albicans NOT DETECTED NOT DETECTED Final   Candida auris NOT DETECTED NOT DETECTED Final   Candida glabrata NOT DETECTED NOT DETECTED Final   Candida krusei NOT DETECTED NOT DETECTED Final   Candida parapsilosis NOT DETECTED NOT DETECTED Final   Candida tropicalis NOT DETECTED NOT DETECTED Final   Cryptococcus neoformans/gattii NOT DETECTED NOT DETECTED Final    Comment: Performed at [facility], [address], [place], [place] [address]  Culture, blood (Routine X 2) w Reflex to ID Panel     Status: None (Preliminary result)   Collection Time: 06/11/24  2:52 PM   Specimen: BLOOD LEFT ARM  Result Value Ref Range Status   Specimen Description BLOOD LEFT ARM  Final   Special Requests   Final    BOTTLES DRAWN AEROBIC AND ANAEROBIC Blood Culture results may not be optimal due to an inadequate volume of blood received in culture bottles   Culture   Final    NO GROWTH 3 DAYS Performed at [facility], [address], [place], [place] [address]    Report Status PENDING  Incomplete  Culture, blood (Routine X 2) w Reflex to ID Panel     Status: None (Preliminary result)   Collection Time: 06/11/24  2:56 PM   Specimen: BLOOD LEFT HAND  Result Value Ref Range Status   Specimen Description BLOOD  LEFT HAND  Final   Special Requests   Final    BOTTLES DRAWN AEROBIC ONLY Blood Culture results may not be optimal due to an inadequate volume of blood received in culture bottles   Culture   Final    NO GROWTH 3 DAYS Performed at [facility], [address], [place], [place] [address]    Report Status PENDING  Incomplete      Radiology Studies: ECHOCARDIOGRAM COMPLETE Result Date: 06/12/2024    ECHOCARDIOGRAM REPORT   Patient Name:   Carol Martin Date of Exam: 06/12/2024 Medical Rec #:  [id number]       Height:       63.0 in Accession #:    [id number]      Weight:       295.4 lb Date of Birth:  05-Oct-1941        BSA:          2.282 m Patient Age:    83 years        BP:           141/77 mmHg Patient Gender: F               HR:           79 bpm. Exam Location:  Inpatient Procedure: 2D Echo, Cardiac Doppler and Color Doppler (Both Spectral and Color            Flow Doppler were utilized during procedure). Indications:  Endocarditis  History:        Patient has prior history of Echocardiogram examinations.                 Endocarditis; Risk Factors:Hypertension.  Sonographer:    Vella Key Referring Phys: [id number] [facility]  Sonographer Comments: Technically difficult study due to poor echo windows, Technically challenging study due to limited acoustic windows, suboptimal parasternal window, suboptimal apical window, suboptimal subcostal window and patient is obese. Image acquisition challenging due to patient body habitus. IMPRESSIONS  1. Left ventricular ejection fraction, by estimation, is 60 to 65%. The left ventricle has normal function. Left ventricular endocardial border not optimally defined to evaluate regional wall motion. There is mild left ventricular hypertrophy. Left ventricular diastolic parameters are consistent with Grade I diastolic dysfunction (impaired relaxation). Elevated left ventricular end-diastolic pressure.  2. Right ventricular systolic function is not well visualized  but appears to be normal. The right ventricular size is normal. Tricuspid regurgitation signal is inadequate for assessing PA pressure.  3. The mitral valve is grossly normal. No evidence of mitral valve regurgitation. No evidence of mitral stenosis.  4. The aortic valve was not well visualized. Aortic valve regurgitation is mild. No aortic stenosis is present.  5. Aortic dilatation noted. There is borderline dilatation of the ascending aorta, measuring 39 mm. Conclusion(s)/Recommendation(s): Poor acoustic windows. Inadequate visualization of cardiac valves. Consider TEE, if clinically indicated, to r/o infective endocarditis. FINDINGS  Left Ventricle: Left ventricular ejection fraction, by estimation, is 60 to 65%. The left ventricle has normal function. Left ventricular endocardial border not optimally defined to evaluate regional wall motion. Strain was performed and the global longitudinal strain is indeterminate. The left ventricular internal cavity size was normal in size. There is mild left ventricular hypertrophy. Left ventricular diastolic parameters are consistent with Grade I diastolic dysfunction (impaired relaxation).  Elevated left ventricular end-diastolic pressure. Right Ventricle: The right ventricular size is normal. No increase in right ventricular wall thickness. Right ventricular systolic function is not well visualized but appears to be normal. Tricuspid regurgitation signal is inadequate for assessing PA pressure. Left Atrium: Left atrial size was normal in size. Right Atrium: Right atrial size was normal in size. Pericardium: There is no evidence of pericardial effusion. Mitral Valve: The mitral valve is grossly normal. No evidence of mitral valve regurgitation. No evidence of mitral valve stenosis. Tricuspid Valve: The tricuspid valve is not well visualized. Tricuspid valve regurgitation is not demonstrated. No evidence of tricuspid stenosis. Aortic Valve: The aortic valve was not well  visualized. Aortic valve regurgitation is mild. No aortic stenosis is present. Pulmonic Valve: The pulmonic valve was not well visualized. Pulmonic valve regurgitation is trivial. No evidence of pulmonic stenosis. Aorta: The aortic root is normal in size and structure and aortic dilatation noted. There is borderline dilatation of the ascending aorta, measuring 39 mm. Venous: The inferior vena cava was not well visualized. IAS/Shunts: No atrial level shunt detected by color flow Doppler. Additional Comments: 3D was performed not requiring image post processing on an independent workstation and was indeterminate.  LEFT VENTRICLE PLAX 2D LVIDd:         4.40 cm   Diastology LVIDs:         3.10 cm   LV e' medial:    5.66 cm/s LV PW:         1.20 cm   LV E/e' medial:  15.0 LV IVS:        1.20 cm   LV e'  lateral:   4.79 cm/s LVOT diam:     1.80 cm   LV E/e' lateral: 17.7 LV SV:         51 LV SV Index:   23 LVOT Area:     2.54 cm  LEFT ATRIUM             Index LA diam:        3.20 cm 1.40 cm/m LA Vol (A2C):   84.8 ml 37.16 ml/m LA Vol (A4C):   43.2 ml 18.93 ml/m LA Biplane Vol: 64.5 ml 28.26 ml/m  AORTIC VALVE LVOT Vmax:   103.00 cm/s LVOT Vmean:  66.300 cm/s LVOT VTI:    0.202 m  AORTA Ao Root diam: 3.60 cm Ao Asc diam:  3.90 cm MITRAL VALVE MV Area (PHT): 5.34 cm    SHUNTS MV Decel Time: 142 msec    Systemic VTI:  0.20 m MV E velocity: 84.90 cm/s  Systemic Diam: 1.80 cm MV A velocity: 94.30 cm/s MV E/A ratio:  0.90 Vishnu Priya Mallipeddi Electronically signed by Diannah Late Mallipeddi Signature Date/Time: 06/12/2024/4:04:57 PM    Final       Scheduled Meds:  aspirin  EC  81 mg Oral Q lunch   feeding supplement  237 mL Oral BID BM   levETIRAcetam   500 mg Oral Q lunch   sodium chloride  flush  3 mL Intravenous Q12H   verapamil   180 mg Oral Q lunch   Continuous Infusions:  cefTRIAXone  (ROCEPHIN )  IV 2 g (06/13/24 1435)     LOS: 4 days   Time spent: 25 minutes   Delon Hoe, DO [facility] 06/14/2024, 10:52 AM   Available via Epic secure chat 7am-7pm After these hours, please refer to coverage provider listed on [email or web address]

## 2024-06-14 NOTE — Progress Notes (Signed)
   I received a page this AM from Dr. Rojelio requesting that we cancel her TEE today. Per ID, patient does not need TEE anymore.   TEE canceled  Carol FABIENE Louder, PA-C 06/14/2024 9:42 AM

## 2024-06-15 DIAGNOSIS — B372 Candidiasis of skin and nail: Secondary | ICD-10-CM

## 2024-06-15 DIAGNOSIS — G9341 Metabolic encephalopathy: Secondary | ICD-10-CM | POA: Diagnosis not present

## 2024-06-15 DIAGNOSIS — J9601 Acute respiratory failure with hypoxia: Secondary | ICD-10-CM | POA: Diagnosis not present

## 2024-06-15 DIAGNOSIS — J690 Pneumonitis due to inhalation of food and vomit: Secondary | ICD-10-CM | POA: Diagnosis not present

## 2024-06-15 LAB — HEMOGLOBIN A1C
Hgb A1c MFr Bld: 5.9 % — ABNORMAL HIGH (ref 4.8–5.6)
Mean Plasma Glucose: 123 mg/dL

## 2024-06-15 NOTE — Plan of Care (Signed)
  Problem: Education: Goal: Ability to describe self-care measures that may prevent or decrease complications (Diabetes Survival Skills Education) will improve Outcome: Progressing   Problem: Coping: Goal: Ability to adjust to condition or change in health will improve Outcome: Progressing   Problem: Fluid Volume: Goal: Ability to maintain a balanced intake and output will improve Outcome: Progressing   Problem: Health Behavior/Discharge Planning: Goal: Ability to manage health-related needs will improve Outcome: Progressing   Problem: Nutritional: Goal: Maintenance of adequate nutrition will improve Outcome: Progressing Goal: Progress toward achieving an optimal weight will improve Outcome: Progressing   Problem: Skin Integrity: Goal: Risk for impaired skin integrity will decrease Outcome: Progressing

## 2024-06-15 NOTE — Plan of Care (Signed)
  Problem: Health Behavior/Discharge Planning: Goal: Ability to manage health-related needs will improve Outcome: Not Progressing   Problem: Skin Integrity: Goal: Risk for impaired skin integrity will decrease Outcome: Not Progressing   Problem: Education: Goal: Knowledge of General Education information will improve Description: Including pain rating scale, medication(s)/side effects and non-pharmacologic comfort measures Outcome: Not Progressing   Problem: Health Behavior/Discharge Planning: Goal: Ability to manage health-related needs will improve Outcome: Not Progressing

## 2024-06-15 NOTE — Progress Notes (Signed)
 Physical Therapy Treatment Patient Details Name: Carol Martin MRN: [id number] DOB: [birth year] Today's Date: 06/15/2024   History of Present Illness Carol Martin is a 83 yo female who presented after HA/nausea/vomiting with aspiration. Admitted for aspiration pneumonia. Had two seizures overnight 06/10/24.  PMHx: dementia, seizure disorder, HFpEF, hypertension, venous insufficiency, OSA, aortic ectasia    PT Comments  Pt admitted with above diagnosis. Pt was able to stand to [facility] with max assist of 2 initial stand from bed progressing to mod assist of 2 persons once in [place].  Pt with poor tolerance for activity and can only stand up to 20 seconds with poor posture flexed and props on forearms as she fatigues quickly. Pt needs continued post acute rehab < 3 hours day. Daughter present and states they dont want to have pt go to a rehab facility.  Pt will need 24 hour care if home as well as family will need a hoyer lift and wheelchair as pt can't currently stand to RW.  Also will need HHPT,HHOT and HHAide if they refuse Rehab.  Will follow acutely.  Pt currently with functional limitations due to the deficits listed below (see PT Problem List). Pt will benefit from acute skilled PT to increase their independence and safety with mobility to allow discharge.       If plan is discharge home, recommend the following: Two people to help with bathing/dressing/bathroom;Two people to help with walking and/or transfers   Can travel by private vehicle     No  Equipment Recommendations  [place] lift;Wheelchair (measurements PT);Wheelchair cushion (measurements PT)    Recommendations for Other Services       Precautions / Restrictions Precautions Precautions: Fall Recall of Precautions/Restrictions: Intact Restrictions Weight Bearing Restrictions Per Provider Order: No     Mobility  Bed Mobility Overal bed mobility: Needs Assistance Bed Mobility: Supine to Sit Rolling: Mod assist Sidelying to  sit: Mod assist, HOB elevated, Used rails (to exit R side of bed; assistance to move BLE off EOB & initiate uprighting trunk)       General bed mobility comments: up to EOB with A for legs off EOB and to lift trunk    Transfers Overall transfer level: Needs assistance Equipment used: Rolling walker (2 wheels), Ambulation equipment used Transfers: Sit to/from Stand, Bed to chair/wheelchair/BSC Sit to Stand: From elevated surface, +2 physical assistance, Total assist, Max assist, Via lift equipment           General transfer comment: Attempted sit>stand from EOB x 3 with RW, pt electing to pull on RW vs pushing as she's accustomed to using rollator vs RW. Pt cued to push BLE feet down through floor. Pt required total assist to max assist but unable to fully clear buttocks from EOB. Obtained Stedy and with elevated bed and Stedy as well as max assist of 2with use of pad to assist, pt stood to [place].  Pt then stood from [place] 2 more times and tried to work on pt weight shifting and picking up LEs but she couldnt move feet eve with assist and cues.  Pt requires cuing re: head/hips relationship to increase ease of scooting back once in recliner. Transfer via Lift Equipment: Stedy  Ambulation/Gait                   Stairs             Wheelchair Mobility     Tilt Bed    Modified Rankin (Stroke Patients Only)  Balance Overall balance assessment: Needs assistance Sitting-balance support: Feet supported, No upper extremity supported Sitting balance-Leahy Scale: Good     Standing balance support: Bilateral upper extremity supported, During functional activity, Reliant on assistive device for balance Standing balance-Leahy Scale: Zero Standing balance comment: Relies on +2 and heavy  max assist for standing to [employer] Communication Communication: Impaired Factors Affecting Communication: Hearing impaired   Cognition Arousal: Alert Behavior During Therapy: WFL for tasks assessed/performed   PT - Cognitive impairments: Difficult to assess Difficult to assess due to: Hard of hearing/deaf                     PT - Cognition Comments: oriented to place, not day or situation or year, knew month Following commands: Impaired Following commands impaired: Follows one step commands with increased time    Cueing Cueing Techniques: Verbal cues, Gestural cues, Visual cues, Tactile cues  Exercises General Exercises - Lower Extremity Ankle Circles/Pumps: AROM, Both, 10 reps, Supine Quad Sets: AROM, Both, 10 reps, Supine Gluteal Sets: AROM, Both, 10 reps, Supine Long Arc Quad: AROM, Both, 10 reps, Seated Hip Flexion/Marching: AROM, Both, 10 reps, Seated    General Comments General comments (skin integrity, edema, etc.): Pt on RA with VSS.  Daughter present      Pertinent Vitals/Pain Pain Assessment Pain Assessment: Faces Faces Pain Scale: Hurts little more Pain Location: BLE knees Pain Descriptors / Indicators: Discomfort Pain Intervention(s): Limited activity within patient's tolerance, Monitored during session, Repositioned    Home Living                          Prior Function            PT Goals (current goals can now be found in the care plan section) Acute Rehab PT Goals Patient Stated Goal: get up to stand Progress towards PT goals: Progressing toward goals    Frequency    Min 2X/week      PT Plan      Co-evaluation              AM-PAC PT 6 Clicks Mobility   Outcome Measure  Help needed turning from your back to your side while in a flat bed without using bedrails?: A Lot Help needed moving from lying on your back to sitting on the side of a flat bed without using bedrails?: A Lot Help needed moving to and from a bed to a chair (including a wheelchair)?: Total Help needed standing up from a chair using your arms (e.g., wheelchair or bedside  chair)?: Total Help needed to walk in hospital room?: Total Help needed climbing 3-5 steps with a railing? : Total 6 Click Score: 8    End of Session Equipment Utilized During Treatment: Gait belt Activity Tolerance: Patient tolerated treatment well;Patient limited by fatigue Patient left: in chair;with call bell/phone within reach;with chair alarm set Nurse Communication: Mobility status PT Visit Diagnosis: Muscle weakness (generalized) (M62.81);Other abnormalities of gait and mobility (R26.89);Difficulty in walking, not elsewhere classified (R26.2)     Time: 8845-8777 PT Time Calculation (min) (ACUTE ONLY): 28 min  Charges:    $Therapeutic Exercise: 8-22 mins $Therapeutic Activity: 8-22 mins PT General Charges $$ ACUTE PT VISIT: 1 Visit  [facility] M,PT Acute Rehab Services [phone]    Stephane JULIANNA Bevel 06/15/2024, 1:58 PM

## 2024-06-15 NOTE — Progress Notes (Signed)
 PROGRESS NOTE        PATIENT DETAILS Name: Carol Martin Age: 83 y.o. Sex: female Date of Birth: 1941-03-23 Admit Date: 06/10/2024 Admitting Physician Dorn Dawson, MD ERE:Jcalzmz, Aliene, MD  Brief Summary: Patient is a 83 y.o.  female with history of dementia, seizure disorder, HFpEF, HTN who presented with headache/confusion-she had a episode of vomiting in the ED with suspected aspiration event.  She was found to have acute metabolic encephalopathy in the setting of aspiration pneumonitis and Streptococcus bacteremia.  Significant events: 8/8>> admit to [facility]  Significant studies: 8/8>> CXR: Left> right lung base opacities. 8/8>> x-ray abdomen: Nonobstructive bowel gas pattern. 8/9>> MRI brain: No acute intracranial pathology. 8/9>> EEG: No seizures. 8/10>> TTE: EF 60-65%.  Significant microbiology data: 8/8>> COVID/influenza/RSV PCR: Negative 8/8>> respiratory virus panel: Negative 8/8>> blood culture: Streptococcus parasanguinous. 8/8>> blood culture: Streptococcus salivarius. 8/9>> blood culture: No growth  Procedures: None  Consults: ID  Subjective: Lying comfortably in bed-denies any chest pain or shortness of breath.  Objective: Vitals: Blood pressure 127/75, pulse 71, temperature 98.1 F (36.7 C), temperature source Oral, resp. rate 20, height 5' 3 (1.6 m), weight 134 kg, SpO2 96%.   Exam: Gen Exam: Frail/sick appearing-but not in any distress. HEENT:atraumatic, normocephalic Chest: B/L clear to auscultation anteriorly CVS:S1S2 regular Abdomen:soft non tender, non distended Extremities:no edema Neurology: Non focal-but moving all 4 extremities symmetrically. Skin: no rash  Pertinent Labs/Radiology:    Latest Ref Rng & Units 06/12/2024    8:06 AM 06/11/2024    6:11 AM 06/10/2024   12:58 PM  CBC  WBC 4.0 - 10.5 K/uL 9.3  12.0  17.9   Hemoglobin 12.0 - 15.0 g/dL 87.1  86.5  85.0   Hematocrit 36.0 - 46.0 % 38.8  41.3  44.7    Platelets 150 - 400 K/uL 149  150  183     Lab Results  Component Value Date   NA 138 06/12/2024   K 4.4 06/12/2024   CL 103 06/12/2024   CO2 26 06/12/2024    Assessment/Plan: Acute metabolic encephalopathy Likely secondary to PNA/bacteremia Resolved-back to baseline. MRI brain/EEG stable.  Aspiration pneumonia Aspiration event in the ED/with EMS Initially on Unasyn -has been transitioned to oral antibiotics Evaluated by SLP-tolerating dysphagia 3 diet.  Streptococcus bacteremia Appreciate ID input-given discordant blood culture results on the same day-this perhaps could be a contaminant.  Irrespective-on treatment-last day of antibiotics 8/13. Note per ID-no need for TEE.  Candidal intertrigo/superimposed cellulitis Improved.  HTN Stable Verapamil  Lisinopril /HCTZ on hold.  Chronic HFpEF Euvolemic As needed Demadex  for edema/weight gain.  Seizure disorder Keppra -noncompliant at home.  Debility/deconditioning PT recommending SNF-however patient/family not keen on going to SNF.  Spoke with daughter earlier this morning-await repeat PT/OT eval today-to see how she does-if still weak-requiring SNF-family to reconsider.  Class 3 Obesity: Estimated body mass index is 52.33 kg/m as calculated from the following:   Height as of this encounter: 5' 3 (1.6 m).   Weight as of this encounter: 134 kg.   Code status:   Code Status: Full Code   DVT Prophylaxis: SCDs Start: 06/10/24 0520   Family Communication: Daughter at bedside during rounds this morning.   Disposition Plan: Status is: Inpatient Remains inpatient appropriate because: Severity of illness   Planned Discharge Destination:TBD   Diet: Diet Order  DIET DYS 3 Room service appropriate? Yes with Assist; Fluid consistency: Thin  Diet effective now                     Antimicrobial agents: Anti-infectives (From admission, onward)    Start     Dose/Rate Route Frequency Ordered Stop    06/14/24 1200  cefadroxil  (DURICEF) capsule 1,000 mg        1,000 mg Oral 2 times daily 06/14/24 1107 06/16/24 0959   06/11/24 1530  cefTRIAXone  (ROCEPHIN ) 2 g in sodium chloride  0.9 % 100 mL IVPB  Status:  Discontinued        2 g 200 mL/hr over 30 Minutes Intravenous Every 24 hours 06/11/24 1443 06/14/24 1107   06/10/24 0600  Ampicillin -Sulbactam (UNASYN ) 3 g in sodium chloride  0.9 % 100 mL IVPB  Status:  Discontinued        3 g 200 mL/hr over 30 Minutes Intravenous Every 6 hours 06/10/24 0340 06/11/24 1443   06/10/24 0300  cefTRIAXone  (ROCEPHIN ) 1 g in sodium chloride  0.9 % 100 mL IVPB  Status:  Discontinued        1 g 200 mL/hr over 30 Minutes Intravenous  Once 06/10/24 0258 06/10/24 0340   06/10/24 0300  azithromycin  (ZITHROMAX ) 500 mg in sodium chloride  0.9 % 250 mL IVPB        500 mg 250 mL/hr over 60 Minutes Intravenous  Once 06/10/24 0258 06/10/24 0449        MEDICATIONS: Scheduled Meds:  aspirin  EC  81 mg Oral Q lunch   cefadroxil   1,000 mg Oral BID   feeding supplement  237 mL Oral BID BM   levETIRAcetam   500 mg Oral Q lunch   sodium chloride  flush  3 mL Intravenous Q12H   verapamil   180 mg Oral Q lunch   Continuous Infusions: PRN Meds:.acetaminophen , albuterol , hydrOXYzine , melatonin, ondansetron  (ZOFRAN ) IV, polyethylene glycol, prochlorperazine , traMADol    I have personally reviewed following labs and imaging studies  LABORATORY DATA: CBC: Recent Labs  Lab 06/10/24 0155 06/10/24 1258 06/11/24 0611 06/12/24 0806  WBC 11.8* 17.9* 12.0* 9.3  NEUTROABS 10.0*  --   --   --   HGB 15.1* 14.9 13.4 12.8  HCT 46.5* 44.7 41.3 38.8  MCV 97.3 97.0 97.6 96.0  PLT 201 183 150 149*    Basic Metabolic Panel: Recent Labs  Lab 06/10/24 0155 06/10/24 1258 06/11/24 0611 06/12/24 0806  NA 135 137 142 138  K 3.8 4.1 3.8 4.4  CL 104 103 107 103  CO2 22 24 24 26   GLUCOSE 169* 126* 112* 105*  BUN 11 10 11 12   CREATININE 0.92 0.85 0.81 0.72  CALCIUM 8.6* 8.5* 8.7*  8.6*    GFR: Estimated Creatinine Clearance: 72.8 mL/min (by C-G formula based on SCr of 0.72 mg/dL).  Liver Function Tests: Recent Labs  Lab 06/10/24 0155 06/10/24 1258  AST 27 23  ALT 11 10  ALKPHOS 79 71  BILITOT 1.3* 1.1  PROT 6.8 6.4*  ALBUMIN 3.5 3.0*   No results for input(s): LIPASE, AMYLASE in the last 168 hours. No results for input(s): AMMONIA in the last 168 hours.  Coagulation Profile: No results for input(s): INR, PROTIME in the last 168 hours.  Cardiac Enzymes: No results for input(s): CKTOTAL, CKMB, CKMBINDEX, TROPONINI in the last 168 hours.  BNP (last 3 results) No results for input(s): PROBNP in the last 8760 hours.  Lipid Profile: No results for input(s): CHOL, HDL, LDLCALC, TRIG, CHOLHDL,  LDLDIRECT in the last 72 hours.  Thyroid  Function Tests: No results for input(s): TSH, T4TOTAL, FREET4, T3FREE, THYROIDAB in the last 72 hours.  Anemia Panel: No results for input(s): VITAMINB12, FOLATE, FERRITIN, TIBC, IRON, RETICCTPCT in the last 72 hours.  Urine analysis:    Component Value Date/Time   COLORURINE YELLOW 06/16/2023 0318   APPEARANCEUR HAZY (A) 06/16/2023 0318   LABSPEC 1.019 06/16/2023 0318   PHURINE 6.0 06/16/2023 0318   GLUCOSEU NEGATIVE 06/16/2023 0318   HGBUR SMALL (A) 06/16/2023 0318   BILIRUBINUR NEGATIVE 06/16/2023 0318   KETONESUR NEGATIVE 06/16/2023 0318   PROTEINUR NEGATIVE 06/16/2023 0318   UROBILINOGEN 0.2 05/23/2014 1548   NITRITE NEGATIVE 06/16/2023 0318   LEUKOCYTESUR TRACE (A) 06/16/2023 0318    Sepsis Labs: Lactic Acid, Venous    Component Value Date/Time   LATICACIDVEN 1.5 10/13/2023 1102    MICROBIOLOGY: Recent Results (from the past 240 hours)  Resp panel by RT-PCR (RSV, Flu A&B, Covid) Anterior Nasal Swab     Status: None   Collection Time: 06/10/24  1:55 AM   Specimen: Anterior Nasal Swab  Result Value Ref Range Status   SARS Coronavirus 2 by RT PCR  NEGATIVE NEGATIVE Final   Influenza A by PCR NEGATIVE NEGATIVE Final   Influenza B by PCR NEGATIVE NEGATIVE Final    Comment: (NOTE) The Xpert Xpress SARS-CoV-2/FLU/RSV plus assay is intended as an aid in the diagnosis of influenza from Nasopharyngeal swab specimens and should not be used as a sole basis for treatment. Nasal washings and aspirates are unacceptable for Xpert Xpress SARS-CoV-2/FLU/RSV testing.  Fact Sheet for Patients: BloggerCourse.com  Fact Sheet for Healthcare Providers: SeriousBroker.it  This test is not yet approved or cleared by the [place]  FDA and has been authorized for detection and/or diagnosis of SARS-CoV-2 by FDA under an Emergency Use Authorization (EUA). This EUA will remain in effect (meaning this test can be used) for the duration of the COVID-19 declaration under Section 564(b)(1) of the Act, 21 U.S.C. section 360bbb-3(b)(1), unless the authorization is terminated or revoked.     Resp Syncytial Virus by PCR NEGATIVE NEGATIVE Final    Comment: (NOTE) Fact Sheet for Patients: BloggerCourse.com  Fact Sheet for Healthcare Providers: SeriousBroker.it  This test is not yet approved or cleared by the [place]  FDA and has been authorized for detection and/or diagnosis of SARS-CoV-2 by FDA under an Emergency Use Authorization (EUA). This EUA will remain in effect (meaning this test can be used) for the duration of the COVID-19 declaration under Section 564(b)(1) of the Act, 21 U.S.C. section 360bbb-3(b)(1), unless the authorization is terminated or revoked.  Performed at [facility], [address] [place], [place] [address]   Respiratory (~20 pathogens) panel by PCR     Status: None   Collection Time: 06/10/24  1:55 AM   Specimen: Nasopharyngeal Swab; Respiratory  Result Value Ref Range Status   Adenovirus NOT DETECTED NOT  DETECTED Final   Coronavirus 229E NOT DETECTED NOT DETECTED Final    Comment: (NOTE) The Coronavirus on the Respiratory Panel, DOES NOT test for the novel  Coronavirus (2019 nCoV)    Coronavirus HKU1 NOT DETECTED NOT DETECTED Final   Coronavirus NL63 NOT DETECTED NOT DETECTED Final   Coronavirus OC43 NOT DETECTED NOT DETECTED Final   Metapneumovirus NOT DETECTED NOT DETECTED Final   Rhinovirus / Enterovirus NOT DETECTED NOT DETECTED Final   Influenza A NOT DETECTED NOT DETECTED Final   Influenza B NOT DETECTED NOT DETECTED  Final   Parainfluenza Virus 1 NOT DETECTED NOT DETECTED Final   Parainfluenza Virus 2 NOT DETECTED NOT DETECTED Final   Parainfluenza Virus 3 NOT DETECTED NOT DETECTED Final   Parainfluenza Virus 4 NOT DETECTED NOT DETECTED Final   Respiratory Syncytial Virus NOT DETECTED NOT DETECTED Final   Bordetella pertussis NOT DETECTED NOT DETECTED Final   Bordetella Parapertussis NOT DETECTED NOT DETECTED Final   Chlamydophila pneumoniae NOT DETECTED NOT DETECTED Final   Mycoplasma pneumoniae NOT DETECTED NOT DETECTED Final    Comment: Performed at [facility], [address], [place], [place] [address]  Blood culture (routine x 2)     Status: Abnormal   Collection Time: 06/10/24  3:34 AM   Specimen: BLOOD RIGHT HAND  Result Value Ref Range Status   Specimen Description BLOOD RIGHT HAND  Final   Special Requests   Final    BOTTLES DRAWN AEROBIC AND ANAEROBIC Blood Culture adequate volume   Culture  Setup Time   Final    GRAM POSITIVE COCCI IN CHAINS AEROBIC BOTTLE ONLY CRITICAL VALUE NOTED.  VALUE IS CONSISTENT WITH PREVIOUSLY REPORTED AND CALLED VALUE. Performed at [facility], [address], [place], [place] [address]    Culture STREPTOCOCCUS PARASANGUINIS (A)  Final   Report Status 06/12/2024 FINAL  Final   Organism ID, Bacteria STREPTOCOCCUS PARASANGUINIS  Final      Susceptibility   Streptococcus parasanguinis - MIC*    PENICILLIN <=0.06  SENSITIVE Sensitive     CEFTRIAXONE  <=0.12 SENSITIVE Sensitive     LEVOFLOXACIN 2 SENSITIVE Sensitive     VANCOMYCIN  0.5 SENSITIVE Sensitive     * STREPTOCOCCUS PARASANGUINIS  Blood culture (routine x 2)     Status: Abnormal   Collection Time: 06/10/24  3:34 AM   Specimen: BLOOD  Result Value Ref Range Status   Specimen Description BLOOD RIGHT ANTECUBITAL  Final   Special Requests   Final    BOTTLES DRAWN AEROBIC AND ANAEROBIC Blood Culture adequate volume   Culture  Setup Time   Final    GRAM POSITIVE COCCI IN PAIRS IN CHAINS ANAEROBIC BOTTLE ONLY CRITICAL RESULT CALLED TO, READ BACK BY AND VERIFIED WITH: MAYA PRENTICE POISSON [id number] AT 2036 BY EC Performed at [facility], [address], [place], [place] [address]    Culture STREPTOCOCCUS SALIVARIUS (A)  Final   Report Status 06/14/2024 FINAL  Final   Organism ID, Bacteria STREPTOCOCCUS SALIVARIUS  Final      Susceptibility   Streptococcus salivarius - MIC*    PENICILLIN 0.12 SENSITIVE Sensitive     CEFTRIAXONE  <=0.12 SENSITIVE Sensitive     ERYTHROMYCIN <=0.12 SENSITIVE Sensitive     LEVOFLOXACIN 4 INTERMEDIATE Intermediate     VANCOMYCIN  1 SENSITIVE Sensitive     * STREPTOCOCCUS SALIVARIUS  Blood Culture ID Panel (Reflexed)     Status: Abnormal   Collection Time: 06/10/24  3:34 AM  Result Value Ref Range Status   Enterococcus faecalis NOT DETECTED NOT DETECTED Final   Enterococcus Faecium NOT DETECTED NOT DETECTED Final   Listeria monocytogenes NOT DETECTED NOT DETECTED Final   Staphylococcus species NOT DETECTED NOT DETECTED Final   Staphylococcus aureus (BCID) NOT DETECTED NOT DETECTED Final   Staphylococcus epidermidis NOT DETECTED NOT DETECTED Final   Staphylococcus lugdunensis NOT DETECTED NOT DETECTED Final   Streptococcus species DETECTED (A) NOT DETECTED Final    Comment: Not Enterococcus species, Streptococcus agalactiae, Streptococcus pyogenes, or Streptococcus pneumoniae. CRITICAL RESULT CALLED TO, READ  BACK BY AND VERIFIED WITH: MAYA PRENTICE POISSON [id number] AT 2034 BY EC    Streptococcus agalactiae NOT DETECTED NOT DETECTED Final   Streptococcus pneumoniae NOT DETECTED NOT DETECTED Final   Streptococcus pyogenes NOT DETECTED NOT DETECTED Final   A.calcoaceticus-baumannii NOT DETECTED NOT DETECTED Final   Bacteroides fragilis NOT DETECTED NOT DETECTED Final   Enterobacterales NOT DETECTED NOT DETECTED Final   Enterobacter cloacae complex NOT DETECTED NOT DETECTED Final   Escherichia coli NOT DETECTED NOT DETECTED Final   Klebsiella aerogenes NOT DETECTED NOT DETECTED Final   Klebsiella oxytoca NOT DETECTED NOT DETECTED Final   Klebsiella pneumoniae NOT DETECTED NOT DETECTED Final   Proteus species NOT DETECTED NOT DETECTED Final   Salmonella species NOT DETECTED NOT DETECTED Final   Serratia marcescens NOT DETECTED NOT DETECTED Final   Haemophilus influenzae NOT DETECTED NOT DETECTED Final   Neisseria meningitidis NOT DETECTED NOT DETECTED Final   Pseudomonas aeruginosa NOT DETECTED NOT DETECTED Final   Stenotrophomonas maltophilia NOT DETECTED NOT DETECTED Final   Candida albicans NOT DETECTED NOT DETECTED Final   Candida auris NOT DETECTED NOT DETECTED Final   Candida glabrata NOT DETECTED NOT DETECTED Final   Candida krusei NOT DETECTED NOT DETECTED Final   Candida parapsilosis NOT DETECTED NOT DETECTED Final   Candida tropicalis NOT DETECTED NOT DETECTED Final   Cryptococcus neoformans/gattii NOT DETECTED NOT DETECTED Final    Comment: Performed at [facility], [address] [place], [place] [address]  Culture, blood (Routine X 2) w Reflex to ID Panel     Status: None (Preliminary result)   Collection Time: 06/11/24  2:52 PM   Specimen: BLOOD LEFT ARM  Result Value Ref Range Status   Specimen Description BLOOD LEFT ARM  Final   Special Requests   Final    BOTTLES DRAWN AEROBIC AND ANAEROBIC Blood Culture results may not be optimal due to an inadequate volume of blood  received in culture bottles   Culture   Final    NO GROWTH 4 DAYS Performed at [facility], [address] [place], [place] [address]    Report Status PENDING  Incomplete  Culture, blood (Routine X 2) w Reflex to ID Panel     Status: None (Preliminary result)   Collection Time: 06/11/24  2:56 PM   Specimen: BLOOD LEFT HAND  Result Value Ref Range Status   Specimen Description BLOOD LEFT HAND  Final   Special Requests   Final    BOTTLES DRAWN AEROBIC ONLY Blood Culture results may not be optimal due to an inadequate volume of blood received in culture bottles   Culture   Final    NO GROWTH 4 DAYS Performed at [facility], [address] [place], [place] [address]    Report Status PENDING  Incomplete    RADIOLOGY STUDIES/RESULTS: No results found.   LOS: 5 days   Donalda Applebaum, MD  [facility]    To contact the attending provider between 7A-7P or the covering provider during after hours 7P-7A, please log into the web site [email or web address] and access using universal [place] password for that web site. If you do not have the password, please call the hospital operator.  06/15/2024, 12:47 PM

## 2024-06-15 NOTE — TOC Progression Note (Signed)
 Transition of Care [facility]) - Progression Note    Patient Details  Name: Carol Martin MRN: [id number] Date of Birth: 03/29/1941  Transition of Care [facility]) CM/SW Contact  Roxie KANDICE Stain, RN Phone Number: 06/15/2024, 4:03 PM  Clinical Narrative:    Spoke to daughter , Glenford, regarding transition needs.  [name] is agreeable to home health. This RNCM offered choice for Home Health, patient's daughter, Glenford bullock she has no preference, RNCM made referral to [facility] with Hedda, He is able to take referral.  Patient has all needed DME.   Patient will need PTAR transportation home.    Expected Discharge Plan: Home w Home Health Services Barriers to Discharge: Continued Medical Work up               Expected Discharge Plan and Services In-house Referral: Clinical Social Work   Post Acute Care Choice: NA Living arrangements for the past 2 months: Single Family Home                           HH Arranged: PT HH Agency: [facility] Date [facility] Agency Contacted: 06/15/24 Time HH Agency Contacted: 1603 Representative spoke with at [facility] Agency: Darleene   Social Drivers of Health (SDOH) Interventions SDOH Screenings   Food Insecurity: No Food Insecurity (06/10/2024)  Housing: Low Risk  (06/10/2024)  Transportation Needs: No Transportation Needs (06/10/2024)  Utilities: Not At Risk (06/10/2024)  Financial Resource Strain: Low Risk  (09/09/2020)   Received from [facility]  Social Connections: Moderately Integrated (06/10/2024)  Tobacco Use: Low Risk  (06/10/2024)    Readmission Risk Interventions     No data to display

## 2024-06-15 NOTE — TOC Progression Note (Signed)
 Transition of Care [facility]) - Progression Note    Patient Details  Name: Carol Martin MRN: [id number] Date of Birth: 1941-06-13  Transition of Care [facility]) CM/SW Contact  Inocente GORMAN Kindle, LCSW Phone Number: 06/15/2024, 3:00 PM  Clinical Narrative:    CSW met with patient's daughter, Glenford, with grandchildren at bedside. She confirmed they want home health for patient and potentially PTAR for transport home. Please contact daughter, [name], to confirm timing at discharge. No DME needs. Patient currently on room air.    Expected Discharge Plan: Home w Home Health Services Barriers to Discharge: Continued Medical Work up               Expected Discharge Plan and Services In-house Referral: Clinical Social Work   Post Acute Care Choice: NA Living arrangements for the past 2 months: Single Family Home                                       Social Drivers of Health (SDOH) Interventions SDOH Screenings   Food Insecurity: No Food Insecurity (06/10/2024)  Housing: Low Risk  (06/10/2024)  Transportation Needs: No Transportation Needs (06/10/2024)  Utilities: Not At Risk (06/10/2024)  Financial Resource Strain: Low Risk  (09/09/2020)   Received from [facility]  Social Connections: Moderately Integrated (06/10/2024)  Tobacco Use: Low Risk  (06/10/2024)    Readmission Risk Interventions     No data to display

## 2024-06-15 NOTE — TOC Progression Note (Signed)
 Transition of Care [facility]) - Progression Note    Patient Details  Name: Carol Martin MRN: [id number] Date of Birth: 05/12/1941  Transition of Care [facility]) CM/SW Contact  Carol GORMAN Kindle, LCSW Phone Number: 06/15/2024, 3:00 PM  Clinical Narrative:    CSW met with patient's daughter, Carol Martin, with grandchildren at bedside. She confirmed they want home health for patient and potentially PTAR for transport home. Please contact daughter, Carol Martin, to confirm timing at discharge. No DME needs. Patient currently on room air.    Expected Discharge Plan: Home w Home Health Services Barriers to Discharge: Continued Medical Work up               Expected Discharge Plan and Services In-house Referral: Clinical Social Work   Post Acute Care Choice: NA Living arrangements for the past 2 months: Single Family Home                                       Social Drivers of Health (SDOH) Interventions SDOH Screenings   Food Insecurity: No Food Insecurity (06/10/2024)  Housing: Low Risk  (06/10/2024)  Transportation Needs: No Transportation Needs (06/10/2024)  Utilities: Not At Risk (06/10/2024)  Financial Resource Strain: Low Risk  (09/09/2020)   Received from [facility]  Social Connections: Moderately Integrated (06/10/2024)  Tobacco Use: Low Risk  (06/10/2024)    Readmission Risk Interventions     No data to display

## 2024-06-16 DIAGNOSIS — I10 Essential (primary) hypertension: Secondary | ICD-10-CM

## 2024-06-16 DIAGNOSIS — R569 Unspecified convulsions: Secondary | ICD-10-CM | POA: Diagnosis not present

## 2024-06-16 DIAGNOSIS — J9601 Acute respiratory failure with hypoxia: Secondary | ICD-10-CM | POA: Diagnosis not present

## 2024-06-16 DIAGNOSIS — J690 Pneumonitis due to inhalation of food and vomit: Secondary | ICD-10-CM | POA: Diagnosis not present

## 2024-06-16 LAB — CULTURE, BLOOD (ROUTINE X 2)
Culture: NO GROWTH
Culture: NO GROWTH

## 2024-06-16 NOTE — Plan of Care (Signed)

## 2024-06-16 NOTE — Discharge Instructions (Signed)
 We spoke about waivers, however these are more for developmentally disabled individuals  Personal Care Services (PCS) About PCS Personal Care Services [facility]) provides personal care services to individuals residing in a:  Private living arrangement Residential facility licensed by   as an adult care home Combination home as defined in G.S. 131E-101(1a). Group home licensed under Chapter 122C of the General Statutes and under 10A NCAC 27G.5601 as a supervised living facility for two or more adults whose primary diagnosis is mental illness, a developmental disability or substance abuse dependency Eligibility These services benefit individuals who require assistance with activities of daily living (ADLs), including:  Eating Dressing Bathing Toileting Mobility To qualify for PCS, an individual must have a medical condition, disability or cognitive impairment, and demonstrates unmet needs for:  Three of the five ADLs with limited hands-on assistance Two ADLs, one of which requires extensive assistance Two ADLs, one of which requires assistance at the full dependence level PCS program eligibility is determined by an independent assessment conducted by [facility] Medicaid or its designee, and is provided according to an individualized service plan.

## 2024-06-16 NOTE — Care Management (Signed)
 PTAR called

## 2024-06-16 NOTE — Progress Notes (Signed)
 Physical Therapy Treatment Patient Details Name: Carol Martin MRN: [id number] DOB: 07-25-1941 Today's Date: 06/16/2024   History of Present Illness [name] is a 83 yo female who presented after HA/nausea/vomiting with aspiration. Admitted for aspiration pneumonia. Had two seizures overnight 06/10/24.  PMHx: dementia, seizure disorder, HFpEF, hypertension, venous insufficiency, OSA, aortic ectasia    PT Comments  Pt is slowly progressing towards goals. Currently pt is CGA for supine to sitting, Max A +2 for sitting to supine, Max A +2 for standing in the stedy; pt is limited by knee pain today. Due to pt current functional status, home set up and available assistance at home recommending skilled physical therapy services < 3 hours/day in order to address strength, balance and functional mobility to decrease risk for falls, injury, immobility, skin break down and re-hospitalization. Pt family prefers home and is aware pt requires 24/7 physical assistance.      If plan is discharge home, recommend the following: Two people to help with bathing/dressing/bathroom;Two people to help with walking and/or transfers   Can travel by private vehicle     No  Equipment Recommendations  [place] lift;Wheelchair (measurements PT);Wheelchair cushion (measurements PT);Hospital bed       Precautions / Restrictions Precautions Precautions: Fall Recall of Precautions/Restrictions: Intact Restrictions Weight Bearing Restrictions Per Provider Order: No     Mobility  Bed Mobility Overal bed mobility: Needs Assistance Bed Mobility: Supine to Sit     Supine to sit: Contact guard, HOB elevated, Used rails Sit to supine: +2 for safety/equipment, Max assist   General bed mobility comments: significant increase in time. Pt performed supine to sitting with extra time HOB elevated ~ 60 degrees, Max A with assist for LE and trunk to get to bed.    Transfers Overall transfer level: Needs  assistance Equipment used: Ambulation equipment used Transfers: Sit to/from Stand Sit to Stand: From elevated surface, +2 physical assistance, Max assist           General transfer comment: pt stood in stedy 3x; pulling herself up initially Max A to remain standing progressed to Mod A but continues to require physical assist to remain standing. On the last sit to stand pt was able to lower herself to bed with CGA x2    Ambulation/Gait   General Gait Details: unable due to pt current functional status     Balance Overall balance assessment: Needs assistance Sitting-balance support: Feet supported, No upper extremity supported Sitting balance-Leahy Scale: Good     Standing balance support: Bilateral upper extremity supported, During functional activity, Reliant on assistive device for balance Standing balance-Leahy Scale: Poor Standing balance comment: Mostly relies on +2 and heavy assist for standing in the stedy        Communication Communication Communication: Impaired Factors Affecting Communication: Hearing impaired  Cognition Arousal: Alert Behavior During Therapy: WFL for tasks assessed/performed   PT - Cognitive impairments: No apparent impairments Difficult to assess due to: Hard of hearing/deaf     Following commands: Intact      Cueing Cueing Techniques: Verbal cues, Gestural cues, Visual cues, Tactile cues     General Comments General comments (skin integrity, edema, etc.): Daughter and grandson present. No signs/symptoms of cardiac/respiratory distress during session      Pertinent Vitals/Pain Pain Assessment Pain Assessment: Faces Faces Pain Scale: Hurts little more Pain Location: BLE knees Pain Descriptors / Indicators: Discomfort Pain Intervention(s): Monitored during session, Limited activity within patient's tolerance     PT Goals (current  goals can now be found in the care plan section) Acute Rehab PT Goals Patient Stated Goal: get up to  stand PT Goal Formulation: With patient Time For Goal Achievement: 06/25/24 Potential to Achieve Goals: Fair Progress towards PT goals: Progressing toward goals    Frequency    Min 2X/week      PT Plan  Continue with current POC     Co-evaluation PT/OT/SLP Co-Evaluation/Treatment: Yes Reason for Co-Treatment: For patient/therapist safety;To address functional/ADL transfers PT goals addressed during session: Mobility/safety with mobility;Balance OT goals addressed during session: Strengthening/ROM      AM-PAC PT 6 Clicks Mobility   Outcome Measure  Help needed turning from your back to your side while in a flat bed without using bedrails?: A Little Help needed moving from lying on your back to sitting on the side of a flat bed without using bedrails?: A Little Help needed moving to and from a bed to a chair (including a wheelchair)?: Total Help needed standing up from a chair using your arms (e.g., wheelchair or bedside chair)?: Total Help needed to walk in hospital room?: Total Help needed climbing 3-5 steps with a railing? : Total 6 Click Score: 10    End of Session Equipment Utilized During Treatment: Gait belt Activity Tolerance: Patient tolerated treatment well;Patient limited by fatigue Patient left: in bed;with call bell/phone within reach;with family/visitor present   PT Visit Diagnosis: Muscle weakness (generalized) (M62.81);Other abnormalities of gait and mobility (R26.89);Difficulty in walking, not elsewhere classified (R26.2)     Time: 8964-8896 PT Time Calculation (min) (ACUTE ONLY): 28 min  Charges:    $Therapeutic Activity: 8-22 mins PT General Charges $$ ACUTE PT VISIT: 1 Visit                     Dorothyann Maier, DPT, CLT  Acute Rehabilitation Services Office: [phone] (Secure chat preferred)    Dorothyann VEAR Maier 06/16/2024, 11:15 AM

## 2024-06-16 NOTE — TOC Transition Note (Signed)
 Transition of Care [facility]) - Discharge Note   Patient Details  Name: Carol Martin MRN: [id number] Date of Birth: September 07, 1941  Transition of Care [facility]) CM/SW Contact:  Corean JAYSON Canary, RN Phone Number: 06/16/2024, 11:31 AM   Clinical Narrative:    Patient is discharging home today. Daughter Olive in room, PT assessed and wondered about hospital bed. Called [place], who lives with patient.  She states at this time they do not need hospital bed. They have tried that before and did not work out. She will call her sister to discuss and call me back if they change their decision. The paitent will need to go home with PTAR. Medical necessity/ face sheet readying for transport, notified nursing when ready to call this RNCM to call PTAR.  Daughter [name] also mentioned that she had to change work to PT to take care of her mother and are there any programs to assist, placed resources on DC instructions, to call social services and follow up with PCP if change minds about bed. Confirmed address,it is correct in chart.    Final next level of care: Home w Home Health Services Barriers to Discharge: No Barriers Identified   Patient Goals and CMS Choice Patient states their goals for this hospitalization and ongoing recovery are:: Return home CMS Medicare.gov Compare Post Acute Care list provided to:: Patient Represenative (must comment) Choice offered to / list presented to : Spouse [place] ownership interest in [facility].provided to:: Adult Children    Discharge Placement                       Discharge Plan and Services Additional resources added to the After Visit Summary for   In-house Referral: Clinical Social Work   Post Acute Care Choice: NA                    HH Arranged: PT HH Agency: [facility] Date [facility] Agency Contacted: 06/15/24 Time HH Agency Contacted: 1603 Representative spoke with at [facility] Agency: Darleene  Social Drivers of Health  (SDOH) Interventions SDOH Screenings   Food Insecurity: No Food Insecurity (06/10/2024)  Housing: Low Risk  (06/10/2024)  Transportation Needs: No Transportation Needs (06/10/2024)  Utilities: Not At Risk (06/10/2024)  Financial Resource Strain: Low Risk  (09/09/2020)   Received from [facility]  Social Connections: Moderately Integrated (06/10/2024)  Tobacco Use: Low Risk  (06/10/2024)     Readmission Risk Interventions     No data to display

## 2024-06-16 NOTE — Plan of Care (Signed)
 Pt has rested quietly throughout the night with no distress noted. Alert and oriented, forgetful. On room air. SR on the monitor. Purewick intact to suction. Medicated for pain and anxiety with relief noted. Family member at bedside. No other complaints voiced.     Problem: Tissue Perfusion: Goal: Adequacy of tissue perfusion will improve Outcome: Progressing   Problem: Education: Goal: Knowledge of General Education information will improve Description: Including pain rating scale, medication(s)/side effects and non-pharmacologic comfort measures Outcome: Progressing   Problem: Clinical Measurements: Goal: Respiratory complications will improve Outcome: Progressing Goal: Cardiovascular complication will be avoided Outcome: Progressing   Problem: Coping: Goal: Level of anxiety will decrease Outcome: Progressing   Problem: Pain Managment: Goal: General experience of comfort will improve and/or be controlled Outcome: Progressing

## 2024-06-16 NOTE — Progress Notes (Signed)
 Occupational Therapy Treatment Patient Details Name: Carol Martin MRN: [id number] DOB: [birth year] Today's Date: 06/16/2024   History of present illness Carol Martin is a 83 yo female who presented after HA/nausea/vomiting with aspiration. Admitted for aspiration pneumonia. Had two seizures overnight 06/10/24.  PMHx: dementia, seizure disorder, HFpEF, hypertension, venous insufficiency, OSA, aortic ectasia   OT comments  Patient received in supine and agreeable to OT/PT treatment. Patient demonstrating gains with bed mobility with patient going from supine to sitting on EOB with CGA and increased time. Patient able to perform 3 stands from EOB into stedy with max assist +2 with limited standing tolerance and assistance of therapists and stedy for balance. Patient required assistance to lower back to EOB except after 3rd stand. Patient was max assist +2 to return to supine. Patient will benefit from continued inpatient follow up therapy, <3 hours/day but family are taking patient home with 24/7 assistance.       If plan is discharge home, recommend the following:  A lot of help with walking and/or transfers;Two people to help with walking and/or transfers;A lot of help with bathing/dressing/bathroom;Two people to help with bathing/dressing/bathroom;Assistance with cooking/housework;Assistance with feeding;Direct supervision/assist for medications management;Direct supervision/assist for financial management;Assist for transportation;Help with stairs or ramp for entrance;Supervision due to cognitive status   Equipment Recommendations  None recommended by OT    Recommendations for Other Services      Precautions / Restrictions Precautions Precautions: Fall Recall of Precautions/Restrictions: Intact Restrictions Weight Bearing Restrictions Per Provider Order: No       Mobility Bed Mobility Overal bed mobility: Needs Assistance Bed Mobility: Supine to Sit, Sit to Supine     Supine to  sit: Contact guard, HOB elevated, Used rails Sit to supine: +2 for safety/equipment, Max assist   General bed mobility comments: increased time and effort to get to EOB, Assistance with trunk and BLE to return to supine    Transfers Overall transfer level: Needs assistance Equipment used: Ambulation equipment used Transfers: Sit to/from Stand Sit to Stand: From elevated surface, +2 physical assistance, Max assist           General transfer comment: 3 stands performed from EOB into Stedy with assist of 2 to stand and for balance with patient able to lower self to EOB after last stand     Balance Overall balance assessment: Needs assistance Sitting-balance support: Feet supported, No upper extremity supported Sitting balance-Leahy Scale: Good     Standing balance support: Bilateral upper extremity supported, During functional activity, Reliant on assistive device for balance Standing balance-Leahy Scale: Poor Standing balance comment: +2 assist for balance and support with patient leaning forward on Stedy                           ADL either performed or assessed with clinical judgement   ADL Overall ADL's : Needs assistance/impaired                                       General ADL Comments: focused on bed mobility and sit to stands    Extremity/Trunk Assessment              Vision       Perception     Praxis     Communication Communication Communication: Impaired Factors Affecting Communication: Hearing impaired   Cognition Arousal: Alert Behavior During  Therapy: WFL for tasks assessed/performed Cognition: History of cognitive impairments                               Following commands: Intact Following commands impaired: Follows one step commands with increased time      Cueing      Exercises      Shoulder Instructions       General Comments family present and support during session    Pertinent  Vitals/ Pain       Pain Assessment Pain Assessment: Faces Faces Pain Scale: Hurts little more Pain Location: BLE knees Pain Descriptors / Indicators: Discomfort Pain Intervention(s): Limited activity within patient's tolerance, Monitored during session, Repositioned  Home Living                                          Prior Functioning/Environment              Frequency  Min 2X/week        Progress Toward Goals  OT Goals(current goals can now be found in the care plan section)  Progress towards OT goals: Progressing toward goals  Acute Rehab OT Goals Patient Stated Goal: to go home OT Goal Formulation: With patient Time For Goal Achievement: 06/24/24 Potential to Achieve Goals: Good ADL Goals Pt Will Perform Grooming: with set-up Pt Will Perform Lower Body Bathing: with mod assist;with adaptive equipment;sitting/lateral leans;sit to/from stand Pt Will Perform Upper Body Dressing: with modified independence;sitting Pt Will Perform Lower Body Dressing: with mod assist;sitting/lateral leans;sit to/from stand;with adaptive equipment Pt Will Transfer to Toilet: with mod assist;with +2 assist;stand pivot transfer;bedside commode Pt Will Perform Toileting - Clothing Manipulation and hygiene: with mod assist;with adaptive equipment;sitting/lateral leans;sit to/from stand Additional ADL Goal #1: Pt will complete bed mobility with mod A as a precursor to ADLs Additional ADL Goal #2: Patient will be able to stand x1 minute in order to increase activity tolerance prior to attempting ADLs or functional mobility.  Plan      Co-evaluation    PT/OT/SLP Co-Evaluation/Treatment: Yes Reason for Co-Treatment: For patient/therapist safety;To address functional/ADL transfers PT goals addressed during session: Mobility/safety with mobility;Balance OT goals addressed during session: Strengthening/ROM      AM-PAC OT 6 Clicks Daily Activity     Outcome Measure    Help from another person eating meals?: A Little Help from another person taking care of personal grooming?: A Little Help from another person toileting, which includes using toliet, bedpan, or urinal?: A Lot Help from another person bathing (including washing, rinsing, drying)?: A Lot Help from another person to put on and taking off regular upper body clothing?: A Little Help from another person to put on and taking off regular lower body clothing?: Total 6 Click Score: 14    End of Session Equipment Utilized During Treatment: Gait belt;Other (comment) Laurent)  OT Visit Diagnosis: Unsteadiness on feet (R26.81);Other abnormalities of gait and mobility (R26.89);Muscle weakness (generalized) (M62.81)   Activity Tolerance Patient tolerated treatment well   Patient Left in bed;with call bell/phone within reach;with bed alarm set;with family/visitor present   Nurse Communication Mobility status        Time: 8962-8896 OT Time Calculation (min): 26 min  Charges: OT General Charges $OT Visit: 1 Visit OT Treatments $Therapeutic Activity: 8-22 mins  Dick Laine, OTA Acute Rehabilitation Services  Office [phone]   Jeb LITTIE Laine 06/16/2024, 2:02 PM

## 2024-06-16 NOTE — Discharge Summary (Signed)
 PATIENT DETAILS Name: Carol Martin Age: 83 y.o. Sex: female Date of Birth: Nov 25, 1940 MRN: [id number]. Admitting Physician: Dorn Dawson, MD ERE:Jcalzmz, Aliene, MD  Admit Date: 06/10/2024 Discharge date: 06/16/2024  Recommendations for Outpatient Follow-up:  Follow up with PCP in 1-2 weeks Please obtain CMP/CBC in one week Lisinopril /HCTZ on hold-resume when able.  Admitted From:  Home  Disposition: Home health (family refused SNF)   Discharge Condition: fair  CODE STATUS:   Code Status: Full Code   Diet recommendation:  Diet Order             Diet - low sodium heart healthy           DIET DYS 3 Room service appropriate? Yes with Assist; Fluid consistency: Thin  Diet effective now                    Brief Summary: Patient is a 83 y.o.  female with history of dementia, seizure disorder, HFpEF, HTN who presented with headache/confusion-she had a episode of vomiting in the ED with suspected aspiration event.  She was found to have acute metabolic encephalopathy in the setting of aspiration pneumonitis and Streptococcus bacteremia.   Significant events: 8/8>> admit to [facility]   Significant studies: 8/8>> CXR: Left> right lung base opacities. 8/8>> x-ray abdomen: Nonobstructive bowel gas pattern. 8/9>> MRI brain: No acute intracranial pathology. 8/9>> EEG: No seizures. 8/10>> TTE: EF 60-65%.   Significant microbiology data: 8/8>> COVID/influenza/RSV PCR: Negative 8/8>> respiratory virus panel: Negative 8/8>> blood culture: Streptococcus parasanguinous. 8/8>> blood culture: Streptococcus salivarius. 8/9>> blood culture: No growth   Procedures: None   Consults: ID  Brief Hospital Course: Acute metabolic encephalopathy Likely secondary to PNA/bacteremia Resolved-back to baseline. MRI brain/EEG stable.   Aspiration pneumonia Aspiration event in the ED/with EMS Initially on Unasyn -has been transitioned to oral antibiotics-which he has completed on  8/13. Evaluated by SLP-tolerating dysphagia 3 diet.   Streptococcus bacteremia Appreciate ID input-given discordant blood culture results on the same day-this perhaps could be a contaminant.  Irrespective-on treatment-last day of antibiotics 8/13.  Does not need any further antibiotics on discharge. Note per ID-no need for TEE.   Candidal intertrigo/superimposed cellulitis Improved. Has completed a course of antibiotics on 8/13.   HTN Stable Verapamil  Lisinopril /HCTZ on hold-PCP to resume when able.   Chronic HFpEF Euvolemic As needed Demadex  for edema/weight gain.   Seizure disorder Keppra -noncompliant at home.   Debility/deconditioning PT recommending SNF-however family refusing and wanted to take patient home with maximum home health services.   Class 3 Obesity: Estimated body mass index is 52.33 kg/m as calculated from the following:   Height as of this encounter: 5' 3 (1.6 m).   Weight as of this encounter: 134 kg.    Discharge Diagnoses:  Principal Problem:   Aspiration pneumonia (HCC) Active Problems:   Essential hypertension   Cellulitis of trunk   Acute metabolic encephalopathy   Seizure (HCC)   Obesity   Candidal skin infection   Streptococcal bacteremia   Discharge Instructions:  Activity:  As tolerated with Full fall precautions use walker/cane & assistance as needed   Discharge Instructions     Call MD for:  extreme fatigue   Complete by: As directed    Call MD for:  persistant dizziness or light-headedness   Complete by: As directed    Diet - low sodium heart healthy   Complete by: As directed    Discharge instructions   Complete by: As directed  Follow with Primary MD  Shelda Atlas, MD in 1-2 weeks  Please get a complete blood count and chemistry panel checked by your Primary MD at your next visit, and again as instructed by your Primary MD.  Get Medicines reviewed and adjusted: Please take all your medications with you for your  next visit with your Primary MD  Laboratory/radiological data: Please request your Primary MD to go over all hospital tests and procedure/radiological results at the follow up, please ask your Primary MD to get all Hospital records sent to his/her office.  In some cases, they will be blood work, cultures and biopsy results pending at the time of your discharge. Please request that your primary care M.D. follows up on these results.  Also Note the following: If you experience worsening of your admission symptoms, develop shortness of breath, life threatening emergency, suicidal or homicidal thoughts you must seek medical attention immediately by calling 911 or calling your MD immediately  if symptoms less severe.  You must read complete instructions/literature along with all the possible adverse reactions/side effects for all the Medicines you take and that have been prescribed to you. Take any new Medicines after you have completely understood and accpet all the possible adverse reactions/side effects.   Do not drive when taking Pain medications or sleeping medications (Benzodaizepines)  Do not take more than prescribed Pain, Sleep and Anxiety Medications. It is not advisable to combine anxiety,sleep and pain medications without talking with your primary care practitioner  Special Instructions: If you have smoked or chewed Tobacco  in the last 2 yrs please stop smoking, stop any regular Alcohol  and or any Recreational drug use.  Wear Seat belts while driving.  Please note: You were cared for by a hospitalist during your hospital stay. Once you are discharged, your primary care physician will handle any further medical issues. Please note that NO REFILLS for any discharge medications will be authorized once you are discharged, as it is imperative that you return to your primary care physician (or establish a relationship with a primary care physician if you do not have one) for your post  hospital discharge needs so that they can reassess your need for medications and monitor your lab values.   Increase activity slowly   Complete by: As directed       Allergies as of 06/16/2024   No Known Allergies      Medication List     PAUSE taking these medications    lisinopril -hydrochlorothiazide  20-12.5 MG tablet Wait to take this until your doctor or other care provider tells you to start again. Commonly known as: ZESTORETIC  Take 1 tablet by mouth daily with lunch.       TAKE these medications    acetaminophen  500 MG tablet Commonly known as: TYLENOL  Take 1,000 mg by mouth every 6 (six) hours as needed for mild pain (pain score 1-3) or headache.   artificial tears ophthalmic solution Place 1 drop into both eyes daily as needed for dry eyes.   aspirin  EC 81 MG tablet Take 81 mg by mouth daily with lunch.   aspirin -acetaminophen -caffeine 250-250-65 MG tablet Commonly known as: EXCEDRIN MIGRAINE Take 1 tablet by mouth every 6 (six) hours as needed for headache.   hydrOXYzine  25 MG tablet Commonly known as: ATARAX  Take 25 mg by mouth 2 (two) times daily as needed.   levETIRAcetam  500 MG tablet Commonly known as: KEPPRA  Take 500 mg by mouth daily with lunch.   loratadine  10 MG  tablet Commonly known as: CLARITIN  Take 10 mg by mouth daily as needed for allergies or rhinitis.   Multi-Vitamin tablet Take 1 tablet by mouth daily with lunch.   nystatin  powder Commonly known as: MYCOSTATIN /NYSTOP  Apply 1 application topically daily as needed (itching/rash).   potassium chloride  10 MEQ tablet Commonly known as: KLOR-CON  M Take 1 tablet (10 mEq total) by mouth daily. What changed:  when to take this reasons to take this additional instructions   torsemide  20 MG tablet Commonly known as: DEMADEX  Take 20-40 mg by mouth daily as needed (fluid and edema). Take one tablet by mouth daily as needed along with potassium.   traMADol  50 MG tablet Commonly known  as: ULTRAM  Take 50 mg by mouth every 6 (six) hours as needed for moderate pain (pain score 4-6).   triamcinolone cream 0.1 % Commonly known as: KENALOG Apply 1 Application topically 2 (two) times daily as needed (itching).   verapamil  180 MG CR tablet Commonly known as: CALAN -SR Take 180 mg by mouth daily with lunch.   Vitamin D  (Ergocalciferol ) 1.25 MG (50000 UNIT) Caps capsule Commonly known as: DRISDOL Take 50,000 Units by mouth every Sunday.        Follow-up Information     [facility] Follow up.   Specialty: Home Health Services Why: Home health has been arranged. They will contact you to schedule apt within 48hrs post discharge. Contact information: [address] [place] [address] [phone]         Avbuere, Edwin, MD. Schedule an appointment as soon as possible for a visit in 1 week(s).   Specialty: Internal Medicine Contact information: [address] [place] [address] [phone]                No Known Allergies   Other Procedures/Studies: ECHOCARDIOGRAM COMPLETE Result Date: 06/12/2024    ECHOCARDIOGRAM REPORT   Patient Name:   Kamaree J Brandenberger Date of Exam: 06/12/2024 Medical Rec #:  [id number]       Height:       63.0 in Accession #:    [id number]      Weight:       295.4 lb Date of Birth:  12/15/1940        BSA:          2.282 m Patient Age:    83 years        BP:           141/77 mmHg Patient Gender: F               HR:           79  bpm. Exam Location:  Inpatient Procedure: 2D Echo, Cardiac Doppler and Color Doppler (Both Spectral and Color            Flow Doppler were utilized during procedure). Indications:    Endocarditis  History:        Patient has prior history of Echocardiogram examinations.                 Endocarditis; Risk Factors:Hypertension.  Sonographer:    Vella Key Referring Phys: [id number] [facility]  Sonographer Comments: Technically difficult study due to poor echo windows, Technically  challenging study due to limited acoustic windows, suboptimal parasternal window, suboptimal apical window, suboptimal subcostal window and patient is obese. Image acquisition challenging due to patient body habitus. IMPRESSIONS  1. Left ventricular ejection fraction, by estimation, is 60 to 65%. The left ventricle  has normal function. Left ventricular endocardial border not optimally defined to evaluate regional wall motion. There is mild left ventricular hypertrophy. Left ventricular diastolic parameters are consistent with Grade I diastolic dysfunction (impaired relaxation). Elevated left ventricular end-diastolic pressure.  2. Right ventricular systolic function is not well visualized but appears to be normal. The right ventricular size is normal. Tricuspid regurgitation signal is inadequate for assessing PA pressure.  3. The mitral valve is grossly normal. No evidence of mitral valve regurgitation. No evidence of mitral stenosis.  4. The aortic valve was not well visualized. Aortic valve regurgitation is mild. No aortic stenosis is present.  5. Aortic dilatation noted. There is borderline dilatation of the ascending aorta, measuring 39 mm. Conclusion(s)/Recommendation(s): Poor acoustic windows. Inadequate visualization of cardiac valves. Consider TEE, if clinically indicated, to r/o infective endocarditis. FINDINGS  Left Ventricle: Left ventricular ejection fraction, by estimation, is 60 to 65%. The left ventricle has normal function. Left ventricular endocardial border not optimally defined to evaluate regional wall motion. Strain was performed and the global longitudinal strain is indeterminate. The left ventricular internal cavity size was normal in size. There is mild left ventricular hypertrophy. Left ventricular diastolic parameters are consistent with Grade I diastolic dysfunction (impaired relaxation).  Elevated left ventricular end-diastolic pressure. Right Ventricle: The right ventricular size is  normal. No increase in right ventricular wall thickness. Right ventricular systolic function is not well visualized but appears to be normal. Tricuspid regurgitation signal is inadequate for assessing PA pressure. Left Atrium: Left atrial size was normal in size. Right Atrium: Right atrial size was normal in size. Pericardium: There is no evidence of pericardial effusion. Mitral Valve: The mitral valve is grossly normal. No evidence of mitral valve regurgitation. No evidence of mitral valve stenosis. Tricuspid Valve: The tricuspid valve is not well visualized. Tricuspid valve regurgitation is not demonstrated. No evidence of tricuspid stenosis. Aortic Valve: The aortic valve was not well visualized. Aortic valve regurgitation is mild. No aortic stenosis is present. Pulmonic Valve: The pulmonic valve was not well visualized. Pulmonic valve regurgitation is trivial. No evidence of pulmonic stenosis. Aorta: The aortic root is normal in size and structure and aortic dilatation noted. There is borderline dilatation of the ascending aorta, measuring 39 mm. Venous: The inferior vena cava was not well visualized. IAS/Shunts: No atrial level shunt detected by color flow Doppler. Additional Comments: 3D was performed not requiring image post processing on an independent workstation and was indeterminate.  LEFT VENTRICLE PLAX 2D LVIDd:         4.40 cm   Diastology LVIDs:         3.10 cm   LV e' medial:    5.66 cm/s LV PW:         1.20 cm   LV E/e' medial:  15.0 LV IVS:        1.20 cm   LV e' lateral:   4.79 cm/s LVOT diam:     1.80 cm   LV E/e' lateral: 17.7 LV SV:         51 LV SV Index:   23 LVOT Area:     2.54 cm  LEFT ATRIUM             Index LA diam:        3.20 cm 1.40 cm/m LA Vol (A2C):   84.8 ml 37.16 ml/m LA Vol (A4C):   43.2 ml 18.93 ml/m LA Biplane Vol: 64.5 ml 28.26 ml/m  AORTIC VALVE LVOT Vmax:  103.00 cm/s LVOT Vmean:  66.300 cm/s LVOT VTI:    0.202 m  AORTA Ao Root diam: 3.60 cm Ao Asc diam:  3.90 cm  MITRAL VALVE MV Area (PHT): 5.34 cm    SHUNTS MV Decel Time: 142 msec    Systemic VTI:  0.20 m MV E velocity: 84.90 cm/s  Systemic Diam: 1.80 cm MV A velocity: 94.30 cm/s MV E/A ratio:  0.90 Vishnu Priya Mallipeddi Electronically signed by Diannah Late Mallipeddi Signature Date/Time: 06/12/2024/4:04:57 PM    Final    MR BRAIN WO CONTRAST Result Date: 06/11/2024 CLINICAL DATA:  Initial evaluation for new onset headache. Mental status change. EXAM: MRI HEAD WITHOUT CONTRAST TECHNIQUE: Multiplanar, multiecho pulse sequences of the brain and surrounding structures were obtained without intravenous contrast. COMPARISON:  CT from 06/10/2024. FINDINGS: Brain: Cerebral volume overall within normal limits for age. Scattered patchy T2/FLAIR hyperintensity involving the periventricular and deep white matter both cerebral hemispheres, most characteristic of chronic microvascular ischemic disease, mild for age. No abnormal foci of restricted diffusion to suggest acute or subacute ischemia. No areas of chronic cortical infarction or other insult. No acute intracranial hemorrhage. Few scattered chronic micro hemorrhages noted, most notably about the thalami, most characteristic of likely hypertensive in nature. No mass lesion, midline shift or mass effect. No hydrocephalus or extra-axial fluid collection. Markedly expanded and empty sella noted, stable as compared to previous MRI from 2015. Vascular: Major intracranial vascular flow voids are maintained. Skull and upper cervical spine: Craniocervical junction within normal limits. Reversal of the normal upper cervical lordosis noted. Bone marrow signal intensity within normal limits. No scalp soft tissue abnormality. Sinuses/Orbits: Globes orbital soft tissues within normal limits. Paranasal sinuses are largely clear. Moderate to large left greater than right mastoid effusions, of uncertain significance. Image nasopharynx unremarkable. Other: None. IMPRESSION: 1. No acute  intracranial abnormality. 2. Mild chronic microvascular ischemic disease for age. 3. Markedly expanded and empty sella, stable as compared to previous MRI from 2015. 4. Moderate to large left greater than right mastoid effusions, of uncertain significance. Correlation with physical exam recommended. Electronically Signed   By: Morene Hoard M.D.   On: 06/11/2024 18:41   EEG adult Result Date: 06/11/2024 Shelton Arlin KIDD, MD     06/11/2024  5:21 PM Patient Name: Carol Martin MRN: [id number] Epilepsy Attending: Arlin KIDD Shelton Referring Physician/Provider: Rojelio Nest, DO Date: 06/11/2024 Duration: 22.30 mins Patient history: 83yo F with ams. EEG to evaluate for seizure. Level of alertness: Awake AEDs during EEG study: LEV Technical aspects: This EEG study was done with scalp electrodes positioned according to the 10-20 International system of electrode placement. Electrical activity was reviewed with band pass filter of 1-70Hz , sensitivity of 7 uV/mm, display speed of 27mm/sec with a 60Hz  notched filter applied as appropriate. EEG data were recorded continuously and digitally stored.  Video monitoring was available and reviewed as appropriate. Description: The posterior dominant rhythm consists of 8Hz  activity of moderate voltage (25-35 uV) seen predominantly in posterior head regions, symmetric and reactive to eye opening and eye closing. EEG showed intermittent generalized polymorphic sharply contoured 3 to 6 Hz theta-delta slowing. Hyperventilation and photic stimulation were not performed.   ABNORMALITY - Intermittent slow, generalized IMPRESSION: This study is suggestive of mild diffuse encephalopathy. No seizures or definite epileptiform discharges were seen throughout the recording. Arlin KIDD Shelton   DG Abd 1 View Result Date: 06/10/2024 CLINICAL DATA:  [id number].  Intractable nausea and vomiting. EXAM: ABDOMEN - 1 VIEW COMPARISON:  None  Available. FINDINGS: The bowel gas pattern is nonobstructive  mild-to-moderate fecal stasis. No radio-opaque calculi or other significant radiographic abnormality are seen. There is degenerative change of the lumbar spine. IMPRESSION: Nonobstructive bowel gas pattern with mild-to-moderate fecal stasis. Electronically Signed   By: Francis Quam M.D.   On: 06/10/2024 05:44   CT Head Wo Contrast Result Date: 06/10/2024 CLINICAL DATA:  Headache, new onset (Age >= 51y) headache, vomiting EXAM: CT HEAD WITHOUT CONTRAST TECHNIQUE: Contiguous axial images were obtained from the base of the skull through the vertex without intravenous contrast. RADIATION DOSE REDUCTION: This exam was performed according to the departmental dose-optimization program which includes automated exposure control, adjustment of the mA and/or kV according to patient size and/or use of iterative reconstruction technique. COMPARISON:  CT head 08/25/2023 FINDINGS: Brain: No evidence of large-territorial acute infarction. No parenchymal hemorrhage. No mass lesion. No extra-axial collection. No mass effect or midline shift. No hydrocephalus. Basilar cisterns are patent. 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 Vascular: No hyperdense vessel. Skull: No acute fracture or focal lesion. Sinuses/Orbits: Paranasal sinuses and mastoid air cells are clear. The orbits are unremarkable. Other: None. IMPRESSION: 1. No acute intracranial abnormality. 2. 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 Electronically Signed   By: Morgane  Naveau M.D.   On: 06/10/2024 03:09   DG Chest Port 1 View Result Date: 06/10/2024 CLINICAL DATA:  Vomiting an aspiration risk, headache, nausea EXAM: PORTABLE CHEST 1 VIEW COMPARISON:  04/09/2024 FINDINGS: Stable enlargement of the cardiomediastinal silhouette. Airspace and interstitial opacities in the left greater than right lung bases. No definite pleural effusion. No pneumothorax. IMPRESSION: Airspace and interstitial opacities in the left greater than right lung bases  may be due to aspiration or pneumonia. Electronically Signed   By: Norman Gatlin M.D.   On: 06/10/2024 02:17     TODAY-DAY OF DISCHARGE:  Subjective:   Fumi Guadron today has no headache,no chest abdominal pain,no new weakness tingling or numbness, feels much better wants to go home today.   Objective:   Blood pressure 120/68, pulse 69, temperature 98.4 F (36.9 C), temperature source Oral, resp. rate 16, height 5' 3 (1.6 m), weight 134 kg, SpO2 (!) 89%.  Intake/Output Summary (Last 24 hours) at 06/16/2024 0848 Last data filed at 06/16/2024 0600 Gross per 24 hour  Intake 240 ml  Output 1200 ml  Net -960 ml   Filed Weights   06/10/24 0142  Weight: 134 kg    Exam: Awake Alert, Oriented *3, No new F.N deficits, Normal affect [place].AT,PERRAL Supple Neck,No JVD, No cervical lymphadenopathy appriciated.  Symmetrical Chest wall movement, Good air movement bilaterally, CTAB RRR,No Gallops,Rubs or new Murmurs, No Parasternal Heave +ve B.Sounds, Abd Soft, Non tender, No organomegaly appriciated, No rebound -guarding or rigidity. No Cyanosis, Clubbing or edema, No new Rash or bruise   PERTINENT RADIOLOGIC STUDIES: No results found.   PERTINENT LAB RESULTS: CBC: No results for input(s): WBC, HGB, HCT, PLT in the last 72 hours. CMET CMP     Component Value Date/Time   NA 138 06/12/2024 0806   NA 141 07/11/2020 1127   K 4.4 06/12/2024 0806   CL 103 06/12/2024 0806   CO2 26 06/12/2024 0806   GLUCOSE 105 (H) 06/12/2024 0806   BUN 12 06/12/2024 0806   BUN 5 (L) 07/11/2020 1127   CREATININE 0.72 06/12/2024 0806   CREATININE 0.75 06/27/2021 0000   CALCIUM 8.6 (L) 06/12/2024 0806   PROT 6.4 (L) 06/10/2024 1258   ALBUMIN 3.0 (  L) 06/10/2024 1258   AST 23 06/10/2024 1258   ALT 10 06/10/2024 1258   ALKPHOS 71 06/10/2024 1258   BILITOT 1.1 06/10/2024 1258   EGFR 81 06/27/2021 0000   GFRNONAA >60 06/12/2024 0806    GFR Estimated Creatinine Clearance: 72.8 mL/min (by  C-G formula based on SCr of 0.72 mg/dL). No results for input(s): LIPASE, AMYLASE in the last 72 hours. No results for input(s): CKTOTAL, CKMB, CKMBINDEX, TROPONINI in the last 72 hours. Invalid input(s): POCBNP No results for input(s): DDIMER in the last 72 hours. No results for input(s): HGBA1C in the last 72 hours. No results for input(s): CHOL, HDL, LDLCALC, TRIG, CHOLHDL, LDLDIRECT in the last 72 hours. No results for input(s): TSH, T4TOTAL, T3FREE, THYROIDAB in the last 72 hours.  Invalid input(s): FREET3 No results for input(s): VITAMINB12, FOLATE, FERRITIN, TIBC, IRON, RETICCTPCT in the last 72 hours. Coags: No results for input(s): INR in the last 72 hours.  Invalid input(s): PT Microbiology: Recent Results (from the past 240 hours)  Resp panel by RT-PCR (RSV, Flu A&B, Covid) Anterior Nasal Swab     Status: None   Collection Time: 06/10/24  1:55 AM   Specimen: Anterior Nasal Swab  Result Value Ref Range Status   SARS Coronavirus 2 by RT PCR NEGATIVE NEGATIVE Final   Influenza A by PCR NEGATIVE NEGATIVE Final   Influenza B by PCR NEGATIVE NEGATIVE Final    Comment: (NOTE) The Xpert Xpress SARS-CoV-2/FLU/RSV plus assay is intended as an aid in the diagnosis of influenza from Nasopharyngeal swab specimens and should not be used as a sole basis for treatment. Nasal washings and aspirates are unacceptable for Xpert Xpress SARS-CoV-2/FLU/RSV testing.  Fact Sheet for Patients: BloggerCourse.com  Fact Sheet for Healthcare Providers: SeriousBroker.it  This test is not yet approved or cleared by the [place]  FDA and has been authorized for detection and/or diagnosis of SARS-CoV-2 by FDA under an Emergency Use Authorization (EUA). This EUA will remain in effect (meaning this test can be used) for the duration of the COVID-19 declaration under Section 564(b)(1) of the  Act, 21 U.S.C. section 360bbb-3(b)(1), unless the authorization is terminated or revoked.     Resp Syncytial Virus by PCR NEGATIVE NEGATIVE Final    Comment: (NOTE) Fact Sheet for Patients: BloggerCourse.com  Fact Sheet for Healthcare Providers: SeriousBroker.it  This test is not yet approved or cleared by the [place]  FDA and has been authorized for detection and/or diagnosis of SARS-CoV-2 by FDA under an Emergency Use Authorization (EUA). This EUA will remain in effect (meaning this test can be used) for the duration of the COVID-19 declaration under Section 564(b)(1) of the Act, 21 U.S.C. section 360bbb-3(b)(1), unless the authorization is terminated or revoked.  Performed at [facility], [address] [place], [place] [address]   Respiratory (~20 pathogens) panel by PCR     Status: None   Collection Time: 06/10/24  1:55 AM   Specimen: Nasopharyngeal Swab; Respiratory  Result Value Ref Range Status   Adenovirus NOT DETECTED NOT DETECTED Final   Coronavirus 229E NOT DETECTED NOT DETECTED Final    Comment: (NOTE) The Coronavirus on the Respiratory Panel, DOES NOT test for the novel  Coronavirus (2019 nCoV)    Coronavirus HKU1 NOT DETECTED NOT DETECTED Final   Coronavirus NL63 NOT DETECTED NOT DETECTED Final   Coronavirus OC43 NOT DETECTED NOT DETECTED Final   Metapneumovirus NOT DETECTED NOT DETECTED Final   Rhinovirus / Enterovirus NOT DETECTED NOT DETECTED Final  Influenza A NOT DETECTED NOT DETECTED Final   Influenza B NOT DETECTED NOT DETECTED Final   Parainfluenza Virus 1 NOT DETECTED NOT DETECTED Final   Parainfluenza Virus 2 NOT DETECTED NOT DETECTED Final   Parainfluenza Virus 3 NOT DETECTED NOT DETECTED Final   Parainfluenza Virus 4 NOT DETECTED NOT DETECTED Final   Respiratory Syncytial Virus NOT DETECTED NOT DETECTED Final   Bordetella pertussis NOT DETECTED NOT DETECTED Final   Bordetella  Parapertussis NOT DETECTED NOT DETECTED Final   Chlamydophila pneumoniae NOT DETECTED NOT DETECTED Final   Mycoplasma pneumoniae NOT DETECTED NOT DETECTED Final    Comment: Performed at [facility], [address], [place], [place] [address]  Blood culture (routine x 2)     Status: Abnormal   Collection Time: 06/10/24  3:34 AM   Specimen: BLOOD RIGHT HAND  Result Value Ref Range Status   Specimen Description BLOOD RIGHT HAND  Final   Special Requests   Final    BOTTLES DRAWN AEROBIC AND ANAEROBIC Blood Culture adequate volume   Culture  Setup Time   Final    GRAM POSITIVE COCCI IN CHAINS AEROBIC BOTTLE ONLY CRITICAL VALUE NOTED.  VALUE IS CONSISTENT WITH PREVIOUSLY REPORTED AND CALLED VALUE. Performed at [facility], [address] [place], [place] [address]    Culture STREPTOCOCCUS PARASANGUINIS (A)  Final   Report Status 06/12/2024 FINAL  Final   Organism ID, Bacteria STREPTOCOCCUS PARASANGUINIS  Final      Susceptibility   Streptococcus parasanguinis - MIC*    PENICILLIN <=0.06 SENSITIVE Sensitive     CEFTRIAXONE  <=0.12 SENSITIVE Sensitive     LEVOFLOXACIN 2 SENSITIVE Sensitive     VANCOMYCIN  0.5 SENSITIVE Sensitive     * STREPTOCOCCUS PARASANGUINIS  Blood culture (routine x 2)     Status: Abnormal   Collection Time: 06/10/24  3:34 AM   Specimen: BLOOD  Result Value Ref Range Status   Specimen Description BLOOD RIGHT ANTECUBITAL  Final   Special Requests   Final    BOTTLES DRAWN AEROBIC AND ANAEROBIC Blood Culture adequate volume   Culture  Setup Time   Final    GRAM POSITIVE COCCI IN PAIRS IN CHAINS ANAEROBIC BOTTLE ONLY CRITICAL RESULT CALLED TO, READ BACK BY AND VERIFIED WITH: MAYA PRENTICE POISSON [id number] AT 2036 BY EC Performed at [facility], [address], [place], [place] [address]    Culture STREPTOCOCCUS SALIVARIUS (A)  Final   Report Status 06/14/2024 FINAL  Final   Organism ID, Bacteria STREPTOCOCCUS SALIVARIUS  Final      Susceptibility    Streptococcus salivarius - MIC*    PENICILLIN 0.12 SENSITIVE Sensitive     CEFTRIAXONE  <=0.12 SENSITIVE Sensitive     ERYTHROMYCIN <=0.12 SENSITIVE Sensitive     LEVOFLOXACIN 4 INTERMEDIATE Intermediate     VANCOMYCIN  1 SENSITIVE Sensitive     * STREPTOCOCCUS SALIVARIUS  Blood Culture ID Panel (Reflexed)     Status: Abnormal   Collection Time: 06/10/24  3:34 AM  Result Value Ref Range Status   Enterococcus faecalis NOT DETECTED NOT DETECTED Final   Enterococcus Faecium NOT DETECTED NOT DETECTED Final   Listeria monocytogenes NOT DETECTED NOT DETECTED Final   Staphylococcus species NOT DETECTED NOT DETECTED Final   Staphylococcus aureus (BCID) NOT DETECTED NOT DETECTED Final   Staphylococcus epidermidis NOT DETECTED NOT DETECTED Final   Staphylococcus lugdunensis NOT DETECTED NOT DETECTED Final   Streptococcus species DETECTED (A) NOT DETECTED Final  Comment: Not Enterococcus species, Streptococcus agalactiae, Streptococcus pyogenes, or Streptococcus pneumoniae. CRITICAL RESULT CALLED TO, READ BACK BY AND VERIFIED WITH: MAYA PRENTICE POISSON [id number] AT 2034 BY EC    Streptococcus agalactiae NOT DETECTED NOT DETECTED Final   Streptococcus pneumoniae NOT DETECTED NOT DETECTED Final   Streptococcus pyogenes NOT DETECTED NOT DETECTED Final   A.calcoaceticus-baumannii NOT DETECTED NOT DETECTED Final   Bacteroides fragilis NOT DETECTED NOT DETECTED Final   Enterobacterales NOT DETECTED NOT DETECTED Final   Enterobacter cloacae complex NOT DETECTED NOT DETECTED Final   Escherichia coli NOT DETECTED NOT DETECTED Final   Klebsiella aerogenes NOT DETECTED NOT DETECTED Final   Klebsiella oxytoca NOT DETECTED NOT DETECTED Final   Klebsiella pneumoniae NOT DETECTED NOT DETECTED Final   Proteus species NOT DETECTED NOT DETECTED Final   Salmonella species NOT DETECTED NOT DETECTED Final   Serratia marcescens NOT DETECTED NOT DETECTED Final   Haemophilus influenzae NOT DETECTED NOT DETECTED  Final   Neisseria meningitidis NOT DETECTED NOT DETECTED Final   Pseudomonas aeruginosa NOT DETECTED NOT DETECTED Final   Stenotrophomonas maltophilia NOT DETECTED NOT DETECTED Final   Candida albicans NOT DETECTED NOT DETECTED Final   Candida auris NOT DETECTED NOT DETECTED Final   Candida glabrata NOT DETECTED NOT DETECTED Final   Candida krusei NOT DETECTED NOT DETECTED Final   Candida parapsilosis NOT DETECTED NOT DETECTED Final   Candida tropicalis NOT DETECTED NOT DETECTED Final   Cryptococcus neoformans/gattii NOT DETECTED NOT DETECTED Final    Comment: Performed at [facility], [address], [place], [place] [address]  Culture, blood (Routine X 2) w Reflex to ID Panel     Status: None   Collection Time: 06/11/24  2:52 PM   Specimen: BLOOD LEFT ARM  Result Value Ref Range Status   Specimen Description BLOOD LEFT ARM  Final   Special Requests   Final    BOTTLES DRAWN AEROBIC AND ANAEROBIC Blood Culture results may not be optimal due to an inadequate volume of blood received in culture bottles   Culture   Final    NO GROWTH 5 DAYS Performed at [facility], [address] [place], [place] [address]    Report Status 06/16/2024 FINAL  Final  Culture, blood (Routine X 2) w Reflex to ID Panel     Status: None   Collection Time: 06/11/24  2:56 PM   Specimen: BLOOD LEFT HAND  Result Value Ref Range Status   Specimen Description BLOOD LEFT HAND  Final   Special Requests   Final    BOTTLES DRAWN AEROBIC ONLY Blood Culture results may not be optimal due to an inadequate volume of blood received in culture bottles   Culture   Final    NO GROWTH 5 DAYS Performed at [facility], [address], [place], [place] [address]    Report Status 06/16/2024 FINAL  Final    FURTHER DISCHARGE INSTRUCTIONS:  Get Medicines reviewed and adjusted: Please take all your medications with you for your next visit with your Primary MD  Laboratory/radiological data: Please  request your Primary MD to go over all hospital tests and procedure/radiological results at the follow up, please ask your Primary MD to get all Hospital records sent to his/her office.  In some cases, they will be blood work, cultures and biopsy results pending at the time of your discharge. Please request that your primary care M.D. goes through all the records of your hospital data and follows  up on these results.  Also Note the following: If you experience worsening of your admission symptoms, develop shortness of breath, life threatening emergency, suicidal or homicidal thoughts you must seek medical attention immediately by calling 911 or calling your MD immediately  if symptoms less severe.  You must read complete instructions/literature along with all the possible adverse reactions/side effects for all the Medicines you take and that have been prescribed to you. Take any new Medicines after you have completely understood and accpet all the possible adverse reactions/side effects.   Do not drive when taking Pain medications or sleeping medications (Benzodaizepines)  Do not take more than prescribed Pain, Sleep and Anxiety Medications. It is not advisable to combine anxiety,sleep and pain medications without talking with your primary care practitioner  Special Instructions: If you have smoked or chewed Tobacco  in the last 2 yrs please stop smoking, stop any regular Alcohol  and or any Recreational drug use.  Wear Seat belts while driving.  Please note: You were cared for by a hospitalist during your hospital stay. Once you are discharged, your primary care physician will handle any further medical issues. Please note that NO REFILLS for any discharge medications will be authorized once you are discharged, as it is imperative that you return to your primary care physician (or establish a relationship with a primary care physician if you do not have one) for your post hospital discharge needs  so that they can reassess your need for medications and monitor your lab values.  Total Time spent coordinating discharge including counseling, education and face to face time equals greater than 30 minutes.  SignedBETHA Donalda Applebaum 06/16/2024 8:48 AM
